# Patient Record
Sex: Female | Born: 1970
Health system: Southern US, Community
[De-identification: ages and names within clinical notes are randomized; demographics above are authoritative.]

## PROBLEM LIST (undated history)

## (undated) ENCOUNTER — Inpatient Hospital Stay (HOSPITAL_COMMUNITY): Payer: Self-pay

## (undated) ENCOUNTER — Ambulatory Visit (HOSPITAL_COMMUNITY)
Admission: EM | Disposition: A | Discharge status: Home / Self Care | Payer: No Typology Code available for payment source

## (undated) DIAGNOSIS — D696 Thrombocytopenia, unspecified: Secondary | ICD-10-CM

## (undated) DIAGNOSIS — IMO0002 Reserved for concepts with insufficient information to code with codable children: Secondary | ICD-10-CM

## (undated) DIAGNOSIS — R87629 Unspecified abnormal cytological findings in specimens from vagina: Secondary | ICD-10-CM

## (undated) DIAGNOSIS — R519 Headache, unspecified: Secondary | ICD-10-CM

## (undated) DIAGNOSIS — K759 Inflammatory liver disease, unspecified: Secondary | ICD-10-CM

## (undated) DIAGNOSIS — K861 Other chronic pancreatitis: Secondary | ICD-10-CM

## (undated) DIAGNOSIS — K760 Fatty (change of) liver, not elsewhere classified: Secondary | ICD-10-CM

## (undated) DIAGNOSIS — K859 Acute pancreatitis without necrosis or infection, unspecified: Secondary | ICD-10-CM

## (undated) HISTORY — DX: Reserved for concepts with insufficient information to code with codable children: IMO0002

## (undated) HISTORY — DX: Headache, unspecified: R51.9

## (undated) HISTORY — PX: COLONOSCOPY: SHX174

## (undated) HISTORY — PX: BREAST EXCISIONAL BIOPSY: SUR124

## (undated) HISTORY — DX: Other chronic pancreatitis: K86.1

## (undated) HISTORY — PX: BREAST SURGERY: SHX581

## (undated) HISTORY — DX: Thrombocytopenia, unspecified: D69.6

## (undated) HISTORY — DX: Unspecified abnormal cytological findings in specimens from vagina: R87.629

## (undated) HISTORY — DX: Fatty (change of) liver, not elsewhere classified: K76.0

---
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
## 1994-02-22 DIAGNOSIS — IMO0002 Reserved for concepts with insufficient information to code with codable children: Secondary | ICD-10-CM

## 1994-02-22 DIAGNOSIS — R87619 Unspecified abnormal cytological findings in specimens from cervix uteri: Secondary | ICD-10-CM

## 1994-02-22 HISTORY — DX: Unspecified abnormal cytological findings in specimens from cervix uteri: R87.619

## 1994-02-22 HISTORY — DX: Reserved for concepts with insufficient information to code with codable children: IMO0002

## 1997-07-18 ENCOUNTER — Ambulatory Visit (HOSPITAL_COMMUNITY): Admission: RE | Admit: 1997-07-18 | Discharge: 1997-07-18 | Payer: Self-pay | Admitting: General Surgery

## 1999-12-14 ENCOUNTER — Encounter: Payer: Self-pay | Admitting: Emergency Medicine

## 1999-12-14 ENCOUNTER — Emergency Department (HOSPITAL_COMMUNITY): Admission: EM | Admit: 1999-12-14 | Discharge: 1999-12-14 | Payer: Self-pay | Admitting: Emergency Medicine

## 1999-12-19 ENCOUNTER — Emergency Department (HOSPITAL_COMMUNITY): Admission: EM | Admit: 1999-12-19 | Discharge: 1999-12-19 | Payer: Self-pay | Admitting: Emergency Medicine

## 1999-12-27 ENCOUNTER — Inpatient Hospital Stay (HOSPITAL_COMMUNITY): Admission: AD | Admit: 1999-12-27 | Discharge: 1999-12-27 | Payer: Self-pay | Admitting: Gynecology

## 2000-01-08 ENCOUNTER — Other Ambulatory Visit: Admission: RE | Admit: 2000-01-08 | Discharge: 2000-01-08 | Payer: Self-pay | Admitting: Gynecology

## 2000-04-12 ENCOUNTER — Ambulatory Visit (HOSPITAL_COMMUNITY): Admission: RE | Admit: 2000-04-12 | Discharge: 2000-04-12 | Payer: Self-pay | Admitting: *Deleted

## 2000-04-13 ENCOUNTER — Encounter: Admission: RE | Admit: 2000-04-13 | Discharge: 2000-04-13 | Payer: Self-pay | Admitting: Obstetrics & Gynecology

## 2000-05-04 ENCOUNTER — Encounter: Admission: RE | Admit: 2000-05-04 | Discharge: 2000-05-04 | Payer: Self-pay | Admitting: Obstetrics & Gynecology

## 2000-05-18 ENCOUNTER — Encounter: Admission: RE | Admit: 2000-05-18 | Discharge: 2000-05-18 | Payer: Self-pay | Admitting: Obstetrics & Gynecology

## 2000-05-30 ENCOUNTER — Ambulatory Visit (HOSPITAL_COMMUNITY): Admission: RE | Admit: 2000-05-30 | Discharge: 2000-05-30 | Payer: Self-pay | Admitting: Obstetrics & Gynecology

## 2000-06-01 ENCOUNTER — Encounter: Admission: RE | Admit: 2000-06-01 | Discharge: 2000-06-01 | Payer: Self-pay | Admitting: Obstetrics & Gynecology

## 2000-06-15 ENCOUNTER — Encounter: Admission: RE | Admit: 2000-06-15 | Discharge: 2000-06-15 | Payer: Self-pay | Admitting: Obstetrics & Gynecology

## 2000-07-06 ENCOUNTER — Encounter: Admission: RE | Admit: 2000-07-06 | Discharge: 2000-07-06 | Payer: Self-pay | Admitting: Obstetrics

## 2000-07-20 ENCOUNTER — Encounter: Admission: RE | Admit: 2000-07-20 | Discharge: 2000-07-20 | Payer: Self-pay | Admitting: Obstetrics & Gynecology

## 2000-07-27 ENCOUNTER — Encounter: Admission: RE | Admit: 2000-07-27 | Discharge: 2000-07-27 | Payer: Self-pay | Admitting: Obstetrics & Gynecology

## 2000-08-01 ENCOUNTER — Inpatient Hospital Stay (HOSPITAL_COMMUNITY): Admission: AD | Admit: 2000-08-01 | Discharge: 2000-08-03 | Payer: Self-pay | Admitting: *Deleted

## 2000-08-20 ENCOUNTER — Inpatient Hospital Stay (HOSPITAL_COMMUNITY): Admission: AD | Admit: 2000-08-20 | Discharge: 2000-08-20 | Payer: Self-pay | Admitting: *Deleted

## 2003-07-24 ENCOUNTER — Encounter (INDEPENDENT_AMBULATORY_CARE_PROVIDER_SITE_OTHER): Payer: Self-pay | Admitting: *Deleted

## 2003-07-24 LAB — CONVERTED CEMR LAB

## 2004-03-30 ENCOUNTER — Ambulatory Visit: Payer: Self-pay | Admitting: Family Medicine

## 2005-01-04 ENCOUNTER — Other Ambulatory Visit: Admission: RE | Admit: 2005-01-04 | Discharge: 2005-01-04 | Payer: Self-pay | Admitting: Gynecology

## 2005-01-26 ENCOUNTER — Ambulatory Visit (HOSPITAL_COMMUNITY): Admission: RE | Admit: 2005-01-26 | Discharge: 2005-01-26 | Payer: Self-pay | Admitting: Gynecology

## 2005-11-04 ENCOUNTER — Encounter (INDEPENDENT_AMBULATORY_CARE_PROVIDER_SITE_OTHER): Payer: Self-pay | Admitting: Specialist

## 2005-11-04 ENCOUNTER — Ambulatory Visit (HOSPITAL_COMMUNITY): Admission: RE | Admit: 2005-11-04 | Discharge: 2005-11-04 | Payer: Self-pay | Admitting: Gastroenterology

## 2006-01-06 ENCOUNTER — Other Ambulatory Visit: Admission: RE | Admit: 2006-01-06 | Discharge: 2006-01-06 | Payer: Self-pay | Admitting: Gynecology

## 2006-04-21 DIAGNOSIS — G44209 Tension-type headache, unspecified, not intractable: Secondary | ICD-10-CM | POA: Insufficient documentation

## 2006-04-21 DIAGNOSIS — E669 Obesity, unspecified: Secondary | ICD-10-CM | POA: Insufficient documentation

## 2006-04-22 ENCOUNTER — Encounter (INDEPENDENT_AMBULATORY_CARE_PROVIDER_SITE_OTHER): Payer: Self-pay | Admitting: *Deleted

## 2006-07-15 ENCOUNTER — Ambulatory Visit (HOSPITAL_COMMUNITY): Admission: RE | Admit: 2006-07-15 | Discharge: 2006-07-15 | Payer: Self-pay | Admitting: Gynecology

## 2009-06-04 ENCOUNTER — Emergency Department (HOSPITAL_COMMUNITY): Admission: EM | Admit: 2009-06-04 | Discharge: 2009-06-04 | Payer: Self-pay | Admitting: Emergency Medicine

## 2009-06-06 ENCOUNTER — Ambulatory Visit: Payer: Self-pay | Admitting: Obstetrics & Gynecology

## 2009-06-06 ENCOUNTER — Ambulatory Visit (HOSPITAL_COMMUNITY): Admission: RE | Admit: 2009-06-06 | Discharge: 2009-06-06 | Payer: Self-pay | Admitting: Obstetrics & Gynecology

## 2009-07-04 ENCOUNTER — Ambulatory Visit: Payer: Self-pay | Admitting: Obstetrics and Gynecology

## 2010-02-22 HISTORY — PX: ECTOPIC PREGNANCY SURGERY: SHX613

## 2010-05-12 LAB — CBC
HCT: 40.4 % (ref 36.0–46.0)
Hemoglobin: 13.4 g/dL (ref 12.0–15.0)
MCHC: 33.2 g/dL (ref 30.0–36.0)
MCV: 92.6 fL (ref 78.0–100.0)
Platelets: 230 10*3/uL (ref 150–400)
RBC: 4.37 MIL/uL (ref 3.87–5.11)
RDW: 12.4 % (ref 11.5–15.5)
WBC: 7.3 10*3/uL (ref 4.0–10.5)

## 2010-05-12 LAB — DIFFERENTIAL
Basophils Absolute: 0 10*3/uL (ref 0.0–0.1)
Basophils Relative: 0 % (ref 0–1)
Eosinophils Absolute: 0.2 10*3/uL (ref 0.0–0.7)
Eosinophils Relative: 3 % (ref 0–5)
Lymphocytes Relative: 31 % (ref 12–46)
Lymphs Abs: 2.3 10*3/uL (ref 0.7–4.0)
Monocytes Absolute: 0.4 10*3/uL (ref 0.1–1.0)
Monocytes Relative: 6 % (ref 3–12)
Neutro Abs: 4.4 10*3/uL (ref 1.7–7.7)
Neutrophils Relative %: 59 % (ref 43–77)

## 2010-05-12 LAB — HCG, QUANTITATIVE, PREGNANCY: hCG, Beta Chain, Quant, S: 3741 m[IU]/mL — ABNORMAL HIGH (ref <5)

## 2010-05-12 LAB — BUN: BUN: 10 mg/dL (ref 6–23)

## 2010-05-12 LAB — AST: AST: 25 U/L (ref 0–37)

## 2010-05-12 LAB — CREATININE, SERUM
Creatinine, Ser: 0.57 mg/dL (ref 0.4–1.2)
GFR calc Af Amer: >60 mL/min (ref >60)
GFR calc non Af Amer: >60 mL/min (ref >60)

## 2010-05-13 LAB — CBC
HCT: 38.8 % (ref 36.0–46.0)
Hemoglobin: 13.6 g/dL (ref 12.0–15.0)
MCHC: 35.1 g/dL (ref 30.0–36.0)
MCV: 92 fL (ref 78.0–100.0)
Platelets: 212 10*3/uL (ref 150–400)
RBC: 4.22 MIL/uL (ref 3.87–5.11)
RDW: 12.4 % (ref 11.5–15.5)
WBC: 8.9 10*3/uL (ref 4.0–10.5)

## 2010-05-13 LAB — WET PREP, GENITAL
Clue Cells Wet Prep HPF POC: NONE SEEN
Trich, Wet Prep: NONE SEEN
WBC, Wet Prep HPF POC: NONE SEEN
Yeast Wet Prep HPF POC: NONE SEEN

## 2010-05-13 LAB — DIFFERENTIAL
Basophils Absolute: 0.1 10*3/uL (ref 0.0–0.1)
Basophils Relative: 1 % (ref 0–1)
Eosinophils Absolute: 0.1 10*3/uL (ref 0.0–0.7)
Eosinophils Relative: 1 % (ref 0–5)
Lymphocytes Relative: 31 % (ref 12–46)
Lymphs Abs: 2.7 10*3/uL (ref 0.7–4.0)
Monocytes Absolute: 0.5 10*3/uL (ref 0.1–1.0)
Monocytes Relative: 6 % (ref 3–12)
Neutro Abs: 5.4 10*3/uL (ref 1.7–7.7)
Neutrophils Relative %: 61 % (ref 43–77)

## 2010-05-13 LAB — POCT PREGNANCY, URINE: Preg Test, Ur: POSITIVE

## 2010-05-13 LAB — URINALYSIS, ROUTINE W REFLEX MICROSCOPIC
Bilirubin Urine: NEGATIVE
Glucose, UA: NEGATIVE mg/dL
Ketones, ur: 40 mg/dL — AB
Leukocytes, UA: NEGATIVE
Nitrite: NEGATIVE
Protein, ur: NEGATIVE mg/dL
Specific Gravity, Urine: 1.015 (ref 1.005–1.030)
Urobilinogen, UA: 0.2 mg/dL (ref 0.0–1.0)
pH: 5 (ref 5.0–8.0)

## 2010-05-13 LAB — ABO/RH: ABO/RH(D): O POS

## 2010-05-13 LAB — URINE MICROSCOPIC-ADD ON

## 2010-05-13 LAB — POCT I-STAT, CHEM 8
BUN: 7 mg/dL (ref 6–23)
Calcium, Ion: 1.11 mmol/L — ABNORMAL LOW (ref 1.12–1.32)
Chloride: 105 mEq/L (ref 96–112)
Creatinine, Ser: 0.4 mg/dL (ref 0.4–1.2)
Glucose, Bld: 89 mg/dL (ref 70–99)
HCT: 43 % (ref 36.0–46.0)
Hemoglobin: 14.6 g/dL (ref 12.0–15.0)
Potassium: 3.6 mEq/L (ref 3.5–5.1)
Sodium: 139 mEq/L (ref 135–145)
TCO2: 21 mmol/L (ref 0–100)

## 2010-05-13 LAB — GC/CHLAMYDIA PROBE AMP, GENITAL
Chlamydia, DNA Probe: NEGATIVE
GC Probe Amp, Genital: NEGATIVE

## 2010-05-13 LAB — HCG, QUANTITATIVE, PREGNANCY: hCG, Beta Chain, Quant, S: 2856 m[IU]/mL — ABNORMAL HIGH (ref <5)

## 2011-04-14 ENCOUNTER — Encounter: Payer: Self-pay | Admitting: Obstetrics and Gynecology

## 2011-04-14 ENCOUNTER — Ambulatory Visit (INDEPENDENT_AMBULATORY_CARE_PROVIDER_SITE_OTHER): Payer: Self-pay | Admitting: Physician Assistant

## 2011-04-14 VITALS — BP 115/75 | HR 103 | Temp 98.1°F | Ht 64.0 in | Wt 169.2 lb

## 2011-04-14 DIAGNOSIS — R102 Pelvic and perineal pain: Secondary | ICD-10-CM

## 2011-04-14 DIAGNOSIS — N949 Unspecified condition associated with female genital organs and menstrual cycle: Secondary | ICD-10-CM

## 2011-04-14 DIAGNOSIS — IMO0001 Reserved for inherently not codable concepts without codable children: Secondary | ICD-10-CM | POA: Insufficient documentation

## 2011-04-14 MED ORDER — NAPROXEN 500 MG PO TABS: 500.0000 mg | ORAL_TABLET | Freq: Two times a day (BID) | ORAL | Status: AC

## 2011-04-14 NOTE — Progress Notes (Signed)
Chief Complaint:  Abdominal Pain   Kelsey Swanson is  41 y.o. [id number].  Patient's last menstrual period was 04/12/2011.Marland Kitchen  Her pregnancy status is negative.  She presents complaining of Abdominal Pain . Onset is described as ongoing and has been present for  6 months. Reports daily sharp, knife-like pain in left groin that radiates down left leg. Pain is constant, daily, unchanged by palpation, movement. Reports that she has not taken any medications for pain or heat. States that at night she recieves minimal relief from placing a pillow under left hip.   Depo for birth control. NL periods lasting 3-4 days, w/o intermenstrual bleeding.  Obstetrical/Gynecological History: OB History    Grav Para Term Preterm Abortions TAB SAB Ect Mult Living   4 2 2  2  1 1  2       Past Medical History: Past Medical History  Diagnosis Date  . Abnormal Pap smear 1996    Past Surgical History: Past Surgical History  Procedure Date  . Ectopic pregnancy surgery 2012  . Breast surgery     right breast    Family History: History reviewed. No pertinent family history.  Social History: History  Substance Use Topics  . Smoking status: Current Everyday Smoker -- 0.3 packs/day for 10 years    Types: Cigarettes  . Smokeless tobacco: Never Used  . Alcohol Use: Yes     socially    Allergies: No Known Allergies   Review of Systems - Negative except what has been reviewed in HPI.  Physical Exam   Blood pressure 115/75, pulse 103, temperature 98.1 F (36.7 C), temperature source Oral, height 5\' 4"  (1.626 m), weight 169 lb 3.2 oz (76.749 kg), last menstrual period 04/12/2011.  General: General appearance - alert, well appearing, and in no distress, oriented to person, place, and time and overweight Mental status - alert, oriented to person, place, and time, normal mood, behavior, speech, dress, motor activity, and thought processes, affect appropriate to mood Abdomen - soft, nontender,  nondistended, no masses or organomegaly Neurological - alert, oriented, normal speech, no focal findings or movement disorder noted, screening mental status exam normal, cranial nerves II through XII intact Focused Gynecological Exam: VULVA: normal appearing vulva with no masses, tenderness or lesions, VAGINA: normal appearing vagina with normal color and discharge, no lesions, CERVIX: normal appearing cervix without discharge or lesions, cervical motion tenderness absent, UTERUS: uterus is normal size, shape, consistency and nontender, ADNEXA: tenderness left without palpable mass, mass present right side, size 3 cm, non tender right    Assessment: 1. Pelvic pain  US Pelvis Complete, Ultrasound non-ob transvaginal     Plan: Rx Naproxen sent to pharmacy RTC in 3 weeks for rechk and results.  Glendia Olshefski E. 04/14/2011,1:28 PM

## 2011-04-16 ENCOUNTER — Ambulatory Visit (HOSPITAL_COMMUNITY)
Admission: RE | Admit: 2011-04-16 | Discharge: 2011-04-16 | Disposition: A | Discharge status: Home / Self Care | Payer: Self-pay | Source: Ambulatory Visit | Attending: Physician Assistant | Admitting: Physician Assistant

## 2011-04-16 DIAGNOSIS — R1032 Left lower quadrant pain: Secondary | ICD-10-CM | POA: Insufficient documentation

## 2011-04-16 DIAGNOSIS — R102 Pelvic and perineal pain: Secondary | ICD-10-CM

## 2011-04-16 DIAGNOSIS — N949 Unspecified condition associated with female genital organs and menstrual cycle: Secondary | ICD-10-CM | POA: Insufficient documentation

## 2011-05-06 ENCOUNTER — Ambulatory Visit (INDEPENDENT_AMBULATORY_CARE_PROVIDER_SITE_OTHER): Payer: Self-pay | Admitting: Physician Assistant

## 2011-05-06 ENCOUNTER — Encounter: Payer: Self-pay | Admitting: Physician Assistant

## 2011-05-06 VITALS — BP 117/79 | HR 100 | Temp 98.8°F | Ht 62.0 in | Wt 164.8 lb

## 2011-05-06 DIAGNOSIS — IMO0001 Reserved for inherently not codable concepts without codable children: Secondary | ICD-10-CM

## 2011-05-06 DIAGNOSIS — R1032 Left lower quadrant pain: Secondary | ICD-10-CM

## 2011-05-06 NOTE — Progress Notes (Signed)
Chief Complaint:  Follow-up and Results   Kelsey Swanson is  41 y.o. [id number].  Patient's last menstrual period was 05/02/2011..    She presents complaining of Follow-up and Results . Pt presents to review [place] results and for follow-up of daily sharp, knife-like pain in left groin that radiates down left leg that she has had for 6 months. Reports that she continues to have the same pain and has been taking Naproxen nightly and that is has helped to relieve pain.  Obstetrical/Gynecological History: OB History    Grav Para Term Preterm Abortions TAB SAB Ect Mult Living   4 2 2  2  1 1  2       Past Medical History: Past Medical History  Diagnosis Date  . Abnormal Pap smear 1996    Past Surgical History: Past Surgical History  Procedure Date  . Ectopic pregnancy surgery 2012  . Breast surgery     right breast    Family History: Family History  Problem Relation Age of Onset  . Diabetes Father     Social History: History  Substance Use Topics  . Smoking status: Current Everyday Smoker -- 0.3 packs/day for 10 years    Types: Cigarettes  . Smokeless tobacco: Never Used  . Alcohol Use: Yes     socially    Allergies: No Known Allergies   (Not in a hospital admission)  Review of Systems - Negative except what has been reviewed in HPI  Physical Exam   Blood pressure 117/79, pulse 100, temperature 98.8 F (37.1 C), temperature source Oral, height 5\' 2"  (1.575 m), weight 164 lb 12.8 oz (74.753 kg), last menstrual period 05/02/2011.  General: General appearance - alert, well appearing, and in no distress, oriented to person, place, and time and overweight Mental status - alert, oriented to person, place, and time, normal mood, behavior, speech, dress, motor activity, and thought processes, affect appropriate to mood Focused Gynecological Exam: examination not indicated  Labs: No results found for this or any previous visit (from the past 24 hour(s)). Imaging Studies:    Ultrasound Non-ob Transvaginal  04/16/2011  *RADIOLOGY REPORT*  Clinical Data: Left lower quadrant pain.  Prior left salpingectomy for ectopic gestation in 2011.  On Depo-Provera.  LMP 04/14/2011.  TRANSABDOMINAL AND TRANSVAGINAL ULTRASOUND OF PELVIS Technique:  Both transabdominal and transvaginal ultrasound examinations of the pelvis were performed. Transabdominal technique was performed for global imaging of the pelvis including uterus, ovaries, adnexal regions, and pelvic cul-de-sac.  Comparison: 06/07/2010   It was necessary to proceed with endovaginal exam following the transabdominal exam to visualize the endometrium and adnexa.  Findings:  Uterus: Demonstrates a sagittal length of 8.7 cm, AP depth of 4.5 cm and a transverse width of 5.2 cm.  A homogeneous echotexture is identified  Endometrium: Appears homogeneously echogenic with an AP width of 3.6 mm.  No areas of focal thickening or heterogeneity are seen and this would correlate with the patient's history of Depo-Provera use.  Right ovary:  Has a normal appearance measuring 3.7 x 2.4 by 3.2 cm  Left ovary: Has a normal appearance measuring 3.3 x 2.3 x 2.2 cm  Other findings: No pelvic fluid or separate adnexal masses are seen.  IMPRESSION: Normal pelvic ultrasound.  Original Report Authenticated By: Bertha Stakes, M.D.   Assessment: Chronic Groin Pain, etiology unknown, no indication Gyn related (NL pelvic US) Desire for pregnancy  Plan: Reviewed NL pelvic US result. Discussed pain unlikely to be related to female organs.  Referral to [facility] for further evaluation of pain Referral to free pap clinic or [facility] for pap  Jayquan Bradsher E. 05/06/2011,4:48 PM

## 2011-05-06 NOTE — Patient Instructions (Addendum)
Embarazo (Pregnancy) Si planea quedar embarazada, es una buena idea concertar una cita de preconcepcin con el mdico para poder lograr un estilo de vida saludable ante de quedar embarazada. Esto incluye dieta, peso, ejercicio, el tomar vitaminas prenatales en especial cido flico (ayuda a prevenir defectos en el cerebro y la mdula espinal), evitar el alcohol, fumar, las drogas ilegales, problemas mdicos (diabetes, convulsiones), historial familiar de problemas genticos, condiciones de trabajo e inmunizaciones. Es mejor tener conocimiento de estas cosas y hacer algo antes de quedar embarazada. Si est embarazada, es necesario que siga ciertas pautas para tener un beb sano. Es muy importante realizar controles prenatales adecuados y seguir las indicaciones del profesional que la asiste. La atencin prenatal incluye toda la asistencia mdica que usted recibe antes del nacimiento del beb. Esto ayuda a prevenir problemas durante el embarazo y el parto. INSTRUCCIONES PARA EL CUIDADO DOMICILIARIO  Comience las consultas prenatales alrededor de la 12 semana de embarazo o lo antes posible. Al principio generalmente se programan cada mes. Se hacen ms frecuentes en los 2 ltimos meses antes del parto. Es importante que concurra a todas las citas con el profesional y siga sus instrucciones con respecto a los medicamentos que deba utilizar, a la actividad fsica y a la dieta.   Durante el embarazo debe obtener nutrientes para usted y para su beb. Consuma una dieta normal y bien balanceada. Elija alimentos como carne, pescado, leche y otros productos lcteos, vegetales, frutas, panes integrales y cereales El profesional le informar cul es el aumento de peso ideal, segn su peso y altura actuales. Beba gran cantidad de lquidos. Trate de beber 8 vasos de lquidos por da.   El alcohol se asocia a cierto nmero de defectos del nacimiento, incluyendo el sndrome de alcoholismo fetal. Lo mejor es evitarlo  completamente El cigarrillo causa nacimientos prematuros y bebs de bajo peso al nacer. El consumo de alcohol y nicotina durante el embarazo tambin aumentan marcadamente la probabilidad de que el nio sea qumicamente dependiente en etapas posteriores de su vida y puede contribuir al sndrome de muerte sbita infantil (SMSI)   No consuma drogas.   Solo tome medicamentos prescriptos o de venta libre que le haya recomendado el profesional. Algunos medicamentos pueden causar problemas genticos y fsicos al beb   Las nuseas matinales pueden aliviarse si come algunas galletitas saladas en la cama. Coma dos galletitas antes de levantarse por la maana.   Las relaciones sexuales pueden continuarse hasta casi el final del embarazo, si no se presentan otros problemas como prdida prematura (antes de tiempo) de lquido amnitico, hemorragia vaginal, dolor durante las relaciones sexuales o dolor abdominal (en el vientre).   Practique ejercicios con regularidad. Consulte con el profesional que la asiste si no sabe con certeza si determinados ejercicios son seguros.   No utilice la baera con agua caliente, baos turcos y saunas. Estos aumentan el riesgo de sufrir un desmayo o de prdida del conocimiento, y as lastimarse usted o el beb. La natacin es un buen ejercicio. Descanse todo lo que pueda e incluya una siesta despus de almorzar siempre que le sea posible, especialmente durante el tercer trimestre.   Evite los olores y las sustancias qumicas txicas.   No use zapatos de tacones altos, podra perder el equilibrio y caer.   No levante objetos de ms de 2,5 kg. Si levanta un objeto, flexione las piernas y los muslos, y no la espalda.   Evite los viajes largos, especialmente en el tercer trimestre.     Si debe viajar fuera de la ciudad o de su estado, lleve una copia de la historia clnica.  SOLICITE ATENCIN MDICA DE INMEDIATO SI:  La temperatura oral se eleva sin motivo por encima de 102 F  (38.9 C) o segn le indique el profesional que lo asiste.   Tiene una prdida de lquido por la vagina. Si sospecha una ruptura de las [place], tmese la temperatura y llame al profesional para informarlo sobre esto.   Observa unas pequeas manchas o una hemorragia vaginal Notifique al profesional acerca de la cantidad y de cuntos apsitos est utilizando.   Contina teniendo nuseas y no obtiene alivio de los [employer] han [place], o vomita sangre o una sustancia similar a la borra del caf.   Presenta un dolor en la zona superior del abdomen.   Siente molestias en el ligamento redondo en la parte abdominal baja. El profesional que la asiste Hydrologist.   Siente pequeas contracciones del tero (matriz)   No siente que el beb se mueve, o percibe menos movimientos que antes.   Siente dolor al [employer].   Brett Fairy hemorragia vaginal anormal.   Tiene diarrea persistente.   Sufre una cefalea grave.   Tiene problemas visuales.   Comienza a sentir debilidad muscular.   Se siente mareada o sufre un desmayo.   Comienza a sentir falta de aire.   Siente dolor en el pecho.   Sufre dolor en la espalda que se irradia hacia la pierna y el pie.   Siente latidos cardacos irregulares o la frecuencia cardaca es muy rpida.   Aumenta excesivamente de peso en un perodo breve (2,5 kg en 3 a 5 das)   Se ve envuelta en una situacin de violencia domstica.  Document Released: 11/18/2004 Document Revised: 01/28/2011 [facility] Patient Information [address] [place], [place].

## 2012-05-29 ENCOUNTER — Other Ambulatory Visit: Payer: Self-pay | Admitting: Family Medicine

## 2012-05-29 DIAGNOSIS — N6311 Unspecified lump in the right breast, upper outer quadrant: Secondary | ICD-10-CM

## 2012-05-29 DIAGNOSIS — N6453 Retraction of nipple: Secondary | ICD-10-CM

## 2012-06-07 ENCOUNTER — Ambulatory Visit
Admission: RE | Admit: 2012-06-07 | Discharge: 2012-06-07 | Disposition: A | Discharge status: Home / Self Care | Payer: No Typology Code available for payment source | Source: Ambulatory Visit | Attending: Family Medicine | Admitting: Family Medicine

## 2012-06-07 DIAGNOSIS — N6311 Unspecified lump in the right breast, upper outer quadrant: Secondary | ICD-10-CM

## 2012-06-07 DIAGNOSIS — N6453 Retraction of nipple: Secondary | ICD-10-CM

## 2013-12-24 ENCOUNTER — Encounter: Payer: Self-pay | Admitting: Physician Assistant

## 2014-11-13 ENCOUNTER — Other Ambulatory Visit: Payer: Self-pay | Admitting: "Women's Health Care

## 2014-11-13 DIAGNOSIS — N6320 Unspecified lump in the left breast, unspecified quadrant: Secondary | ICD-10-CM

## 2014-11-19 ENCOUNTER — Other Ambulatory Visit: Payer: Self-pay

## 2014-11-27 ENCOUNTER — Other Ambulatory Visit: Payer: Self-pay

## 2014-12-04 ENCOUNTER — Telehealth (HOSPITAL_COMMUNITY): Payer: Self-pay | Admitting: *Deleted

## 2014-12-04 NOTE — Telephone Encounter (Signed)
Telephoned patient at home # and left message to return call to [facility]. Also left message on 12/03/2014. Used interpreter Lavon Paganini.

## 2014-12-09 ENCOUNTER — Telehealth (HOSPITAL_COMMUNITY): Payer: Self-pay | Admitting: *Deleted

## 2014-12-09 NOTE — Telephone Encounter (Signed)
Telephoned patient at home # and left message. Also left messages on 10/11 and 10/12 with no response from patient. Used interpreter Lavon Paganini.

## 2014-12-23 ENCOUNTER — Telehealth (HOSPITAL_COMMUNITY): Payer: Self-pay | Admitting: *Deleted

## 2014-12-23 NOTE — Telephone Encounter (Signed)
Telephoned patient at home # and left message to return call to [facility]. Used interpreter Julie Sowell. 

## 2014-12-30 ENCOUNTER — Other Ambulatory Visit (HOSPITAL_COMMUNITY): Payer: Self-pay | Admitting: *Deleted

## 2014-12-30 DIAGNOSIS — N6320 Unspecified lump in the left breast, unspecified quadrant: Secondary | ICD-10-CM

## 2015-01-01 ENCOUNTER — Encounter (HOSPITAL_COMMUNITY): Payer: Self-pay | Admitting: *Deleted

## 2015-01-02 ENCOUNTER — Ambulatory Visit: Admission: RE | Admit: 2015-01-02 | Payer: Self-pay | Source: Ambulatory Visit

## 2015-01-02 ENCOUNTER — Encounter (HOSPITAL_COMMUNITY): Payer: Self-pay

## 2015-01-02 ENCOUNTER — Ambulatory Visit (HOSPITAL_COMMUNITY)
Admission: RE | Admit: 2015-01-02 | Discharge: 2015-01-02 | Disposition: A | Discharge status: Home / Self Care | Payer: Self-pay | Source: Ambulatory Visit | Attending: Obstetrics and Gynecology | Admitting: Obstetrics and Gynecology

## 2015-01-02 ENCOUNTER — Ambulatory Visit: Payer: Self-pay

## 2015-01-02 VITALS — BP 132/86 | Temp 98.6°F | Ht 63.0 in | Wt 161.0 lb

## 2015-01-02 DIAGNOSIS — Z1239 Encounter for other screening for malignant neoplasm of breast: Secondary | ICD-10-CM

## 2015-01-02 DIAGNOSIS — N6325 Unspecified lump in the left breast, overlapping quadrants: Secondary | ICD-10-CM

## 2015-01-02 DIAGNOSIS — N6320 Unspecified lump in the left breast, unspecified quadrant: Secondary | ICD-10-CM

## 2015-01-02 DIAGNOSIS — N6321 Unspecified lump in the left breast, upper outer quadrant: Secondary | ICD-10-CM

## 2015-01-02 NOTE — Patient Instructions (Addendum)
Educational materials on breast self awareness given. Kelsey Swanson how to perform BSE and gave educational materials to take home. Patient did not need a Pap smear today due to last Pap smear was 05/03/2014. Let her know [facility] will cover Pap smears every 3 years unless has a history of abnormal Pap smears. Referred patient to [facility] for a diagnostic mammogram. Appointment scheduled for Monday, January 06, 2015 at 1430. Patient aware of appointment and will be there. Smoking cessation discussed with patient and educational materials given. Referred patient to the [facility] Quitline and gave information about free smoking cessation classes offered at the [facility]. Kelsey Swanson verbalized understanding.  Tristy Udovich, Arvil Chaco, RN 4:06 PM

## 2015-01-02 NOTE — Progress Notes (Signed)
Complaints of a left breast lump x 2 years that is painful. Patient states the lump and pain come and go. Patient rated pain at a 6 out of 10.  Pap Smear:  Pap smear not completed today. Last Pap smear was 12/06/2011 at the free cervical cancer screening at the [facility] and [facility]. Patient referred to the [facility] for colpscopy. Appointment scheduled for Monday, March 20, 2012 at 1245. Per patient has no history of abnormal Pap smears prior to the most recent Pap smear. Pap smear result is scanned in EPIC under media.  Physical exam: Breasts Breasts symmetrical. No skin abnormalities bilateral breasts. Bilateral nipple inversion that per patient is normal for her. No nipple discharge bilateral breasts. No lymphadenopathy. No lumps palpated right breast. Palpated two lumps within the left breat at 9 o'clock 3 cm from the nipple and at 1 o'clock 2 cm from the nipple. Complaints of tenderness when palpated both lumps. Referred patient to [facility] for a diagnostic mammogram. Appointment scheduled for Monday, January 06, 2015 at 1430.     Pelvic/Bimanual No Pap smear completed today since last Pap smear was 05/03/2014. Pap smear not indicated per [facility] guidelines.   Smoking cessation discussed with patient and educational materials given. Referred patient to the [facility] Quitline and gave information about free smoking cessation classes offered at the [facility].  Used interpreter Benjamine Sprague.

## 2015-02-12 ENCOUNTER — Encounter (HOSPITAL_COMMUNITY): Payer: Self-pay | Admitting: *Deleted

## 2015-02-23 NOTE — L&D Delivery Note (Signed)
Patient complete and pushing. SVD of viable female infant over intact perineum. Nuchal cord x1, easily reduced at maternal abdomen . Infant delivered to mom's abdomen. Delayed cord clamping x 1 minute. Cord clamped x 2, cut. Spontaneous cry heard.   Cord blood obtained. Placenta delivered spontaneously and intact. LUS cleared of clot. Fundus firm on exam, pitocin running.  Lacerations: 1st degree perineal tear Suture: repaired with 3.0 vicryl EBL: 50 cc Anesthesia: epidural, local  Apgars: 9/9 Weight: pending, skin to skin  Instrument and sponge count x2 correct.   Rozell Searing, MD 12/16/2015 9:36 AM    OB FELLOW DELIVERY ATTESTATION  I was gloved and present for the delivery in its entirety, and I agree with the above resident's note.    Katherine Basset, DO OB Fellow

## 2015-04-23 ENCOUNTER — Telehealth: Payer: Self-pay

## 2015-04-23 NOTE — Telephone Encounter (Signed)
Called per interpreter Marly Adams to see if interested in [facility]. Left message. 

## 2015-05-24 ENCOUNTER — Inpatient Hospital Stay (HOSPITAL_COMMUNITY)
Admission: AD | Admit: 2015-05-24 | Discharge: 2015-05-25 | Disposition: A | Discharge status: Home / Self Care | Payer: Medicaid Other | Source: Ambulatory Visit | Attending: Obstetrics & Gynecology | Admitting: Obstetrics & Gynecology

## 2015-05-24 ENCOUNTER — Encounter (HOSPITAL_COMMUNITY): Payer: Self-pay | Admitting: *Deleted

## 2015-05-24 ENCOUNTER — Inpatient Hospital Stay (HOSPITAL_COMMUNITY): Payer: Medicaid Other

## 2015-05-24 DIAGNOSIS — O4690 Antepartum hemorrhage, unspecified, unspecified trimester: Secondary | ICD-10-CM

## 2015-05-24 DIAGNOSIS — Z3A01 Less than 8 weeks gestation of pregnancy: Secondary | ICD-10-CM | POA: Insufficient documentation

## 2015-05-24 DIAGNOSIS — O99331 Smoking (tobacco) complicating pregnancy, first trimester: Secondary | ICD-10-CM | POA: Diagnosis not present

## 2015-05-24 DIAGNOSIS — F17210 Nicotine dependence, cigarettes, uncomplicated: Secondary | ICD-10-CM | POA: Insufficient documentation

## 2015-05-24 DIAGNOSIS — O2341 Unspecified infection of urinary tract in pregnancy, first trimester: Secondary | ICD-10-CM | POA: Insufficient documentation

## 2015-05-24 DIAGNOSIS — O09521 Supervision of elderly multigravida, first trimester: Secondary | ICD-10-CM | POA: Diagnosis not present

## 2015-05-24 DIAGNOSIS — O200 Threatened abortion: Secondary | ICD-10-CM | POA: Diagnosis not present

## 2015-05-24 DIAGNOSIS — N898 Other specified noninflammatory disorders of vagina: Secondary | ICD-10-CM | POA: Diagnosis present

## 2015-05-24 LAB — CBC WITH DIFFERENTIAL/PLATELET
Basophils Absolute: 0 10*3/uL (ref 0.0–0.1)
Basophils Relative: 0 %
Eosinophils Absolute: 0.1 10*3/uL (ref 0.0–0.7)
Eosinophils Relative: 1 %
HCT: 34.1 % — ABNORMAL LOW (ref 36.0–46.0)
Hemoglobin: 11.8 g/dL — ABNORMAL LOW (ref 12.0–15.0)
Lymphocytes Relative: 11 %
Lymphs Abs: 1.8 10*3/uL (ref 0.7–4.0)
MCH: 30.7 pg (ref 26.0–34.0)
MCHC: 34.6 g/dL (ref 30.0–36.0)
MCV: 88.8 fL (ref 78.0–100.0)
Monocytes Absolute: 0.7 10*3/uL (ref 0.1–1.0)
Monocytes Relative: 4 %
Neutro Abs: 13.3 10*3/uL — ABNORMAL HIGH (ref 1.7–7.7)
Neutrophils Relative %: 84 %
Platelets: 214 10*3/uL (ref 150–400)
RBC: 3.84 MIL/uL — ABNORMAL LOW (ref 3.87–5.11)
RDW: 12.6 % (ref 11.5–15.5)
WBC: 15.9 10*3/uL — ABNORMAL HIGH (ref 4.0–10.5)

## 2015-05-24 LAB — URINALYSIS, ROUTINE W REFLEX MICROSCOPIC
Glucose, UA: 100 mg/dL — AB
Ketones, ur: NEGATIVE mg/dL
Nitrite: POSITIVE — AB
Protein, ur: >300 mg/dL — AB
Specific Gravity, Urine: 1.025 (ref 1.005–1.030)
pH: 6 (ref 5.0–8.0)

## 2015-05-24 LAB — URINE MICROSCOPIC-ADD ON: Squamous Epithelial / LPF: NONE SEEN

## 2015-05-24 LAB — POCT PREGNANCY, URINE: Preg Test, Ur: POSITIVE — AB

## 2015-05-24 LAB — WET PREP, GENITAL
Clue Cells Wet Prep HPF POC: NONE SEEN
Sperm: NONE SEEN
Trich, Wet Prep: NONE SEEN
Yeast Wet Prep HPF POC: NONE SEEN

## 2015-05-24 NOTE — MAU Note (Signed)
Patient presents stating that she had a +HPT on Friday with c/o a vaginal discharge. States she went to [facility] on Wednesday and was diagnosed with a UTI and given meds. States she stopped taking the meds when she found out she was pregnant. States she has a brown discharge X 3 weeks.

## 2015-05-24 NOTE — MAU Provider Note (Signed)
History  Chief Complaint:  +HPT  and Vaginal Discharge  Kelsey Swanson is a 45 y.o. [phone] female at [redacted]w[redacted]d by LMP presenting w/ report of lower abdominal pain x 3wks, worse on Rt. Went to [facility] and dx w/ UTI, rx'd bactrim, took 2 days worth of 3 day rx, but she was unable to keep down d/t vomiting. Denies n/v of pregnancy, was just w/ antibiotic. Took HPT yesterday which was +. Was not planning pregnancy. Has h/o ruptured Lt ectopic w/ salpingectomy in 2011.  Brownish vaginal d/c began today- denies odor/itching/irritation.   Reports periods are irregular, LMP 3/7 and regular, period prior to that was sometime in January. States she had 'periods throughout her entire pregnancy' with last baby 14 years ago. Is worried b/c she had mammogram in March, is smoking, and drank a lot of etoh a few months ago. Not taking pnv.   Obstetrical History: OB History    Gravida Para Term Preterm AB TAB SAB Ectopic Multiple Living   5 2 2  2  1 1  2       Past Medical History: Past Medical History  Diagnosis Date  . Abnormal Pap smear 1996    Past Surgical History: Past Surgical History  Procedure Laterality Date  . Ectopic pregnancy surgery  2012  . Breast surgery      right breast    Social History: Social History   Social History  . Marital Status: Married    Spouse Name: N/A  . Number of Children: N/A  . Years of Education: N/A   Social History Main Topics  . Smoking status: Current Every Day Smoker -- 0.30 packs/day for 10 years    Types: Cigarettes  . Smokeless tobacco: Never Used  . Alcohol Use: Yes     Comment: socially  . Drug Use: No  . Sexual Activity:    Partners: Male    Birth Control/ Protection: None     Comment: early preg   Other Topics Concern  . None   Social History Narrative    Allergies: No Known Allergies  Prescriptions prior to admission  Medication Sig Dispense Refill Last Dose  . fish oil-omega-3 fatty acids 1000 MG capsule Take 2 g by mouth  daily.   Not Taking  . folic acid (FOLVITE) A999333 MCG tablet Take 400 mcg by mouth daily.   Not Taking  . medroxyPROGESTERone (DEPO-PROVERA) 150 MG/ML injection Inject 150 mg into the muscle every 3 (three) months.   Not Taking  . Multiple Vitamin (MULTIVITAMIN) tablet Take 1 tablet by mouth daily.       Review of Systems  Pertinent pos/neg as indicated in HPI  Physical Exam  Blood pressure 117/71, pulse 78, temperature 98.1 F (36.7 C), temperature source Oral, resp. rate 20, height 5\' 2"  (1.575 m), weight 73.483 kg (162 lb), last menstrual period 04/29/2015. General appearance: alert, cooperative and mild distress Lungs: clear to auscultation bilaterally, normal effort Heart: regular rate and rhythm Abdomen: gravid, soft, tender to palpation Back: No CVAT  Spec exam: cx visually closed, small amt non-odorous brownish mucousy d/c at os + uterine and bilateral adnexal tenderness, R>L Cultures/Specimens: wet prep, gc/ct   MAU Course  UA, urine culture CBC, HCG, ABO Spec/bimanual exam w/ wet prep, gc/ct U/S  Labs:  Results for orders placed or performed during the hospital encounter of 05/24/15 (from the past 24 hour(s))  Urinalysis, Routine w reflex microscopic (not at [facility])     Status: Abnormal  Collection Time: 05/24/15  8:35 PM  Result Value Ref Range   Color, Urine YELLOW YELLOW   APPearance CLOUDY (A) CLEAR   Specific Gravity, Urine 1.025 1.005 - 1.030   pH 6.0 5.0 - 8.0   Glucose, UA 100 (A) NEGATIVE mg/dL   Hgb urine dipstick LARGE (A) NEGATIVE   Bilirubin Urine SMALL (A) NEGATIVE   Ketones, ur NEGATIVE NEGATIVE mg/dL   Protein, ur >300 (A) NEGATIVE mg/dL   Nitrite POSITIVE (A) NEGATIVE   Leukocytes, UA SMALL (A) NEGATIVE  Urine microscopic-add on     Status: Abnormal   Collection Time: 05/24/15  8:35 PM  Result Value Ref Range   Squamous Epithelial / LPF NONE SEEN NONE SEEN   WBC, UA TOO NUMEROUS TO COUNT 0 - 5 WBC/hpf   RBC / HPF TOO NUMEROUS TO COUNT 0 - 5  RBC/hpf   Bacteria, UA RARE (A) NONE SEEN  Pregnancy, urine POC     Status: Abnormal   Collection Time: 05/24/15 10:52 PM  Result Value Ref Range   Preg Test, Ur POSITIVE (A) NEGATIVE  Wet prep, genital     Status: Abnormal   Collection Time: 05/24/15 11:15 PM  Result Value Ref Range   Yeast Wet Prep HPF POC NONE SEEN NONE SEEN   Trich, Wet Prep NONE SEEN NONE SEEN   Clue Cells Wet Prep HPF POC NONE SEEN NONE SEEN   WBC, Wet Prep HPF POC FEW (A) NONE SEEN   Sperm NONE SEEN   CBC with Differential     Status: Abnormal   Collection Time: 05/24/15 11:25 PM  Result Value Ref Range   WBC 15.9 (H) 4.0 - 10.5 K/uL   RBC 3.84 (L) 3.87 - 5.11 MIL/uL   Hemoglobin 11.8 (L) 12.0 - 15.0 g/dL   HCT 34.1 (L) 36.0 - 46.0 %   MCV 88.8 78.0 - 100.0 fL   MCH 30.7 26.0 - 34.0 pg   MCHC 34.6 30.0 - 36.0 g/dL   RDW 12.6 11.5 - 15.5 %   Platelets 214 150 - 400 K/uL   Neutrophils Relative % 84 %   Neutro Abs 13.3 (H) 1.7 - 7.7 K/uL   Lymphocytes Relative 11 %   Lymphs Abs 1.8 0.7 - 4.0 K/uL   Monocytes Relative 4 %   Monocytes Absolute 0.7 0.1 - 1.0 K/uL   Eosinophils Relative 1 %   Eosinophils Absolute 0.1 0.0 - 0.7 K/uL   Basophils Relative 0 %   Basophils Absolute 0.0 0.0 - 0.1 K/uL  hCG, quantitative, pregnancy     Status: Abnormal   Collection Time: 05/24/15 11:25 PM  Result Value Ref Range   hCG, Beta Chain, Quant, S 64876 (H) <5 mIU/mL  ABO/Rh     Status: None (Preliminary result)   Collection Time: 05/24/15 11:25 PM  Result Value Ref Range   ABO/RH(D) O POS     Imaging:  Complete <14wks ob u/s:  +IUGS, +YS, CRL c/w [redacted]w[redacted]d, FHR 171, bilateral ovaries normal, small [place] Assessment and Plan  A:  [redacted]w[redacted]d SIUP  [id number]  Threatened miscarriage  Unplanned pregnancy  UTI  Smoker  AMA  P:  D/C home  Rx keflex qid x 7d for UTI, stop bactrim (only 2 pills left- but has been unable to keep any of it down, urine still +)  Urine culture pending  Begin pnv  Stop smoking, no more  etoh  Reviewed threatened miscarriage warning s/s, reasons to return to care  Pelvic rest until at  least 7d from any vb  Push po fluids  Call [facility] on Monday to schedule new ob appt   Tawnya Crook CNM,WHNP-BC 4/2/201712:44 AM

## 2015-05-25 ENCOUNTER — Encounter (HOSPITAL_COMMUNITY): Payer: Self-pay | Admitting: Women's Health

## 2015-05-25 DIAGNOSIS — O200 Threatened abortion: Secondary | ICD-10-CM | POA: Diagnosis not present

## 2015-05-25 LAB — ABO/RH: ABO/RH(D): O POS

## 2015-05-25 LAB — HCG, QUANTITATIVE, PREGNANCY: hCG, Beta Chain, Quant, S: 64876 m[IU]/mL — ABNORMAL HIGH (ref <5)

## 2015-05-25 MED ORDER — CEPHALEXIN 500 MG PO CAPS: 500.0000 mg | ORAL_CAPSULE | Freq: Four times a day (QID) | ORAL | Status: DC

## 2015-05-25 NOTE — Progress Notes (Signed)
Knute Neu CNM in earlier to discuss test results and d/c plan. Written and verbal d/c instructions given and understanding voiced

## 2015-05-25 NOTE — Discharge Instructions (Signed)
Begin taking a prenatal vitamin daily No sex until bleeding has completely stopped for at least 7 days Stop smoking, no alcohol Come back if pain becomes severe, you begin to have heavy vaginal bleeding, fever/chills or pain in your upper back  Amenaza de aborto (Threatened Miscarriage) La amenaza de aborto se produce cuando hay hemorragia vaginal durante las primeras 20semanas de [place], pero el embarazo no se interrumpe. El [employer] har pruebas para asegurarse de que el embarazo contine. La causa de la hemorragia puede ser desconocida. Este trastorno no significa que Best boy. Sin embargo, Counsellor riesgo de que el embarazo se interrumpa (aborto completo). CUIDADOS EN EL HOGAR   Asegrese de asistir a todas las citas de cuidados prenatales con el mdico.  Descanse lo suficiente.  No tenga relaciones sexuales ni use tampones si tiene hemorragia vaginal.  No se haga duchas vaginales.  No fume ni consuma drogas.  No beba alcohol.  Evite la cafena. SOLICITE AYUDA SI:  Tiene una hemorragia leve de la vagina.  Tiene dolor o clicos abdominales.  Tiene fiebre. SOLICITE AYUDA DE INMEDIATO SI:   Tiene hemorragia abundante de la vagina.  Elimina cogulos de sangre por la vagina.  Tiene mucho dolor en el abdomen o la parte baja de la espalda, clicos abdominales o calambres en la parte baja de la espalda.  Tiene fiebre, escalofros y mucho dolor abdominal. ASEGRESE DE QUE:   Comprende estas instrucciones.  Controlar su afeccin.  Recibir ayuda de inmediato si no mejora o si empeora.   Esta informacin no tiene Marine scientist el consejo del mdico. Asegrese de hacerle al mdico cualquier pregunta que tenga.   Document Released: 03/13/2010 Document Revised: 02/13/2013 Elsevier Interactive Patient Education 2016 [place] e infeccin del tracto urinario (Pregnancy and Urinary Tract Infection) Ardelia Mems infeccin urinaria (IU) puede  ocurrir en cualquier lugar del tracto urinario. La infeccin urinaria puede [employer] utteres, los riones (pielonefritis), la vejiga (cistitis) y Geologist, engineering (uretritis). Todas las mujeres embarazadas deben ser estudiadas para diagnosticar la presencia de bacterias en el tracto urinario. La identificacin y el tratamiento de una infeccin urinaria disminuye el riesgo de un parto prematuro y de Actor infecciones ms graves en la madre y el beb. CAUSAS Las bacterias causan casi todas las infecciones urinarias.  FACTORES DE RIESGO Hay muchos factores que pueden aumentar sus probabilidades de contraer una infeccin urinaria (IU) durante el [place]. Pueden ser:  Lucilla Edin uretra corta.  Falta de aseo y malos hbitos de higiene.  [place].  Obstruccin de la orina en el tracto urinario.  Problemas con los msculos o nervios plvicos.  Diabetes.  Obesidad.  Problemas en la vejiga despus de tener varios hijos.  Antecedentes de infeccin urinaria. SIGNOS Y SNTOMAS   Dolor, ardor o sensacin de ardor al [employer].  Sentir la necesidad de Garment/textile technologist de inmediato [place]).  Prdida del control vesical (incontinencia urinaria).  Orinar con ms frecuencia de lo comn en el embarazo.  Malestar en la zona inferior del abdomen o en la espalda.  Bennie Hind turbia.  Sangre en la orina (hematuria).  Cristy Hilts. Cuando se infectan los riones, los sntomas pueden ser:  Dolor de espalda.  Dolor lateral en el lado derecho ms que en el lado izquierdo.  Cristy Hilts.  Escalofros.  Nuseas.  Vmitos. DIAGNSTICO  Una infeccin del tracto urinario se suele diagnosticar a travs de la orina. A veces se realizan pruebas y procedimientos adicionales. Estos pueden ser:  [employer]  riones, los urteres, la vejiga y Geologist, engineering.  Observar la vejiga con un tubo que ilumina (cistoscopa). TRATAMIENTO Por lo general, las IU pueden tratarse con medicamentos antibiticos.    INSTRUCCIONES PARA EL CUIDADO EN EL HOGAR   Tome slo medicamentos de venta libre o recetados, segn las indicaciones del mdico. Si le han recetado antibiticos, tmelos segn las indicaciones. Tmelos todos, aunque se sienta mejor.  Beba suficiente lquido para Consulting civil engineer orina clara o de color amarillo plido.  No tenga relaciones sexuales hasta que la infeccin haya desaparecido o el mdico la autorice.  Asegrese de [place] [employer] hagan estudios para Hydrographic surveyor una infeccin urinaria durante el [place]. Estas infecciones suelen reaparecer. Para prevenir una infeccin urinaria en el futuro  Practique buenos hbitos higinicos. Siempre debe limpiarse desde adelante hacia atrs. Use el tissue slo una vez.  No retenga la orina. Orine tan pronto como sea posible cuando tenga ganas.  No se haga duchas vaginales ni use desodorantes en aerosol.  Lave con agua tibia y jabn alrededor de la zona genital y el ano.  Vace la vejiga antes y despus de Clinical biochemist.  Use ropa interior con algodn en la entrepierna.  Evite la cafena y las bebidas gaseosas. Estas sustancias irritan la vejiga.  Beba jugo de arndanos o tome comprimidos de arndano. Esto puede disminuir el riesgo de sufrir una infeccin urinaria.  No beba alcohol.  Cumpla con las visitas de control y hgase todos los anlisis segn lo programado. SOLICITE ATENCIN MDICA SI:   Los sntomas empeoran.  Tiene fiebre an despus de 2 das [place].  Tiene una erupcin.  Siente que usted tiene problemas con los medicamentos recetados.  Tiene flujo vaginal anormal. SOLICITE ATENCIN MDICA DE INMEDIATO SI:   Siente dolor en la espalda o a los lados.  Tiene escalofros.  Observa sangre en la orina.  Tiene nuseas o vmitos.  Siente contracciones en el tero.  Tiene una perdida de lquido en chorro por la vagina. ASEGRESE DE QUE:  Comprende estas instrucciones.   Controlar su  afeccin.   Recibir ayuda de inmediato si no mejora o si empeora.    Esta informacin no tiene Marine scientist el consejo del mdico. Asegrese de hacerle al mdico cualquier pregunta que tenga.   Document Released: 11/03/2011 Document Revised: 11/29/2012 Elsevier Interactive Patient Education [employer].

## 2015-05-26 LAB — GC/CHLAMYDIA PROBE AMP (~~LOC~~) NOT AT ARMC
Chlamydia: NEGATIVE
Neisseria Gonorrhea: NEGATIVE

## 2015-05-27 LAB — CULTURE, OB URINE
Culture: >=100000
Special Requests: NORMAL

## 2015-06-03 ENCOUNTER — Encounter (HOSPITAL_COMMUNITY): Payer: Self-pay | Admitting: *Deleted

## 2015-06-10 ENCOUNTER — Inpatient Hospital Stay (HOSPITAL_COMMUNITY)
Admission: AD | Admit: 2015-06-10 | Discharge: 2015-06-10 | Disposition: A | Discharge status: Home / Self Care | Payer: Medicaid Other | Source: Ambulatory Visit | Attending: Family Medicine | Admitting: Family Medicine

## 2015-06-10 ENCOUNTER — Encounter (HOSPITAL_COMMUNITY): Payer: Self-pay | Admitting: *Deleted

## 2015-06-10 DIAGNOSIS — R109 Unspecified abdominal pain: Secondary | ICD-10-CM | POA: Diagnosis not present

## 2015-06-10 DIAGNOSIS — F17210 Nicotine dependence, cigarettes, uncomplicated: Secondary | ICD-10-CM | POA: Diagnosis not present

## 2015-06-10 DIAGNOSIS — O99331 Smoking (tobacco) complicating pregnancy, first trimester: Secondary | ICD-10-CM | POA: Insufficient documentation

## 2015-06-10 DIAGNOSIS — Z3A11 11 weeks gestation of pregnancy: Secondary | ICD-10-CM | POA: Diagnosis not present

## 2015-06-10 DIAGNOSIS — O209 Hemorrhage in early pregnancy, unspecified: Secondary | ICD-10-CM | POA: Insufficient documentation

## 2015-06-10 DIAGNOSIS — Z36 Encounter for antenatal screening of mother: Secondary | ICD-10-CM

## 2015-06-10 DIAGNOSIS — O4691 Antepartum hemorrhage, unspecified, first trimester: Secondary | ICD-10-CM | POA: Diagnosis not present

## 2015-06-10 LAB — CBC
HCT: 37 % (ref 36.0–46.0)
Hemoglobin: 12.6 g/dL (ref 12.0–15.0)
MCH: 30.3 pg (ref 26.0–34.0)
MCHC: 34.1 g/dL (ref 30.0–36.0)
MCV: 88.9 fL (ref 78.0–100.0)
Platelets: 230 10*3/uL (ref 150–400)
RBC: 4.16 MIL/uL (ref 3.87–5.11)
RDW: 12.5 % (ref 11.5–15.5)
WBC: 10.6 10*3/uL — ABNORMAL HIGH (ref 4.0–10.5)

## 2015-06-10 LAB — URINALYSIS, ROUTINE W REFLEX MICROSCOPIC
Bilirubin Urine: NEGATIVE
Glucose, UA: NEGATIVE mg/dL
Ketones, ur: NEGATIVE mg/dL
Leukocytes, UA: NEGATIVE
Nitrite: NEGATIVE
Protein, ur: NEGATIVE mg/dL
Specific Gravity, Urine: 1.01 (ref 1.005–1.030)
pH: 6 (ref 5.0–8.0)

## 2015-06-10 LAB — URINE MICROSCOPIC-ADD ON
Bacteria, UA: NONE SEEN
WBC, UA: NONE SEEN WBC/hpf (ref 0–5)

## 2015-06-10 NOTE — MAU Note (Signed)
Started bleeding about 30 min ago.   Passed large clot. Little cramping

## 2015-06-10 NOTE — MAU Note (Deleted)
EMS arrival, SOB last night. Woke up with bilateral flank pain , noted bleeding, cramping in lower abd. vss.

## 2015-06-10 NOTE — Discharge Instructions (Signed)
Hemorragia vaginal durante el embarazo (primer trimestre) (Vaginal Bleeding During Pregnancy, First Trimester) Durante los primeros meses de Kelsey Swanson, es comn tener una pequea hemorragia vaginal (manchas). A veces, la hemorragia es normal y no representa un problema, pero en algunas ocasiones es un sntoma de algo grave. Asegrese de decirle a su mdico de inmediato si tiene algn tipo de hemorragia vaginal. CUIDADOS EN EL HOGAR  Controle su afeccin para ver si hay cambios.  Siga las indicaciones de su mdico con respecto al [place] de actividad que Kelsey Swanson.  Si debe hacer reposo en cama:  Es posible que deba quedarse en cama y levantarse nicamente para ir al bao.  Quizs le permitan hacer Kelsey Swanson.  Si es necesario, planifique que alguien la ayude.  Alla German:  La cantidad de toallas higinicas que [place] cada da.  La frecuencia con la que se cambia las toallas higinicas.  Indique que tan empapados (saturados) estn.  No use tampones.  No se haga duchas vaginales.  No tenga relaciones sexuales ni orgasmos hasta que el mdico la autorice.  Si elimina tejido por la vagina, gurdelo para mostrrselo al [employer].  Tome los medicamentos solamente como se lo haya indicado el mdico.  No tome aspirina, ya que puede causar hemorragias.  Concurra a todas las visitas de control como se lo haya indicado el mdico. SOLICITE AYUDA SI:   Tiene una hemorragia vaginal.  Tiene clicos.  Tiene dolores de [place].  Tiene fiebre que no desaparece despus de Geophysical data processor. SOLICITE AYUDA DE INMEDIATO SI:   Siente clicos muy intensos en la espalda o en el vientre (abdomen).  Elimina cogulos grandes o tejido por la vagina.  Tiene ms hemorragia.  Se siente dbil o que va a desvanecerse.  Pierde el conocimiento (se desmaya).  Tiene escalofros.  Tiene una prdida importante o sale lquido a borbotones por la vagina.  Se desmaya mientras defeca. ASEGRESE DE  QUE:  Comprende estas instrucciones.  Controlar su afeccin.  Recibir ayuda de inmediato si no mejora o si empeora.   Esta informacin no tiene Marine scientist el consejo del mdico. Asegrese de hacerle al mdico cualquier pregunta que tenga.   Document Released: 06/25/2013 Elsevier Interactive Patient Education 2016 [place]  (Pelvic Rest) El reposo plvico se recomienda a las mujeres cuando:   La placenta cubre parcial o completamente la abertura del cuello del tero (placenta previa).  Hay sangrado entre la pared del tero y el saco amnitico en el primer trimestre (hemorragia subcorinica).  El cuello uterino comienza a abrirse sin iniciarse el trabajo de parto (cuello uterino incompetente, insuficiencia cervical).  El [place] de parto se inicia muy pronto (parto prematuro). INSTRUCCIONES PARA EL CUIDADO EN EL HOGAR   No tenga relaciones sexuales, estimulacin, ni orgasmos.  No use tampones, no se haga duchas vaginales ni coloque ningn objeto en la vagina.  No levante objetos que pesen ms de 10 libras (4,5 kg).  Evite las actividades extenuantes o tensionar los msculos de la pelvis. SOLICITE ATENCIN MDICA SI:   Tiene un sangrado vaginal durante el embarazo. Considrelo como una posible emergencia.  Siente clicos en la zona baja del estmago (ms fuertes que los clicos menstruales).  Nota flujo vaginal (acuoso, con moco o [place]).  Siente un dolor en la espalda leve y sordo.  Tiene contracciones regulares o endurecimiento del tero. SOLICITE ATENCIN MDICA DE INMEDIATO SI:  Observa sangrado vaginal y tiene placenta previa.    Esta informacin no tiene como fin  reemplazar el consejo del mdico. Asegrese de hacerle al mdico cualquier pregunta que tenga.   Document Released: 11/03/2011 Elsevier Interactive Patient Education [employer].

## 2015-06-10 NOTE — MAU Provider Note (Signed)
Chief Complaint: Vaginal Bleeding and Abdominal Cramping   First Provider Initiated Contact with Patient 06/10/15 1249        SUBJECTIVE  HPI: Kelsey Swanson is a 45 y.o. [id number] at [redacted]w[redacted]d by LMP who presents to maternity admissions reporting vaginal bleeding with passage of a clot.  Has not had much cramping at all.  Also complains of a little dizzines at times. Has had congestion. She denies vaginal itching/burning, urinary symptoms, h/a, dizziness, n/v, or fever/chills.    Worried about pregnancy because she did not find out she was pregnant till last visit, and took some "vitamins" and had some alcohol.  Worried about the baby.    Vaginal Bleeding The patient's primary symptoms include vaginal bleeding. The patient's pertinent negatives include no genital itching, genital odor, pelvic pain or vaginal discharge. This is a new problem. The current episode started today. The problem occurs constantly. The problem has been unchanged. The pain is mild. The problem affects both sides. She is pregnant. Pertinent negatives include no abdominal pain, back pain, chills, constipation, diarrhea, dysuria, fever, headaches, nausea or vomiting. The vaginal discharge was bloody. The vaginal bleeding is lighter than menses. She has been passing clots. She has not been passing tissue. Nothing aggravates the symptoms. She has tried nothing for the symptoms. She is not sexually active. She uses nothing for contraception.  RN Note: Started bleeding about 30 min ago. Passed large clot. Little cramping           Past Medical History  Diagnosis Date  . Abnormal Pap smear 1996   Past Surgical History  Procedure Laterality Date  . Ectopic pregnancy surgery  2012  . Breast surgery      right breast   Social History   Social History  . Marital Status: Married    Spouse Name: N/A  . Number of Children: N/A  . Years of Education: N/A   Occupational History  . Not on file.   Social History Main  Topics  . Smoking status: Current Every Day Smoker -- 0.30 packs/day for 10 years    Types: Cigarettes  . Smokeless tobacco: Never Used  . Alcohol Use: Yes     Comment: socially  . Drug Use: No  . Sexual Activity:    Partners: Male    Birth Control/ Protection: None     Comment: early preg   Other Topics Concern  . Not on file   Social History Narrative   No current facility-administered medications on file prior to encounter.   Current Outpatient Prescriptions on File Prior to Encounter  Medication Sig Dispense Refill  . cephALEXin (KEFLEX) 500 MG capsule Take 1 capsule (500 mg total) by mouth 4 (four) times daily. X 7 days 28 capsule 0  . fish oil-omega-3 fatty acids 1000 MG capsule Take 2 g by mouth daily.    . folic acid (FOLVITE) A999333 MCG tablet Take 400 mcg by mouth daily.     No Known Allergies  I have reviewed patient's Past Medical Hx, Surgical Hx, Family Hx, Social Hx, medications and allergies.   ROS:  Review of Systems  Constitutional: Negative for fever, chills and fatigue.  Gastrointestinal: Negative for nausea, vomiting, abdominal pain, diarrhea and constipation.  Genitourinary: Positive for vaginal bleeding. Negative for dysuria, vaginal discharge and pelvic pain.  Musculoskeletal: Negative for back pain.  Neurological: Positive for dizziness. Negative for headaches.   Other systems negative  Physical Exam  Patient Vitals for the past 24 hrs:  BP  Temp Temp src Pulse Resp Height Weight  06/10/15 1235 101/67 mmHg 98.3 F (36.8 C) Oral 95 18 5\' 3"  (1.6 m) 75.388 kg (166 lb 3.2 oz)   Physical Exam  Constitutional: Well-developed, well-nourished female in no acute distress.  Cardiovascular: normal rate Respiratory: normal effort GI: Abd soft, non-tender. Pos BS x 4 MS: Extremities nontender, no edema, normal ROM Neurologic: Alert and oriented x 4.  GU: Neg CVAT.  PELVIC EXAM: Cervix pink, visually closed, without lesion, small bloody discharge, vaginal  walls and external genitalia normal Several small clots removed from vagina Bimanual exam: Cervix 0/long/high, firm, anterior, neg CMT, uterus nontender,  adnexa without tenderness, enlargement, or mass    LAB RESULTS Results for orders placed or performed during the hospital encounter of 06/10/15 (from the past 24 hour(s))  Urinalysis, Routine w reflex microscopic (not at [facility])     Status: Abnormal   Collection Time: 06/10/15 12:37 PM  Result Value Ref Range   Color, Urine AMBER (A) YELLOW   APPearance HAZY (A) CLEAR   Specific Gravity, Urine 1.010 1.005 - 1.030   pH 6.0 5.0 - 8.0   Glucose, UA NEGATIVE NEGATIVE mg/dL   Hgb urine dipstick LARGE (A) NEGATIVE   Bilirubin Urine NEGATIVE NEGATIVE   Ketones, ur NEGATIVE NEGATIVE mg/dL   Protein, ur NEGATIVE NEGATIVE mg/dL   Nitrite NEGATIVE NEGATIVE   Leukocytes, UA NEGATIVE NEGATIVE  Urine microscopic-add on     Status: Abnormal   Collection Time: 06/10/15 12:37 PM  Result Value Ref Range   Squamous Epithelial / LPF 0-5 (A) NONE SEEN   WBC, UA NONE SEEN 0 - 5 WBC/hpf   RBC / HPF TOO NUMEROUS TO COUNT 0 - 5 RBC/hpf   Bacteria, UA NONE SEEN NONE SEEN  CBC     Status: Abnormal   Collection Time: 06/10/15  2:00 PM  Result Value Ref Range   WBC 10.6 (H) 4.0 - 10.5 K/uL   RBC 4.16 3.87 - 5.11 MIL/uL   Hemoglobin 12.6 12.0 - 15.0 g/dL   HCT 37.0 36.0 - 46.0 %   MCV 88.9 78.0 - 100.0 fL   MCH 30.3 26.0 - 34.0 pg   MCHC 34.1 30.0 - 36.0 g/dL   RDW 12.5 11.5 - 15.5 %   Platelets 230 150 - 400 K/uL    Ref. Range 05/24/2015 00:00  Chlamydia Unknown Negative  Neisseria gonorrhea Unknown Negative    Ref. Range 05/24/2015 23:15  Yeast Wet Prep HPF POC Latest Ref Range: NONE SEEN  NONE SEEN  Trich, Wet Prep Latest Ref Range: NONE SEEN  NONE SEEN  Clue Cells Wet Prep HPF POC Latest Ref Range: NONE SEEN  NONE SEEN  WBC, Wet Prep HPF POC Latest Ref Range: NONE SEEN  FEW (A)    --/--/O POS (04/01 2325)  IMAGING Bedside US done Single  gestational sac with active single fetus Approximately 11 weeks in size Fetal heart rate 150 Lots of movement.  MAU Management/MDM: Discussed findings with patient Suspect this is bleeding from the known small subchorionic hemorrhage Reassured patient that fetal movement, development and HR is encouraging of fetal tolerance of bleeding  ASSESSMENT SIUP at [redacted]w[redacted]d  First Trimester bleeding with known subchorionic hemorrhage Live fetus  PLAN Discharge home Pelvic rest Recommend keep appointment as scheduled for new OB  Discussed may continue to have bleeding until [facility] heals over    Medication List    ASK your doctor about these medications  cephALEXin 500 MG capsule  Commonly known as:  KEFLEX  Take 1 capsule (500 mg total) by mouth 4 (four) times daily. X 7 days     fish oil-omega-3 fatty acids 1000 MG capsule  Take 2 g by mouth daily.     folic acid A999333 MCG tablet  Commonly known as:  FOLVITE  Take 400 mcg by mouth daily.        Pt stable at time of discharge. Encouraged to return here or to other Urgent Care/ED if she develops worsening of symptoms, increase in pain, fever, or other concerning symptoms.   Ludger Nutting used for interpretation   Hansel Feinstein CNM, MSN Certified Nurse-Midwife 06/10/2015  12:59 PM

## 2015-06-16 ENCOUNTER — Encounter: Payer: Self-pay | Admitting: *Deleted

## 2015-06-16 ENCOUNTER — Ambulatory Visit: Payer: Self-pay | Admitting: *Deleted

## 2015-06-16 DIAGNOSIS — Z3200 Encounter for pregnancy test, result unknown: Secondary | ICD-10-CM

## 2015-06-16 NOTE — Progress Notes (Signed)
Pt needs proof of pregnancy so that she can go to the [facility]. Pregnancy verification given to patient.  Pregnancy test not ran as patient has had an ultrasound confirming pregnancy.

## 2015-07-01 ENCOUNTER — Encounter: Payer: Self-pay | Admitting: Certified Nurse Midwife

## 2015-07-01 ENCOUNTER — Ambulatory Visit (INDEPENDENT_AMBULATORY_CARE_PROVIDER_SITE_OTHER): Payer: Medicaid Other | Admitting: Certified Nurse Midwife

## 2015-07-01 VITALS — BP 109/72 | HR 91 | Wt 167.0 lb

## 2015-07-01 DIAGNOSIS — Z3201 Encounter for pregnancy test, result positive: Secondary | ICD-10-CM | POA: Diagnosis not present

## 2015-07-01 DIAGNOSIS — N926 Irregular menstruation, unspecified: Secondary | ICD-10-CM

## 2015-07-01 DIAGNOSIS — O09522 Supervision of elderly multigravida, second trimester: Secondary | ICD-10-CM

## 2015-07-01 DIAGNOSIS — O09529 Supervision of elderly multigravida, unspecified trimester: Secondary | ICD-10-CM

## 2015-07-01 LAB — POCT URINALYSIS DIPSTICK
Bilirubin, UA: NEGATIVE
Glucose, UA: NEGATIVE
Ketones, UA: NEGATIVE
Nitrite, UA: NEGATIVE
Protein, UA: NEGATIVE
Spec Grav, UA: 1.01
Urobilinogen, UA: NEGATIVE
pH, UA: 7

## 2015-07-01 LAB — POCT URINE PREGNANCY: Preg Test, Ur: POSITIVE — AB

## 2015-07-01 MED ORDER — VITAFOL FE+ 90-1-200 & 50 MG PO CPPK: 2.0000 | ORAL_CAPSULE | Freq: Every day | ORAL | Status: DC

## 2015-07-01 NOTE — Progress Notes (Signed)
Patient ID: Kelsey Swanson, female   DOB: 09-04-1970, 45 y.o.   MRN: [id number]  Subjective:    Kelsey Swanson is being seen today for her first obstetrical visit.  This is not a planned pregnancy. She is at [redacted]w[redacted]d gestation. Her obstetrical history is significant for advanced maternal age, smoker and FOB>4 years of age, vaginal bleeding in pregnancy, alcohol use. Relationship with FOB: significant other, living together. Patient does intend to breast feed. Pregnancy history fully reviewed.  Hx of vaginal bleeding with previous pregnancy, has had period like bleeding twice with this pregnancy.    The information documented in the HPI was reviewed and verified.  Menstrual History: OB History    Gravida Para Term Preterm AB TAB SAB Ectopic Multiple Living   5 2 2  2  1 1  2       Menarche age: 45 years of age.    Patient's last menstrual period was 04/29/2015.    Past Medical History  Diagnosis Date  . Abnormal Pap smear 1996  . Fatty liver     Past Surgical History  Procedure Laterality Date  . Ectopic pregnancy surgery  2012  . Breast surgery      right breast   Right breast abscess, March 2016, resolved with antibiotics, prior breast lumpectomy in 1990s.      (Not in a hospital admission) No Known Allergies  Social History  Substance Use Topics  . Smoking status: Former Smoker -- 0.30 packs/day for 10 years    Types: Cigarettes    Quit date: 06/07/2015  . Smokeless tobacco: Never Used  . Alcohol Use: No     Comment: socially    Family History  Problem Relation Age of Onset  . Diabetes Father   . Diabetes Maternal Aunt   . Diabetes Maternal Grandmother      Review of Systems Constitutional: negative for weight loss Gastrointestinal: negative for vomiting Genitourinary:negative for genital lesions and vaginal discharge and dysuria, + period like bleeding Musculoskeletal:negative for back pain Behavioral/Psych: negative for abusive relationship, depression,  illegal drug usage and tobacco use    Objective:    BP 109/72 mmHg  Pulse 91  Wt 167 lb (75.751 kg)  LMP 04/29/2015 General Appearance:    Alert, cooperative, no distress, appears stated age  Head:    Normocephalic, without obvious abnormality, atraumatic  Eyes:    PERRL, conjunctiva/corneas clear, EOM's intact, fundi    benign, both eyes  Ears:    Normal TM's and external ear canals, both ears  Nose:   Nares normal, septum midline, mucosa normal, no drainage    or sinus tenderness  Throat:   Lips, mucosa, and tongue normal; teeth and gums normal  Neck:   Supple, symmetrical, trachea midline, no adenopathy;    thyroid:  no enlargement/tenderness/nodules; no carotid   bruit or JVD  Back:     Symmetric, no curvature, ROM normal, no CVA tenderness  Lungs:     Clear to auscultation bilaterally, respirations unlabored  Chest Wall:    No tenderness or deformity   Heart:    Regular rate and rhythm, S1 and S2 normal, no murmur, rub   or gallop  Breast Exam:    No tenderness, masses, or nipple abnormality  Abdomen:     Soft, non-tender, bowel sounds active all four quadrants,    no masses, no organomegaly  Genitalia:    Normal female without lesion, discharge or tenderness  Extremities:   Extremities normal, atraumatic, no cyanosis or edema  Pulses:   2+ and symmetric all extremities  Skin:   Skin color, texture, turgor normal, no rashes or lesions  Lymph nodes:   Cervical, supraclavicular, and axillary nodes normal  Neurologic:   CNII-XII intact, normal strength, sensation and reflexes    throughout                         Cervix:   Long, thick, closed and posterior.  FHR: 136 by doppler.     Lab Review Urine pregnancy test Labs reviewed yes Radiologic studies reviewed yes Assessment:    Pregnancy at [redacted]w[redacted]d weeks   UTI during pregnancy  + alcohol use this pregnancy   AMA   FOB is >55 years of age  Plan:      Prenatal vitamins.  Counseling provided regarding continued use of  seat belts, cessation of alcohol consumption, smoking or use of illicit drugs; infection precautions i.e., influenza/TDAP immunizations, toxoplasmosis,CMV, parvovirus, listeria and varicella; workplace safety, exercise during pregnancy; routine dental care, safe medications, sexual activity, hot tubs, saunas, pools, travel, caffeine use, fish and methlymercury, potential toxins, hair treatments, varicose veins Weight gain recommendations per IOM guidelines reviewed: underweight/BMI< 18.5--> gain 28 - 40 lbs; normal weight/BMI 18.5 - 24.9--> gain 25 - 35 lbs; overweight/BMI 25 - 29.9--> gain 15 - 25 lbs; obese/BMI >30->gain  11 - 20 lbs Problem list reviewed and updated. FIRST/CF mutation testing/NIPT/QUAD SCREEN/fragile X/Ashkenazi Jewish population testing/Spinal muscular atrophy discussed: requested. Role of ultrasound in pregnancy discussed; fetal survey: requested. Amniocentesis discussed: not indicated. VBAC calculator score: VBAC consent form provided No orders of the defined types were placed in this encounter.   Orders Placed This Encounter  Procedures  . POCT Urinalysis Dipstick  . POCT urine pregnancy    Follow up in 4 weeks. 50% of 30 min visit spent on counseling and coordination of care.

## 2015-07-01 NOTE — Addendum Note (Signed)
Addended by: Lewie Loron D on: 07/01/2015 02:47 PM   Modules accepted: Orders

## 2015-07-02 LAB — 726778 7+ALC-UNBUND
Amphetamines, Urine: NEGATIVE ng/mL
Barbiturate Quant, Ur: NEGATIVE ng/mL
Benzodiazepine Quant, Ur: NEGATIVE ng/mL
Cannabinoid Quant, Ur: NEGATIVE ng/mL
Cocaine (Metab.): NEGATIVE ng/mL
Ethanol U, Quan: NEGATIVE %
Opiate Quant, Ur: NEGATIVE ng/mL
PCP Quant, Ur: NEGATIVE ng/mL

## 2015-07-03 LAB — PRENATAL PROFILE I(LABCORP)
Antibody Screen: NEGATIVE
Basophils Absolute: 0 10*3/uL (ref 0.0–0.2)
Basos: 0 %
EOS (ABSOLUTE): 0.1 10*3/uL (ref 0.0–0.4)
Eos: 1 %
Hematocrit: 38.7 % (ref 34.0–46.6)
Hemoglobin: 12.8 g/dL (ref 11.1–15.9)
Hepatitis B Surface Ag: NEGATIVE
Immature Grans (Abs): 0 10*3/uL (ref 0.0–0.1)
Immature Granulocytes: 0 %
Lymphocytes Absolute: 2.1 10*3/uL (ref 0.7–3.1)
Lymphs: 24 %
MCH: 29 pg (ref 26.6–33.0)
MCHC: 33.1 g/dL (ref 31.5–35.7)
MCV: 88 fL (ref 79–97)
Monocytes Absolute: 0.4 10*3/uL (ref 0.1–0.9)
Monocytes: 5 %
Neutrophils Absolute: 6.2 10*3/uL (ref 1.4–7.0)
Neutrophils: 70 %
Platelets: 232 10*3/uL (ref 150–379)
RBC: 4.42 x10E6/uL (ref 3.77–5.28)
RDW: 13.2 % (ref 12.3–15.4)
RPR Ser Ql: NONREACTIVE
Rh Factor: POSITIVE
Rubella Antibodies, IGG: 18.1 index (ref >0.99)
WBC: 8.8 10*3/uL (ref 3.4–10.8)

## 2015-07-03 LAB — HEMOGLOBINOPATHY EVALUATION
HGB C: 0 %
HGB S: 0 %
Hemoglobin A2 Quantitation: 2.7 % (ref 0.7–3.1)
Hemoglobin F Quantitation: 0 % (ref 0.0–2.0)
Hgb A: 97.3 % (ref 94.0–98.0)

## 2015-07-03 LAB — VARICELLA ZOSTER ANTIBODY, IGG: Varicella zoster IgG: 861 index (ref >165)

## 2015-07-03 LAB — HIV ANTIBODY (ROUTINE TESTING W REFLEX): HIV Screen 4th Generation wRfx: NONREACTIVE

## 2015-07-03 LAB — TSH: TSH: 1.52 u[IU]/mL (ref 0.450–4.500)

## 2015-07-04 ENCOUNTER — Other Ambulatory Visit: Payer: Self-pay | Admitting: Certified Nurse Midwife

## 2015-07-04 LAB — PAP IG AND HPV HIGH-RISK
HPV, high-risk: NEGATIVE
PAP Smear Comment: 0

## 2015-07-05 LAB — NUSWAB VG+, CANDIDA 6SP
Candida albicans, NAA: NEGATIVE
Candida glabrata, NAA: NEGATIVE
Candida krusei, NAA: NEGATIVE
Candida lusitaniae, NAA: NEGATIVE
Candida parapsilosis, NAA: NEGATIVE
Candida tropicalis, NAA: NEGATIVE
Chlamydia trachomatis, NAA: NEGATIVE
Neisseria gonorrhoeae, NAA: NEGATIVE
Trich vag by NAA: NEGATIVE

## 2015-07-06 LAB — URINE CULTURE, OB REFLEX

## 2015-07-06 LAB — CULTURE, OB URINE

## 2015-07-07 ENCOUNTER — Other Ambulatory Visit: Payer: Self-pay | Admitting: Certified Nurse Midwife

## 2015-07-07 LAB — MATERNIT GENOME: PDF: 0

## 2015-07-09 ENCOUNTER — Other Ambulatory Visit: Payer: Self-pay | Admitting: Certified Nurse Midwife

## 2015-07-23 ENCOUNTER — Encounter: Payer: Self-pay | Admitting: Medical

## 2015-07-29 ENCOUNTER — Other Ambulatory Visit: Payer: Self-pay | Admitting: Certified Nurse Midwife

## 2015-07-29 ENCOUNTER — Encounter (HOSPITAL_COMMUNITY): Payer: Self-pay

## 2015-07-29 ENCOUNTER — Ambulatory Visit (INDEPENDENT_AMBULATORY_CARE_PROVIDER_SITE_OTHER): Payer: Medicaid Other | Admitting: Certified Nurse Midwife

## 2015-07-29 ENCOUNTER — Ambulatory Visit (HOSPITAL_COMMUNITY)
Admission: RE | Admit: 2015-07-29 | Discharge: 2015-07-29 | Disposition: A | Discharge status: Home / Self Care | Payer: Medicaid Other | Source: Ambulatory Visit | Attending: Certified Nurse Midwife | Admitting: Certified Nurse Midwife

## 2015-07-29 VITALS — BP 94/57 | HR 72 | Temp 98.7°F | Wt 172.0 lb

## 2015-07-29 DIAGNOSIS — Z3689 Encounter for other specified antenatal screening: Secondary | ICD-10-CM

## 2015-07-29 DIAGNOSIS — Z3492 Encounter for supervision of normal pregnancy, unspecified, second trimester: Secondary | ICD-10-CM

## 2015-07-29 DIAGNOSIS — Z3A18 18 weeks gestation of pregnancy: Secondary | ICD-10-CM

## 2015-07-29 DIAGNOSIS — O09522 Supervision of elderly multigravida, second trimester: Secondary | ICD-10-CM | POA: Insufficient documentation

## 2015-07-29 DIAGNOSIS — Z36 Encounter for antenatal screening of mother: Secondary | ICD-10-CM | POA: Diagnosis not present

## 2015-07-29 DIAGNOSIS — O09529 Supervision of elderly multigravida, unspecified trimester: Secondary | ICD-10-CM

## 2015-07-29 LAB — POCT URINALYSIS DIPSTICK
Bilirubin, UA: NEGATIVE
Blood, UA: NEGATIVE
Glucose, UA: NEGATIVE
Ketones, UA: NEGATIVE
Nitrite, UA: NEGATIVE
Protein, UA: NEGATIVE
Spec Grav, UA: 1.01
Urobilinogen, UA: NEGATIVE
pH, UA: 7

## 2015-07-29 NOTE — Progress Notes (Signed)
  Subjective:    Kelsey Swanson is a 45 y.o. female being seen today for her obstetrical visit. She is at [redacted]w[redacted]d gestation. Patient reports: no complaints.  Problem List Items Addressed This Visit    None    Visit Diagnoses    Prenatal care, second trimester    -  Primary    Relevant Orders    POCT urinalysis dipstick (Completed)      Patient Active Problem List   Diagnosis Date Noted  . Supervision of high risk elderly multigravida in second trimester 07/01/2015  . Groin pain, left lower quadrant 04/14/2011  . OBESITY, NOS 04/21/2006  . TENSION HEADACHE 04/21/2006    Objective:     BP 94/57 mmHg  Pulse 72  Temp(Src) 98.7 F (37.1 C)  Wt 172 lb (78.019 kg)  LMP 04/29/2015 Uterine Size: Below umbilicus   FHR: [id number]  Assessment:    Pregnancy @ [redacted]w[redacted]d  weeks Doing well    Plan:    Problem list reviewed and updated. Labs reviewed.  Follow up in 4 weeks. FIRST/CF mutation testing/NIPT/QUAD SCREEN/fragile X/Ashkenazi Jewish population testing/Spinal muscular atrophy discussed: results reviewed. Role of ultrasound in pregnancy discussed; fetal survey: results reviewed. Amniocentesis discussed: not indicated. 50% of 15 minute visit spent on counseling and coordination of care.

## 2015-08-27 ENCOUNTER — Ambulatory Visit (INDEPENDENT_AMBULATORY_CARE_PROVIDER_SITE_OTHER): Payer: Medicaid Other | Admitting: Certified Nurse Midwife

## 2015-08-27 VITALS — BP 107/72 | HR 91 | Wt 177.8 lb

## 2015-08-27 DIAGNOSIS — O09522 Supervision of elderly multigravida, second trimester: Secondary | ICD-10-CM

## 2015-08-27 DIAGNOSIS — Z3482 Encounter for supervision of other normal pregnancy, second trimester: Secondary | ICD-10-CM

## 2015-08-27 LAB — POCT URINALYSIS DIPSTICK
Bilirubin, UA: NEGATIVE
Blood, UA: NEGATIVE
Glucose, UA: 250
Ketones, UA: NEGATIVE
Leukocytes, UA: NEGATIVE
Nitrite, UA: NEGATIVE
Protein, UA: NEGATIVE
Spec Grav, UA: 1.02
Urobilinogen, UA: NEGATIVE
pH, UA: 5

## 2015-08-27 NOTE — Progress Notes (Signed)
Subjective:    Kelsey Swanson is a 45 y.o. female being seen today for her obstetrical visit. She is at [redacted]w[redacted]d gestation. Patient reports: backache, no bleeding, no contractions, no cramping and no leaking . Fetal movement: normal.  Interpreter present for exam.    Problem List Items Addressed This Visit      Other   Supervision of high risk elderly multigravida in second trimester    Other Visit Diagnoses    Encounter for supervision of other normal pregnancy in second trimester    -  Primary    Relevant Orders    POCT urinalysis dipstick      Patient Active Problem List   Diagnosis Date Noted  . Supervision of high risk elderly multigravida in second trimester 07/01/2015  . Groin pain, left lower quadrant 04/14/2011  . OBESITY, NOS 04/21/2006  . TENSION HEADACHE 04/21/2006   Objective:    BP 107/72 mmHg  Pulse 91  Wt 177 lb 12.8 oz (80.65 kg)  LMP 04/29/2015 FHT: 139 BPM  Uterine Size: 21 cm and size equals dates     Assessment:    Pregnancy @ [redacted]w[redacted]d    Pelvic pain of pregnancy  Plan:   RX: ABDOMINAL MATERNITY SUPPORT BELT  OBGCT: discussed. Signs and symptoms of preterm labor: discussed.  Labs, problem list reviewed and updated 2 hr GTT planned Follow up in 4 weeks.

## 2015-09-24 ENCOUNTER — Ambulatory Visit (INDEPENDENT_AMBULATORY_CARE_PROVIDER_SITE_OTHER): Payer: Medicaid Other | Admitting: Obstetrics

## 2015-09-24 ENCOUNTER — Encounter: Payer: Self-pay | Admitting: Obstetrics

## 2015-09-24 VITALS — BP 113/69 | HR 81 | Temp 98.0°F | Wt 183.3 lb

## 2015-09-24 DIAGNOSIS — O09529 Supervision of elderly multigravida, unspecified trimester: Secondary | ICD-10-CM

## 2015-09-24 DIAGNOSIS — Z3493 Encounter for supervision of normal pregnancy, unspecified, third trimester: Secondary | ICD-10-CM

## 2015-09-24 LAB — POCT URINALYSIS DIPSTICK
Bilirubin, UA: NEGATIVE
Blood, UA: NEGATIVE
Glucose, UA: 1000
Ketones, UA: NEGATIVE
Nitrite, UA: NEGATIVE
Protein, UA: NEGATIVE
Spec Grav, UA: <=1.005
Urobilinogen, UA: NEGATIVE
pH, UA: 5

## 2015-09-24 NOTE — Progress Notes (Signed)
Patient is complaining of spasms at night in her legs. She has pain and numbness in both hands- but the pain on the left is also in her arm. It makes it difficult to sleep.

## 2015-09-24 NOTE — Progress Notes (Signed)
Patient ID: Kelsey Swanson, female   DOB: [birth year], 45 y.o.   MRN: [id number] Subjective:    Kelsey Swanson is a 45 y.o. female being seen today for her obstetrical visit. She is at [redacted]w[redacted]d gestation. Patient reports: backache . Fetal movement: normal.  Problem List Items Addressed This Visit    None    Visit Diagnoses    High-risk pregnancy, multigravida of advanced maternal age, antepartum    -  Primary   Prenatal care, third trimester       Relevant Orders   POCT urinalysis dipstick (Completed)     Patient Active Problem List   Diagnosis Date Noted  . Supervision of high risk elderly multigravida in second trimester 07/01/2015  . Groin pain, left lower quadrant 04/14/2011  . OBESITY, NOS 04/21/2006  . TENSION HEADACHE 04/21/2006   Objective:    BP 113/69   Pulse 81   Temp 98 F (36.7 C)   Wt 183 lb 4.8 oz (83.1 kg)   LMP 04/29/2015   BMI 32.47 kg/m  FHT: 150 BPM  Uterine Size: size equals dates     Assessment:    Pregnancy @ [redacted]w[redacted]d    Plan:    OBGCT: ordered for next visit. Signs and symptoms of preterm labor: discussed.  Labs, problem list reviewed and updated 2 hr GTT planned Follow up in 1 weeks.

## 2015-10-01 ENCOUNTER — Ambulatory Visit (INDEPENDENT_AMBULATORY_CARE_PROVIDER_SITE_OTHER): Payer: Medicaid Other | Admitting: Obstetrics

## 2015-10-01 ENCOUNTER — Other Ambulatory Visit: Payer: Medicaid Other

## 2015-10-01 VITALS — BP 98/63 | HR 83 | Temp 98.1°F | Wt 179.6 lb

## 2015-10-01 DIAGNOSIS — Z3493 Encounter for supervision of normal pregnancy, unspecified, third trimester: Secondary | ICD-10-CM

## 2015-10-01 DIAGNOSIS — Z3492 Encounter for supervision of normal pregnancy, unspecified, second trimester: Secondary | ICD-10-CM

## 2015-10-01 NOTE — Progress Notes (Signed)
Pt denies concerns at this time. 

## 2015-10-01 NOTE — Progress Notes (Signed)
Subjective:    Kelsey Swanson is a 45 y.o. female being seen today for her obstetrical visit. She is at [redacted]w[redacted]d gestation. Patient reports: no complaints . Fetal movement: normal.  Problem List Items Addressed This Visit    None    Visit Diagnoses   None.    Patient Active Problem List   Diagnosis Date Noted  . Supervision of high risk elderly multigravida in second trimester 07/01/2015  . Groin pain, left lower quadrant 04/14/2011  . OBESITY, NOS 04/21/2006  . TENSION HEADACHE 04/21/2006   Objective:    BP 98/63   Pulse 83   Temp 98.1 F (36.7 C)   Wt 179 lb 9.6 oz (81.5 kg)   LMP 04/29/2015   BMI 31.81 kg/m  FHT: 150 BPM  Uterine Size: size equals dates     Assessment:    Pregnancy @ [redacted]w[redacted]d    Plan:    OBGCT: ordered. Signs and symptoms of preterm labor: discussed.  Labs, problem list reviewed and updated 2 hr GTT planned Follow up in 2 weeks.

## 2015-10-02 LAB — CBC
Hematocrit: 37.1 % (ref 34.0–46.6)
Hemoglobin: 12.1 g/dL (ref 11.1–15.9)
MCH: 28.6 pg (ref 26.6–33.0)
MCHC: 32.6 g/dL (ref 31.5–35.7)
MCV: 88 fL (ref 79–97)
Platelets: 233 10*3/uL (ref 150–379)
RBC: 4.23 x10E6/uL (ref 3.77–5.28)
RDW: 13.4 % (ref 12.3–15.4)
WBC: 9.4 10*3/uL (ref 3.4–10.8)

## 2015-10-02 LAB — HIV ANTIBODY (ROUTINE TESTING W REFLEX): HIV Screen 4th Generation wRfx: NONREACTIVE

## 2015-10-02 LAB — RPR: RPR Ser Ql: NONREACTIVE

## 2015-10-02 LAB — GLUCOSE TOLERANCE, 2 HOURS W/ 1HR
Glucose, 1 hour: 171 mg/dL (ref 65–179)
Glucose, 2 hour: 133 mg/dL (ref 65–152)
Glucose, Fasting: 81 mg/dL (ref 65–91)

## 2015-10-16 ENCOUNTER — Ambulatory Visit (INDEPENDENT_AMBULATORY_CARE_PROVIDER_SITE_OTHER): Payer: Medicaid Other | Admitting: Obstetrics

## 2015-10-16 ENCOUNTER — Encounter: Payer: Self-pay | Admitting: Obstetrics

## 2015-10-16 VITALS — BP 100/63 | HR 77 | Wt 184.0 lb

## 2015-10-16 DIAGNOSIS — Z3493 Encounter for supervision of normal pregnancy, unspecified, third trimester: Secondary | ICD-10-CM

## 2015-10-16 DIAGNOSIS — Z3483 Encounter for supervision of other normal pregnancy, third trimester: Secondary | ICD-10-CM

## 2015-10-16 DIAGNOSIS — Z87898 Personal history of other specified conditions: Secondary | ICD-10-CM

## 2015-10-16 LAB — POCT URINALYSIS DIPSTICK
Bilirubin, UA: NEGATIVE
Blood, UA: NEGATIVE
Glucose, UA: 50
Ketones, UA: NEGATIVE
Leukocytes, UA: NEGATIVE
Nitrite, UA: NEGATIVE
Protein, UA: NEGATIVE
Spec Grav, UA: 1.01
Urobilinogen, UA: NEGATIVE
pH, UA: 6

## 2015-10-16 NOTE — Progress Notes (Signed)
Patient ID: Kelsey Swanson, female   DOB: 1971/02/08, 45 y.o.   MRN: [id number] Subjective:    Kelsey Swanson is a 45 y.o. female being seen today for her obstetrical visit. She is at [redacted]w[redacted]d gestation. Patient reports right sided pain, off and on. Fetal movement: normal.  Problem List Items Addressed This Visit    None    Visit Diagnoses    Encounter for supervision of other normal pregnancy in third trimester    -  Primary   Relevant Orders   POCT urinalysis dipstick (Completed)   H/O right flank pain       Relevant Orders   Comprehensive metabolic panel     Patient Active Problem List   Diagnosis Date Noted  . Supervision of high risk elderly multigravida in second trimester 07/01/2015  . Groin pain, left lower quadrant 04/14/2011  . OBESITY, NOS 04/21/2006  . TENSION HEADACHE 04/21/2006   Objective:    BP 100/63   Pulse 77   Wt 184 lb (83.5 kg)   LMP 04/29/2015   BMI 32.59 kg/m  FHT:  150 BPM  Uterine Size: size equals dates  Presentation: unsure     Assessment:    Pregnancy @ [redacted]w[redacted]d weeks    Right sided pain.  H/O fatty liver.  Plan:   Check LFT's   labs reviewed, problem list updated Consent signed. GBS sent TDAP offered  Rhogam given for RH negative Pediatrician: discussed. Infant feeding: plans to breastfeed. Maternity leave: discussed. Cigarette smoking: former smoker.  Orders Placed This Encounter  Procedures  . Comprehensive metabolic panel  . POCT urinalysis dipstick   No orders of the defined types were placed in this encounter.  Follow up in 2 Weeks .

## 2015-10-17 LAB — COMPREHENSIVE METABOLIC PANEL
ALT: 8 IU/L (ref 0–32)
AST: 8 IU/L (ref 0–40)
Albumin/Globulin Ratio: 1.7 (ref 1.2–2.2)
Albumin: 3.9 g/dL (ref 3.5–5.5)
Alkaline Phosphatase: 83 IU/L (ref 39–117)
BUN/Creatinine Ratio: 20 (ref 9–23)
BUN: 8 mg/dL (ref 6–24)
Bilirubin Total: <0.2 mg/dL (ref 0.0–1.2)
CO2: 21 mmol/L (ref 18–29)
Calcium: 8.9 mg/dL (ref 8.7–10.2)
Chloride: 103 mmol/L (ref 96–106)
Creatinine, Ser: 0.41 mg/dL — ABNORMAL LOW (ref 0.57–1.00)
GFR calc Af Amer: 147 mL/min/{1.73_m2} (ref >59)
GFR calc non Af Amer: 128 mL/min/{1.73_m2} (ref >59)
Globulin, Total: 2.3 g/dL (ref 1.5–4.5)
Glucose: 92 mg/dL (ref 65–99)
Potassium: 4.2 mmol/L (ref 3.5–5.2)
Sodium: 139 mmol/L (ref 134–144)
Total Protein: 6.2 g/dL (ref 6.0–8.5)

## 2015-10-30 ENCOUNTER — Ambulatory Visit (INDEPENDENT_AMBULATORY_CARE_PROVIDER_SITE_OTHER): Payer: Medicaid Other | Admitting: Certified Nurse Midwife

## 2015-10-30 VITALS — BP 98/62 | HR 93

## 2015-10-30 DIAGNOSIS — O09522 Supervision of elderly multigravida, second trimester: Secondary | ICD-10-CM

## 2015-10-30 DIAGNOSIS — O0973 Supervision of high risk pregnancy due to social problems, third trimester: Secondary | ICD-10-CM | POA: Diagnosis not present

## 2015-10-30 NOTE — Progress Notes (Signed)
Subjective:    Kelsey Swanson is a 45 y.o. female being seen today for her obstetrical visit. She is at [redacted]w[redacted]d gestation. Patient reports no complaints. Fetal movement: normal.  Problem List Items Addressed This Visit    None    Visit Diagnoses    Supervision of high risk pregnancy due to social problems, third trimester    -  Primary   Relevant Orders   [place] MFM OB FOLLOW UP     Patient Active Problem List   Diagnosis Date Noted  . Supervision of high risk elderly multigravida in second trimester 07/01/2015  . Groin pain, left lower quadrant 04/14/2011  . OBESITY, NOS 04/21/2006  . TENSION HEADACHE 04/21/2006   Objective:    BP 98/62   Pulse 93   LMP 04/29/2015  FHT:  145 BPM  Uterine Size: 32 cm and size equals dates  Presentation: cephalic     Assessment:    Pregnancy @ [redacted]w[redacted]d weeks   AMA  H/O substance abuse during pregnancy  Plan:     labs reviewed, problem list updated Consent signed. GBS planning TDAP offered  Rhogam given for RH negative Pediatrician: discussed. Infant feeding: plans to breastfeed. Maternity leave: discussed. Cigarette smoking: quit at start of pregnancy. Orders Placed This Encounter  Procedures  . [place] MFM OB FOLLOW UP    Standing Status:   Future    Standing Expiration Date:   12/29/2016    Order Specific Question:   Reason for Exam (SYMPTOM  OR DIAGNOSIS REQUIRED)    Answer:   f/u growth, AMA    Order Specific Question:   Preferred imaging location?    Answer:   [facility]   No orders of the defined types were placed in this encounter.  Follow up in 2 Weeks.

## 2015-11-03 ENCOUNTER — Encounter (HOSPITAL_COMMUNITY): Payer: Self-pay

## 2015-11-03 ENCOUNTER — Ambulatory Visit (HOSPITAL_COMMUNITY)
Admission: RE | Admit: 2015-11-03 | Discharge: 2015-11-03 | Disposition: A | Discharge status: Home / Self Care | Payer: Medicaid Other | Source: Ambulatory Visit | Attending: Certified Nurse Midwife | Admitting: Certified Nurse Midwife

## 2015-11-03 ENCOUNTER — Other Ambulatory Visit: Payer: Self-pay | Admitting: Certified Nurse Midwife

## 2015-11-03 ENCOUNTER — Other Ambulatory Visit (HOSPITAL_COMMUNITY): Payer: Self-pay | Admitting: *Deleted

## 2015-11-03 DIAGNOSIS — O0973 Supervision of high risk pregnancy due to social problems, third trimester: Secondary | ICD-10-CM | POA: Diagnosis present

## 2015-11-03 DIAGNOSIS — Z3A32 32 weeks gestation of pregnancy: Secondary | ICD-10-CM | POA: Diagnosis not present

## 2015-11-03 DIAGNOSIS — O09523 Supervision of elderly multigravida, third trimester: Secondary | ICD-10-CM

## 2015-11-03 NOTE — ED Notes (Signed)
Pt reports leaking a small amt of watery fluid x 2 weeks, denies bleeding or contractions.

## 2015-11-04 ENCOUNTER — Other Ambulatory Visit: Payer: Self-pay | Admitting: Certified Nurse Midwife

## 2015-11-04 DIAGNOSIS — O09522 Supervision of elderly multigravida, second trimester: Secondary | ICD-10-CM

## 2015-11-13 ENCOUNTER — Ambulatory Visit (INDEPENDENT_AMBULATORY_CARE_PROVIDER_SITE_OTHER): Payer: Medicaid Other | Admitting: Obstetrics and Gynecology

## 2015-11-13 ENCOUNTER — Encounter: Payer: Self-pay | Admitting: *Deleted

## 2015-11-13 VITALS — BP 89/59 | HR 100 | Temp 98.5°F | Wt 190.6 lb

## 2015-11-13 DIAGNOSIS — Z3493 Encounter for supervision of normal pregnancy, unspecified, third trimester: Secondary | ICD-10-CM

## 2015-11-13 DIAGNOSIS — O09529 Supervision of elderly multigravida, unspecified trimester: Secondary | ICD-10-CM | POA: Insufficient documentation

## 2015-11-13 DIAGNOSIS — O09523 Supervision of elderly multigravida, third trimester: Secondary | ICD-10-CM

## 2015-11-13 DIAGNOSIS — R8271 Bacteriuria: Secondary | ICD-10-CM

## 2015-11-13 DIAGNOSIS — Z23 Encounter for immunization: Secondary | ICD-10-CM

## 2015-11-13 DIAGNOSIS — O09522 Supervision of elderly multigravida, second trimester: Secondary | ICD-10-CM

## 2015-11-13 LAB — POCT URINALYSIS DIPSTICK
Bilirubin, UA: NEGATIVE
Blood, UA: NEGATIVE
Glucose, UA: 50
Ketones, UA: NEGATIVE
Leukocytes, UA: NEGATIVE
Nitrite, UA: NEGATIVE
Protein, UA: NEGATIVE
Spec Grav, UA: 1.02
Urobilinogen, UA: NEGATIVE
pH, UA: 5

## 2015-11-13 MED ORDER — TETANUS-DIPHTH-ACELL PERTUSSIS 5-2.5-18.5 LF-MCG/0.5 IM SUSP
0.5000 mL | Freq: Once | INTRAMUSCULAR | Status: AC
  Administered 2015-11-13: 0.5 mL via INTRAMUSCULAR

## 2015-11-13 NOTE — Addendum Note (Signed)
Addended by: Tristan Schroeder D on: 11/13/2015 04:40 PM   Modules accepted: Orders

## 2015-11-13 NOTE — Progress Notes (Signed)
   PRENATAL VISIT NOTE  Subjective:  Kelsey Swanson is a 45 y.o. [id number] at [redacted]w[redacted]d being seen today for ongoing prenatal care.  She is currently monitored for the following issues for this high-risk pregnancy and has OBESITY, NOS; TENSION HEADACHE; Groin pain, left lower quadrant; Supervision of high risk elderly multigravida in second trimester; GBS bacteriuria; and AMA (advanced maternal age) multigravida 35+ on her problem list.  Patient reports no complaints.  Contractions: Not present. Vag. Bleeding: None.  Movement: Present. Denies leaking of fluid.   The following portions of the patient's history were reviewed and updated as appropriate: allergies, current medications, past family history, past medical history, past social history, past surgical history and problem list. Problem list updated.  Objective:   Vitals:   11/13/15 0934  BP: (!) 89/59  Pulse: 100  Temp: 98.5 F (36.9 C)  Weight: 190 lb 9.6 oz (86.5 kg)    Fetal Status: Fetal Heart Rate (bpm): 144 Fundal Height: 34 cm Movement: Present     General:  Alert, oriented and cooperative. Patient is in no acute distress.  Skin: Skin is warm and dry. No rash noted.   Cardiovascular: Normal heart rate noted  Respiratory: Normal respiratory effort, no problems with respiration noted  Abdomen: Soft, gravid, appropriate for gestational age. Pain/Pressure: Present     Pelvic:  Cervical exam deferred        Extremities: Normal range of motion.  Edema: Trace  Mental Status: Normal mood and affect. Normal behavior. Normal judgment and thought content.   Urinalysis:      Assessment and Plan:  Pregnancy: [id number] at [redacted]w[redacted]d  1. Supervision of high risk elderly multigravida in second trimester Patient is doing well without complaints Advised to stretch to help with aches and pains of third trimester Flu vaccine today Tdap also offered  2. GBS bacteriuria Will need prophylaxis in labor  3. AMA (advanced maternal age)  multigravida 58+, third trimester Follow up growth on 10/9 Will start twice weekly testing at 36 weeks  Preterm labor symptoms and general obstetric precautions including but not limited to vaginal bleeding, contractions, leaking of fluid and fetal movement were reviewed in detail with the patient. Please refer to After Visit Summary for other counseling recommendations.  Return in about 2 weeks (around 11/27/2015).  Mora Bellman, MD

## 2015-11-13 NOTE — Progress Notes (Signed)
Patient complain of pain in her lower back when getting up

## 2015-11-27 ENCOUNTER — Ambulatory Visit (INDEPENDENT_AMBULATORY_CARE_PROVIDER_SITE_OTHER): Payer: Medicaid Other | Admitting: Advanced Practice Midwife

## 2015-11-27 ENCOUNTER — Other Ambulatory Visit (HOSPITAL_COMMUNITY)
Admission: RE | Admit: 2015-11-27 | Discharge: 2015-11-27 | Disposition: A | Discharge status: Home / Self Care | Payer: Medicaid Other | Source: Ambulatory Visit | Attending: Advanced Practice Midwife | Admitting: Advanced Practice Midwife

## 2015-11-27 VITALS — BP 112/71 | HR 86 | Temp 98.7°F | Wt 191.0 lb

## 2015-11-27 DIAGNOSIS — O09523 Supervision of elderly multigravida, third trimester: Secondary | ICD-10-CM

## 2015-11-27 DIAGNOSIS — O9A213 Injury, poisoning and certain other consequences of external causes complicating pregnancy, third trimester: Secondary | ICD-10-CM

## 2015-11-27 DIAGNOSIS — Z113 Encounter for screening for infections with a predominantly sexual mode of transmission: Secondary | ICD-10-CM | POA: Insufficient documentation
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
## 2015-11-27 NOTE — Patient Instructions (Signed)
Tercer trimestre de Media planner (Third Trimester of Pregnancy) El tercer trimestre comprende desde la [id number] hasta la [id number], es decir, desde el mes7 hasta el mes9. El tercer trimestre es un perodo en el que el feto crece rpidamente. Hacia el final del noveno mes, el feto mide alrededor de 20pulgadas (45cm) de largo y pesa entre 6 y 52 libras (2,700 y 41,500kg).  CAMBIOS EN EL ORGANISMO Su organismo atraviesa por muchos cambios durante el [place], y estos varan de Ardelia Mems mujer a Theatre manager.   Seguir [employer]. Es de esperar que aumente entre 25 y 35libras (66 y 16kg) hacia el final del Media planner.  Podrn aparecer las primeras [employer] caderas, el abdomen y las [place].  Puede tener necesidad de Garment/textile technologist con ms frecuencia porque el feto baja hacia la pelvis y ejerce presin sobre la vejiga.  Debido al Glennis Brink podr sentir Victorio Palm estomacal con frecuencia.  Puede estar estreida, ya que ciertas hormonas enlentecen los movimientos de los msculos que [employer] desechos a travs de los intestinos.  Pueden aparecer hemorroides o abultarse e hincharse las venas (venas varicosas).  Puede sentir dolor plvico debido al Medtronic y a que las hormonas del Scientist, research (life sciences) las articulaciones entre los huesos de la pelvis. El dolor de espalda puede ser consecuencia de la sobrecarga de los msculos que soportan la [place].  Tal vez haya cambios en el cabello que pueden incluir su engrosamiento, crecimiento rpido y cambios en la textura. Adems, a algunas mujeres se les cae el cabello durante o despus del embarazo, o tienen el cabello seco o fino. Lo ms probable es que el cabello se le normalice despus del nacimiento del beb.  Las [employer] seguirn creciendo y Teaching laboratory technician. A veces, puede haber una secrecin amarilla de las mamas llamada calostro.  El ombligo puede salir hacia afuera.  Puede sentir que le falta el aire debido a que se expande el tero.  Puede notar que el feto  "baja" o lo siente ms bajo, en el abdomen.  Puede tener una prdida de secrecin mucosa con sangre. Esto suele ocurrir en el trmino de unos pocos das a una semana antes de que comience el [place].  El cuello del tero se vuelve delgado y blando (se borra) cerca de la fecha de [place]. QU DEBE ESPERAR EN LOS EXMENES PRENATALES  Le harn exmenes prenatales cada 2semanas hasta la semana36. A partir de ese momento le harn exmenes semanales. Durante una visita prenatal de rutina:  La pesarn para asegurarse de que usted y el feto estn creciendo normalmente.  Le tomarn la presin arterial.  Le medirn el abdomen para controlar el desarrollo del beb.  Se escucharn los latidos cardacos fetales.  Se evaluarn los resultados de los estudios solicitados en visitas anteriores.  Le revisarn el cuello del tero cuando est prxima la fecha de parto para controlar si este se ha borrado. Alrededor de la semana36, el mdico le revisar el cuello del tero. Al mismo tiempo, realizar un anlisis de las secreciones del tejido vaginal. Este examen es para determinar si hay un tipo de bacteria, estreptococo Grupo B. El mdico le explicar esto con ms detalle. El mdico puede preguntarle lo siguiente:  Cmo le gustara que fuera el [place].  Cmo se siente.  Si siente los movimientos del beb.  Si ha tenido sntomas anormales, como prdida de lquido, [place], dolores de cabeza intensos o clicos abdominales.  Si est consumiendo algn producto que contenga tabaco, como cigarrillos, tabaco  de Higher education careers adviser y Psychologist, sport and exercise.  Si tiene [employer]. Otros exmenes o estudios de deteccin que pueden realizarse durante el tercer trimestre incluyen lo siguiente:  Anlisis de sangre para controlar los niveles de hierro (anemia).  Controles fetales para determinar su salud, nivel de [place] y Mining engineer. Si tiene [place] enfermedad o hay problemas durante el embarazo, le harn  estudios.  Prueba del VIH (virus de inmunodeficiencia humana). Si corre Electronics engineer, pueden realizarle una prueba de deteccin del VIH durante el tercer trimestre del embarazo. FALSO TRABAJO DE PARTO Es posible que sienta contracciones leves e irregulares que finalmente desaparecen. Se llaman contracciones de Braxton Hicks o falso trabajo de [place]. Las Yahoo pueden durar horas, das o incluso semanas, antes de que el verdadero trabajo de parto se inicie. Si las contracciones ocurren a intervalos regulares, se intensifican o se hacen dolorosas, lo mejor es que la revise el mdico.  SIGNOS DE TRABAJO DE PARTO   Clicos de tipo menstrual.  Contracciones cada 48mnutos o menos.  Contracciones que comienzan en la parte superior del tero y se extienden hacia abajo, a la zona inferior del abdomen y la espalda.  Sensacin de mayor presin en la pelvis o dolor de espalda.  Una secrecin de mucosidad acuosa o con sangre que sale de la vagina. Si tiene alguno de estos signos antes de la [id number] eMedia planner llame a su mdico de inmediato. Debe concurrir al hospital para que la controlen inmediatamente. INSTRUCCIONES PARA EL CUIDADO EN EL HOGAR   Evite fumar, consumir hierbas, beber alcohol y tomar frmacos que no le hayan recetado. Estas sustancias qumicas afectan la formacin y el desarrollo del beb.  No consuma ningn producto que contenga tabaco, lo que incluye cigarrillos, tabaco de mHigher education careers advisery cPsychologist, sport and exercise Si necesita ayuda para dejar de fumar, consulte al mMeadWestvaco Puede recibir asesoramiento y otro tipo de recursos para dejar de fumar.  [place] en relacin con el uso de medicamentos. Durante el embarazo, hay medicamentos que son seguros de tomar y otros que no.  Haga ejercicio solamente como se lo haya indicado el mdico. Sentir clicos uterinos es un buen signo para dAmbulance personactividad fsica.  Contine comiendo alimentos sanos con  regularidad.  Use un sostn que le brinde buen soporte si le dNordstrom  No se d baos de inmersin en agua caliente, baos turcos ni saunas.  Use el cinturn de seguridad en todo momento mientras conduce.  No coma carne cruda ni queso sin cocinar; evite el contacto con las bandejas sanitarias de los gatos y la tierra que estos animales usan. Estos elementos contienen grmenes que pueden causar defectos congnitos en el beb.  TSebastian  Tome entre 1500 y 20017mde calcio diariamente comenzando en la [id number] embarazo haConvoy Si est estreida, pruebe un laxante suave (si el mdico lo autoriza). Consuma ms alimentos ricos en fibra, como vegetales y frutas frescos y cePsychologist, prison and probation servicesBeba gran cantidad de lquido para mantener la orina de tono claro o color amarillo plido.  Dese baos de asiento con agua tibia para alBest boy las molestias causadas por las hemorroides. Use una crema para las hemorroides si el mdico la autoriza.  Si tiene venas varicosas, use medias de descanso. Eleve los pies durante 1518mtos, 3 o 4veces por da. Limite el consumo de sal en su dieta.  Evite levantar objetos pesados, use zapatos de tacones bajos y manWestern SaharaDescanse  con las piernas elevadas si tiene calambres o dolor de cintura.  Visite a su dentista si no lo ha Quarry manager. Use un cepillo de dientes blando para higienizarse los dientes y psese el hilo dental con suavidad.  Puede seguir [employer], a menos que el mdico le indique lo contrario.  No haga viajes largos excepto que sea absolutamente necesario y solo con la autorizacin del [place] clases prenatales para Development worker, international aid, Psychologist, prison and probation services y hacer preguntas sobre el [place] de parto y [place].  Haga un ensayo de la partida al hospital.  Prepare el bolso que llevar al hospital.  Prepare la habitacin del beb.  Concurra a todas  las visitas prenatales segn las indicaciones de su mdico. SOLICITE ATENCIN MDICA SI:  No est segura de que est en trabajo de parto o de que ha roto la bolsa de las aguas.  Tiene mareos.  Siente clicos leves, presin en la pelvis o dolor persistente en el abdomen.  Tiene nuseas, vmitos o diarrea persistentes.  Margette Fast secrecin vaginal con mal olor.  Siente dolor al [employer]. SOLICITE ATENCIN MDICA DE INMEDIATO SI:   Tiene fiebre.  Tiene una prdida de lquido por la vagina.  Tiene sangrado o pequeas prdidas vaginales.  Siente dolor intenso o clicos en el abdomen.  Sube o baja de peso rpidamente.  Tiene dificultad para respirar y siente dolor de pecho.  Sbitamente se le hinchan mucho el rostro, las [place], los tobillos, los pies o las piernas.  No ha sentido los movimientos del beb durante Leone Brand.  Siente un dolor de cabeza intenso que no se alivia con medicamentos.  Su visin se modifica.   Esta informacin no tiene Marine scientist el consejo del mdico. Asegrese de hacerle al mdico cualquier pregunta que tenga.   Document Released: 11/18/2004 Document Revised: 03/01/2014 Elsevier Interactive Patient Education 2016 Corona debo saber acerca de las lesiones durante el embarazo? (What Do I Need to Know About Injuries During Pregnancy?) Los traumatismos son la causa ms frecuente de lesin y [employer], y tambin pueden causar daos importantes o la muerte del beb. En el vientre (tero), el beb est protegido por una bolsa llena de lquido (saco amnitico). Si se produce un traumatismo directo de alto impacto en el abdomen y la pelvis, el beb puede sufrir daos. Este tipo de traumatismo puede causar el desgarro del tero, la separacin de la placenta de la pared uterina (desprendimiento de la placenta) o la rotura del saco amnitico (ruptura de las [place]). Estas lesiones pueden disminuir o interrumpir la irrigacin  de sangre al beb, o provocar el trabajo de parto antes de lo previsto. Generalmente, las cadas menores y los accidentes automovilsticos de bajo impacto no daan al beb, incluso si usted sufre lesiones muy leves. QU TIPOS DE LESIONES PUEDEN AFECTAR MI [facility]? Las causas ms frecuentes de lesin o muerte de un beb incluyen lo siguiente:  Cadas. Las cadas son ms frecuentes en el segundo y Geologist, engineering trimestre del Media planner. Los factores que aumentan el riesgo de cadas son:  Mickeal Skinner.  Cambio en el centro de gravedad.  Tropezones con un objeto que no puede ver.  Falta de rigidez Nurse, adult) de los ligamentos, lo que provoca movimientos menos coordinados (puede sentirse torpe).  [employer] se realizan actividades de alto riesgo, como equitacin o esqu.  Accidentes automovilsticos. Es importante usar Merchandiser, retail cinturn de seguridad, con el cinturn del regazo por debajo  del abdomen, y conducir siempre con cuidado.  Violencia o agresin domstica.  Quemaduras (por fuego o electricidad). Las causas ms frecuentes de lesiones o muerte de las embarazadas incluyen lo siguiente:  Lesiones que causan una hemorragia grave, shock y la prdida de la irrigacin sangunea a los principales rganos.  Lesiones en la cabeza o el cuello que causan lesiones cerebrales o espinales graves.  Traumatismos en el trax que pueden causar una lesin directa en el corazn y los pulmones, o cualquier lesin que afecta la zona de las [place]. Los traumatismos en estas zonas pueden causar un paro cardiorrespiratorio. QU PUEDO HACER PARA PROTEGERME Y PROTEGER A MI BEB DE LAS LESIONES MIENTRAS ESTOY [place]?  Quite las alfombrillas con las que puede resbalarse y los objetos sueltos en el piso que aumentan el riesgo de tropezones.  No camine sobre pisos mojados o resbalosos.  Use un calzado cmodo con suela de buena adherencia. No use zapatos con tacones altos.  Use siempre el  cinturn de seguridad del modo correcto, con el cinturn del regazo por debajo del abdomen, y [place] con cuidado. No ande en motocicleta mientras est embarazada.  No participe en actividades ni practique deportes de alto impacto.  Evite encender fuego, levantar recipientes pesados que contengan lquidos calientes, o reparar problemas elctricos.  Utilice los medicamentos de venta libre o recetados para Glass blower/designer, Health and safety inspector o la fiebre, segn se lo indique el mdico.  Conozca su tipo de [place] y el tipo de sangre del padre en caso de que tenga una hemorragia vaginal o sufra una lesin debido a la cual sea necesaria una transfusin de [place].  Comunquese con el servicio de emergencias de su localidad (911 en los [place]) si es vctima de violencia o agresin domstica. El abuso a Retail banker puede ser causa significativa de traumatismo durante el [place]. Para obtener ayuda y 58, [place] en contacto con la Lnea directa nacional para la violencia domstica (National Domestic Violence Hotline). [place]?   Se cae sobre el abdomen o sufre un accidente o una lesin muy fuertes.  Sufri una agresin (domstica o de otro tipo).  Tuvo un accidente automovilstico.  Presenta una hemorragia vaginal abundante.  Tiene prdida de lquido por la vagina.  Siente contracciones uterinas (clicos plvicos, dolor o mucho dolor de espalda).  Se siente dbil, se desmaya o presenta vmitos persistentes luego de cualquier traumatismo.  Sufri una [place] grave, por ejemplo, [employer], el cuello, las manos o los genitales, o las quemaduras son ms grandes que el tamao de la palma de la mano en Therapist, music.  Tiene rigidez o dolor en el cuello despus de una cada o debido a otro traumatismo.  Siente dolor de [place] o tiene problemas visuales despus de una cada o algn otro traumatismo.  No siente que el beb se mueve o el  beb no se mueve tanto como antes de sufrir una cada u otro traumatismo.   Esta informacin no tiene Marine scientist el consejo del mdico. Asegrese de hacerle al mdico cualquier pregunta que tenga.   Document Released: 02/08/2005 Document Revised: 03/01/2014 Elsevier Interactive Patient Education [employer].

## 2015-11-28 LAB — CERVICOVAGINAL ANCILLARY ONLY
Chlamydia: NEGATIVE
Neisseria Gonorrhea: NEGATIVE

## 2015-11-29 LAB — STREP GP B NAA: Strep Gp B NAA: POSITIVE — AB

## 2015-12-01 ENCOUNTER — Other Ambulatory Visit (HOSPITAL_COMMUNITY): Payer: Self-pay | Admitting: Maternal and Fetal Medicine

## 2015-12-01 ENCOUNTER — Ambulatory Visit (HOSPITAL_COMMUNITY)
Admission: RE | Admit: 2015-12-01 | Discharge: 2015-12-01 | Disposition: A | Discharge status: Home / Self Care | Payer: Medicaid Other | Source: Ambulatory Visit | Attending: Certified Nurse Midwife | Admitting: Certified Nurse Midwife

## 2015-12-01 ENCOUNTER — Ambulatory Visit (HOSPITAL_COMMUNITY): Payer: Medicaid Other

## 2015-12-01 ENCOUNTER — Encounter (HOSPITAL_COMMUNITY): Payer: Self-pay

## 2015-12-01 DIAGNOSIS — Z3A36 36 weeks gestation of pregnancy: Secondary | ICD-10-CM

## 2015-12-01 DIAGNOSIS — O09523 Supervision of elderly multigravida, third trimester: Secondary | ICD-10-CM | POA: Diagnosis not present

## 2015-12-01 DIAGNOSIS — R8271 Bacteriuria: Secondary | ICD-10-CM

## 2015-12-04 ENCOUNTER — Inpatient Hospital Stay (HOSPITAL_COMMUNITY)
Admission: AD | Admit: 2015-12-04 | Discharge: 2015-12-04 | Disposition: A | Discharge status: Home / Self Care | Payer: Medicaid Other | Source: Ambulatory Visit | Attending: Obstetrics & Gynecology | Admitting: Obstetrics & Gynecology

## 2015-12-04 ENCOUNTER — Encounter (HOSPITAL_COMMUNITY): Payer: Self-pay | Admitting: *Deleted

## 2015-12-04 ENCOUNTER — Ambulatory Visit (INDEPENDENT_AMBULATORY_CARE_PROVIDER_SITE_OTHER): Payer: Medicaid Other | Admitting: Certified Nurse Midwife

## 2015-12-04 VITALS — BP 94/60 | HR 88 | Temp 97.8°F | Wt 191.5 lb

## 2015-12-04 DIAGNOSIS — O09523 Supervision of elderly multigravida, third trimester: Secondary | ICD-10-CM

## 2015-12-04 DIAGNOSIS — Z3689 Encounter for other specified antenatal screening: Secondary | ICD-10-CM

## 2015-12-04 DIAGNOSIS — Z87891 Personal history of nicotine dependence: Secondary | ICD-10-CM | POA: Diagnosis not present

## 2015-12-04 DIAGNOSIS — Z3A36 36 weeks gestation of pregnancy: Secondary | ICD-10-CM | POA: Insufficient documentation

## 2015-12-04 DIAGNOSIS — Z789 Other specified health status: Secondary | ICD-10-CM | POA: Diagnosis not present

## 2015-12-04 LAB — URINE MICROSCOPIC-ADD ON

## 2015-12-04 LAB — URINALYSIS, ROUTINE W REFLEX MICROSCOPIC
Bilirubin Urine: NEGATIVE
Glucose, UA: 500 mg/dL — AB
Hgb urine dipstick: NEGATIVE
Ketones, ur: NEGATIVE mg/dL
Nitrite: NEGATIVE
Protein, ur: NEGATIVE mg/dL
Specific Gravity, Urine: 1.015 (ref 1.005–1.030)
pH: 7 (ref 5.0–8.0)

## 2015-12-04 NOTE — Patient Instructions (Addendum)
Tercer trimestre de Media planner (Third Trimester of Pregnancy) El tercer trimestre comprende desde la [id number] hasta la [id number], es decir, desde el mes7 hasta el mes9. El tercer trimestre es un perodo en el que el feto crece rpidamente. Hacia el final del noveno mes, el feto mide alrededor de 20pulgadas (45cm) de largo y pesa entre 6 y 32 libras (2,700 y 70,500kg).  CAMBIOS EN EL ORGANISMO Su organismo atraviesa por muchos cambios durante el [place], y estos varan de Kelsey Swanson mujer a Kelsey Swanson.   Seguir [employer]. Es de esperar que aumente entre 25 y 35libras (70 y 16kg) hacia el final del Media planner.  Podrn aparecer las primeras [employer] caderas, el abdomen y las [place].  Puede tener necesidad de Garment/textile technologist con ms frecuencia porque el feto baja hacia la pelvis y ejerce presin sobre la vejiga.  Debido al Glennis Brink podr sentir Victorio Palm estomacal con frecuencia.  Puede estar estreida, ya que ciertas hormonas enlentecen los movimientos de los msculos que [employer] desechos a travs de los intestinos.  Pueden aparecer hemorroides o abultarse e hincharse las venas (venas varicosas).  Puede sentir dolor plvico debido al [employer]onic y a que las hormonas del Scientist, research (life sciences) las articulaciones entre los huesos de la pelvis. El dolor de espalda puede ser consecuencia de la sobrecarga de los msculos que soportan la [place].  Tal vez haya cambios en el cabello que pueden incluir su engrosamiento, crecimiento rpido y cambios en la textura. Adems, a algunas mujeres se les cae el cabello durante o despus del embarazo, o tienen el cabello seco o fino. Lo ms probable es que el cabello se le normalice despus del nacimiento del beb.  Las [employer] seguirn creciendo y Teaching laboratory technician. A veces, puede haber una secrecin amarilla de las mamas llamada calostro.  El ombligo puede salir hacia afuera.  Puede sentir que le falta el aire debido a que se expande el tero.  Puede notar que el feto  "baja" o lo siente ms bajo, en el abdomen.  Puede tener una prdida de secrecin mucosa con sangre. Esto suele ocurrir en el trmino de unos pocos das a una semana antes de que comience el [place].  El cuello del tero se vuelve delgado y blando (se borra) cerca de la fecha de [place]. QU DEBE ESPERAR EN LOS EXMENES PRENATALES  Le harn exmenes prenatales cada 2semanas hasta la semana36. A partir de ese momento le harn exmenes semanales. Durante una visita prenatal de rutina:  La pesarn para asegurarse de que usted y el feto estn creciendo normalmente.  Le tomarn la presin arterial.  Le medirn el abdomen para controlar el desarrollo del beb.  Se escucharn los latidos cardacos fetales.  Se evaluarn los resultados de los estudios solicitados en visitas anteriores.  Le revisarn el cuello del tero cuando est prxima la fecha de parto para controlar si este se ha borrado. Alrededor de la semana36, el mdico le revisar el cuello del tero. Al mismo tiempo, realizar un anlisis de las secreciones del tejido vaginal. Este examen es para determinar si hay un tipo de bacteria, estreptococo Grupo B. El mdico le explicar esto con ms detalle. El mdico puede preguntarle lo siguiente:  Cmo le gustara que fuera el [place].  Cmo se siente.  Si siente los movimientos del beb.  Si ha tenido sntomas anormales, como prdida de lquido, [place], dolores de cabeza intensos o clicos abdominales.  Si est consumiendo algn producto que contenga tabaco, como cigarrillos, tabaco  de Higher education careers adviser y Psychologist, sport and exercise.  Si tiene [employer]. Otros exmenes o estudios de deteccin que pueden realizarse durante el tercer trimestre incluyen lo siguiente:  Anlisis de sangre para controlar los niveles de hierro (anemia).  Controles fetales para determinar su salud, nivel de [place] y Mining engineer. Si tiene [place] enfermedad o hay problemas durante el embarazo, le harn  estudios.  Prueba del VIH (virus de inmunodeficiencia humana). Si corre Electronics engineer, pueden realizarle una prueba de deteccin del VIH durante el tercer trimestre del embarazo. FALSO TRABAJO DE PARTO Es posible que sienta contracciones leves e irregulares que finalmente desaparecen. Se llaman contracciones de Braxton Hicks o falso trabajo de [place]. Las Yahoo pueden durar horas, das o incluso semanas, antes de que el verdadero trabajo de parto se inicie. Si las contracciones ocurren a intervalos regulares, se intensifican o se hacen dolorosas, lo mejor es que la revise el mdico.  SIGNOS DE TRABAJO DE PARTO   Clicos de tipo menstrual.  Contracciones cada 48mnutos o menos.  Contracciones que comienzan en la parte superior del tero y se extienden hacia abajo, a la zona inferior del abdomen y la espalda.  Sensacin de mayor presin en la pelvis o dolor de espalda.  Una secrecin de mucosidad acuosa o con sangre que sale de la vagina. Si tiene alguno de estos signos antes de la [id number] eMedia planner llame a su mdico de inmediato. Debe concurrir al hospital para que la controlen inmediatamente. INSTRUCCIONES PARA EL CUIDADO EN EL HOGAR   Evite fumar, consumir hierbas, beber alcohol y tomar frmacos que no le hayan recetado. Estas sustancias qumicas afectan la formacin y el desarrollo del beb.  No consuma ningn producto que contenga tabaco, lo que incluye cigarrillos, tabaco de mHigher education careers advisery cPsychologist, sport and exercise Si necesita ayuda para dejar de fumar, consulte al mMeadWestvaco Puede recibir asesoramiento y otro tipo de recursos para dejar de fumar.  SHardinsburgmdico en relacin con el uso de medicamentos. Durante el embarazo, hay medicamentos que son seguros de tomar y otros que no.  Haga ejercicio solamente como se lo haya indicado el mdico. Sentir clicos uterinos es un buen signo para dAmbulance personactividad fsica.  Contine comiendo alimentos sanos con  regularidad.  Use un sostn que le brinde buen soporte si le dNordstrom  No se d baos de inmersin en agua caliente, baos turcos ni saunas.  Use el cinturn de seguridad en todo momento mientras conduce.  No coma carne cruda ni queso sin cocinar; evite el contacto con las bandejas sanitarias de los gatos y la tierra que estos animales usan. Estos elementos contienen grmenes que pueden causar defectos congnitos en el beb.  TSebastian  Tome entre 1500 y 20017mde calcio diariamente comenzando en la [id number] embarazo haConvoy Si est estreida, pruebe un laxante suave (si el mdico lo autoriza). Consuma ms alimentos ricos en fibra, como vegetales y frutas frescos y cePsychologist, prison and probation servicesBeba gran cantidad de lquido para mantener la orina de tono claro o color amarillo plido.  Dese baos de asiento con agua tibia para alBest boy las molestias causadas por las hemorroides. Use una crema para las hemorroides si el mdico la autoriza.  Si tiene venas varicosas, use medias de descanso. Eleve los pies durante 1518mtos, 3 o 4veces por da. Limite el consumo de sal en su dieta.  Evite levantar objetos pesados, use zapatos de tacones bajos y manWestern SaharaDescanse  con las piernas elevadas si tiene calambres o dolor de cintura.  Visite a su dentista si no lo ha Quarry Swanson. Use un cepillo de dientes blando para higienizarse los dientes y psese el hilo dental con suavidad.  Puede seguir [employer], a menos que el mdico le indique lo contrario.  No haga viajes largos excepto que sea absolutamente necesario y solo con la autorizacin del [place] clases prenatales para Development worker, international aid, Psychologist, prison and probation services y hacer preguntas sobre el [place] de parto y [place].  Haga un ensayo de la partida al hospital.  Prepare el bolso que llevar al hospital.  Prepare la habitacin del beb.  Concurra a todas  las visitas prenatales segn las indicaciones de su mdico. SOLICITE ATENCIN MDICA SI:  No est segura de que est en trabajo de parto o de que ha roto la bolsa de las aguas.  Tiene mareos.  Siente clicos leves, presin en la pelvis o dolor persistente en el abdomen.  Tiene nuseas, vmitos o diarrea persistentes.  Margette Fast secrecin vaginal con mal olor.  Siente dolor al [employer]. SOLICITE ATENCIN MDICA DE INMEDIATO SI:   Tiene fiebre.  Tiene una prdida de lquido por la vagina.  Tiene sangrado o pequeas prdidas vaginales.  Siente dolor intenso o clicos en el abdomen.  Sube o baja de peso rpidamente.  Tiene dificultad para respirar y siente dolor de pecho.  Sbitamente se le hinchan mucho el rostro, las [place], los tobillos, los pies o las piernas.  No ha sentido los movimientos del beb durante Leone Brand.  Siente un dolor de cabeza intenso que no se alivia con medicamentos.  Su visin se modifica.   Esta informacin no tiene Marine scientist el consejo del mdico. Asegrese de hacerle al mdico cualquier pregunta que tenga.   Document Released: 11/18/2004 Document Revised: 03/01/2014 Elsevier Interactive Patient Education [employer].

## 2015-12-04 NOTE — MAU Note (Signed)
Pt sent from office for monitoring for non stress test. PT had an appointment today and had non reactive NST. Denies any pain or bleeding. Reports good fetal movement

## 2015-12-04 NOTE — MAU Provider Note (Signed)
Chief Complaint:  Non-stress Test   None     HPI: Kelsey Swanson is a 45 y.o. [id number] at [redacted]w[redacted]d who presents to maternity admissions sent from the office for nonreactive NST. She has twice weekly testing for advanced maternal age over 85.  She had BPP with 8/8 on Monday, then NST and OB visit today.  She denies any pain today. She reports good fetal movement, denies LOF, vaginal bleeding, vaginal itching/burning, urinary symptoms, h/a, dizziness, n/v, or fever/chills.    HPI  Past Medical History: Past Medical History:  Diagnosis Date  . Abnormal Pap smear 1996  . Fatty liver     Past obstetric history: OB History  Gravida Para Term Preterm AB Living  5 2 2   2 2   SAB TAB Ectopic Multiple Live Births  1   1        # Outcome Date GA Lbr Len/2nd Weight Sex Delivery Anes PTL Lv  5 Current           4 Ectopic           3 SAB           2 Term           1 Term               Past Surgical History: Past Surgical History:  Procedure Laterality Date  . BREAST SURGERY     right breast  . ECTOPIC PREGNANCY SURGERY  2012    Family History: Family History  Problem Relation Age of Onset  . Diabetes Father   . Diabetes Maternal Aunt   . Diabetes Maternal Grandmother     Social History: Social History  Substance Use Topics  . Smoking status: Former Smoker    Packs/day: 0.30    Years: 10.00    Types: Cigarettes    Quit date: 06/07/2015  . Smokeless tobacco: Never Used  . Alcohol use No     Comment: socially    Allergies: No Known Allergies  Meds:  Prescriptions Prior to Admission  Medication Sig Dispense Refill Last Dose  . calcium carbonate (TUMS - DOSED IN MG ELEMENTAL CALCIUM) 500 MG chewable tablet Chew 1 tablet by mouth 2 (two) times daily as needed for indigestion or heartburn.   Past Week at Unknown time  . Prenat-FePoly-Metf-FA-DHA-DSS (VITAFOL FE+) 90-1-200 & 50 MG CPPK Take 2 tablets by mouth daily. 60 each 12 12/04/2015 at Unknown time  . psyllium  (METAMUCIL) 58.6 % packet Take 1 packet by mouth daily.   12/03/2015 at Unknown time    ROS:  Review of Systems  Constitutional: Negative for chills, fatigue and fever.  Eyes: Negative for visual disturbance.  Respiratory: Negative for shortness of breath.   Cardiovascular: Negative for chest pain.  Gastrointestinal: Negative for abdominal pain, nausea and vomiting.  Genitourinary: Negative for difficulty urinating, dysuria, flank pain, pelvic pain, vaginal bleeding, vaginal discharge and vaginal pain.  Neurological: Negative for dizziness and headaches.  Psychiatric/Behavioral: Negative.      I have reviewed patient's Past Medical Hx, Surgical Hx, Family Hx, Social Hx, medications and allergies.   Physical Exam  Patient Vitals for the past 24 hrs:  BP Temp Pulse Resp  12/04/15 1540 115/61 98.2 F (36.8 C) 84 18   Constitutional: Well-developed, well-nourished female in no acute distress.  Cardiovascular: normal rate Respiratory: normal effort GI: Abd soft, non-tender, gravid appropriate for gestational age.  MS: Extremities nontender, no edema, normal ROM Neurologic: Alert and oriented x  4.  GU: Neg CVAT.  PELVIC EXAM: Cervix pink, visually closed, without lesion, scant white creamy discharge, vaginal walls and external genitalia normal Bimanual exam: Cervix 0/long/high, firm, anterior, neg CMT, uterus nontender, nonenlarged, adnexa without tenderness, enlargement, or mass     FHT:  Baseline 130 , moderate variability, accelerations present, no decelerations Contractions: None on toco or to palpation    MAU Course/MDM: I have ordered labs and reviewed results.  NST reviewed Reactive NST in MAU today.  Continue twice weekly testing as scheduled with next visit to MFM on Monday for BPP/NST.   Reviewed s/sx of labor Pt stable at time of discharge.  Assessment: 1. NST (non-stress test) reactive   2. Advanced maternal age in multigravida, third trimester      Plan: Discharge home Labor precautions and fetal kick counts Follow-up [place] .   Specialty:  Maternal and Fetal Medicine Why:  As scheduled on 10/16 Contact information: [address] [id number] [place] [phone]       [place] .   Why:  Next week as scheduled, return to MAU as needed for emergencies Contact information: [address] [place] [phone]           Medication List    TAKE these medications   calcium carbonate 500 MG chewable tablet Commonly known as:  TUMS - dosed in mg elemental calcium Chew 1 tablet by mouth 2 (two) times daily as needed for indigestion or heartburn.   psyllium 58.6 % packet Commonly known as:  METAMUCIL Take 1 packet by mouth daily.   VITAFOL FE+ 90-1-200 & 50 MG Cppk Take 2 tablets by mouth daily.       Fatima Blank Certified Nurse-Midwife 12/04/2015 4:15 PM

## 2015-12-04 NOTE — Discharge Instructions (Signed)
Tercer trimestre de Media planner (Third Trimester of Pregnancy) El tercer trimestre comprende desde la [id number] hasta la [id number], es decir, desde el mes7 hasta el mes9. El tercer trimestre es un perodo en el que el feto crece rpidamente. Hacia el final del noveno mes, el feto mide alrededor de 20pulgadas (45cm) de largo y pesa entre 6 y 6 libras (2,700 y 8,500kg).  CAMBIOS EN EL ORGANISMO Su organismo atraviesa por muchos cambios durante el [place], y estos varan de Ardelia Mems mujer a Theatre manager.   Seguir [employer]. Es de esperar que aumente entre 25 y 35libras (56 y 16kg) hacia el final del Media planner.  Podrn aparecer las primeras [employer] Corporation caderas, el abdomen y las [place].  Puede tener necesidad de Garment/textile technologist con ms frecuencia porque el feto baja hacia la pelvis y ejerce presin sobre la vejiga.  Debido al Glennis Brink podr sentir Victorio Palm estomacal con frecuencia.  Puede estar estreida, ya que ciertas hormonas enlentecen los movimientos de los msculos que [employer] desechos a travs de los intestinos.  Pueden aparecer hemorroides o abultarse e hincharse las venas (venas varicosas).  Puede sentir dolor plvico debido al [employer]onic y a que las hormonas del Scientist, research (life sciences) las articulaciones entre los huesos de la pelvis. El dolor de espalda puede ser consecuencia de la sobrecarga de los msculos que soportan la [place].  Tal vez haya cambios en el cabello que pueden incluir su engrosamiento, crecimiento rpido y cambios en la textura. Adems, a algunas mujeres se les cae el cabello durante o despus del embarazo, o tienen el cabello seco o fino. Lo ms probable es que el cabello se le normalice despus del nacimiento del beb.  Las [employer] seguirn creciendo y Teaching laboratory technician. A veces, puede haber una secrecin amarilla de las mamas llamada calostro.  El ombligo puede salir hacia afuera.  Puede sentir que le falta el aire debido a que se expande el tero.  Puede notar que el feto  "baja" o lo siente ms bajo, en el abdomen.  Puede tener una prdida de secrecin mucosa con sangre. Esto suele ocurrir en el trmino de unos pocos das a una semana antes de que comience el [place] de [place].  El cuello del tero se vuelve delgado y blando (se borra) cerca de la fecha de [place]. QU DEBE ESPERAR EN LOS EXMENES PRENATALES  Le harn exmenes prenatales cada 2semanas hasta la semana36. A partir de ese momento le harn exmenes semanales. Durante una visita prenatal de rutina:  La pesarn para asegurarse de que usted y el feto estn creciendo normalmente.  Le tomarn la presin arterial.  Le medirn el abdomen para controlar el desarrollo del beb.  Se escucharn los latidos cardacos fetales.  Se evaluarn los resultados de los estudios solicitados en visitas anteriores.  Le revisarn el cuello del tero cuando est prxima la fecha de parto para controlar si este se ha borrado. Alrededor de la semana36, el mdico le revisar el cuello del tero. Al mismo tiempo, realizar un anlisis de las secreciones del tejido vaginal. Este examen es para determinar si hay un tipo de bacteria, estreptococo Grupo B. El mdico le explicar esto con ms detalle. El mdico puede preguntarle lo siguiente:  Cmo le gustara que fuera el [place].  Cmo se siente.  Si siente los movimientos del beb.  Si ha tenido sntomas anormales, como prdida de lquido, [place], dolores de cabeza intensos o clicos abdominales.  Si est consumiendo algn producto que contenga tabaco, como cigarrillos, tabaco  de Higher education careers adviser y Psychologist, sport and exercise.  Si tiene [employer]. Otros exmenes o estudios de deteccin que pueden realizarse durante el tercer trimestre incluyen lo siguiente:  Anlisis de sangre para controlar los niveles de hierro (anemia).  Controles fetales para determinar su salud, nivel de [place] y Mining engineer. Si tiene [place] enfermedad o hay problemas durante el embarazo, le harn  estudios.  Prueba del VIH (virus de inmunodeficiencia humana). Si corre Electronics engineer, pueden realizarle una prueba de deteccin del VIH durante el tercer trimestre del embarazo. FALSO TRABAJO DE PARTO Es posible que sienta contracciones leves e irregulares que finalmente desaparecen. Se llaman contracciones de Braxton Hicks o falso trabajo de [place]. Las Yahoo pueden durar horas, das o incluso semanas, antes de que el verdadero trabajo de parto se inicie. Si las contracciones ocurren a intervalos regulares, se intensifican o se hacen dolorosas, lo mejor es que la revise el mdico.  SIGNOS DE TRABAJO DE PARTO   Clicos de tipo menstrual.  Contracciones cada 62minutos o menos.  Contracciones que comienzan en la parte superior del tero y se extienden hacia abajo, a la zona inferior del abdomen y la espalda.  Sensacin de mayor presin en la pelvis o dolor de espalda.  Una secrecin de mucosidad acuosa o con sangre que sale de la vagina. Si tiene alguno de estos signos antes de la [id number] del Media planner, llame a su mdico de inmediato. Debe concurrir al hospital para que la controlen inmediatamente. INSTRUCCIONES PARA EL CUIDADO EN EL HOGAR   Evite fumar, consumir hierbas, beber alcohol y tomar frmacos que no le hayan recetado. Estas sustancias qumicas afectan la formacin y el desarrollo del beb.  No consuma ningn producto que contenga tabaco, lo que incluye cigarrillos, tabaco de Higher education careers adviser y Psychologist, sport and exercise. Si necesita ayuda para dejar de fumar, consulte al [employer]. Puede recibir asesoramiento y otro tipo de recursos para dejar de fumar.  [place] mdico en relacin con el uso de medicamentos. Durante el embarazo, hay medicamentos que son seguros de tomar y otros que no.  Haga ejercicio solamente como se lo haya indicado el mdico. Sentir clicos uterinos es un buen signo para Ambulance person actividad fsica.  Contine comiendo alimentos sanos con  regularidad.  Use un sostn que le brinde buen soporte si le Nordstrom.  No se d baos de inmersin en agua caliente, baos turcos ni saunas.  Use el cinturn de seguridad en todo momento mientras conduce.  No coma carne cruda ni queso sin cocinar; evite el contacto con las bandejas sanitarias de los gatos y la tierra que estos animales usan. Estos elementos contienen grmenes que pueden causar defectos congnitos en el beb.  [place].  Tome entre 1500 y 2000mg  de calcio diariamente comenzando en la [id number] del embarazo [place].  Si est estreida, pruebe un laxante suave (si el mdico lo autoriza). Consuma ms alimentos ricos en fibra, como vegetales y frutas frescos y Psychologist, prison and probation services. Beba gran cantidad de lquido para mantener la orina de tono claro o color amarillo plido.  Dese baos de asiento con agua tibia para Best boy o las molestias causadas por las hemorroides. Use una crema para las hemorroides si el mdico la autoriza.  Si tiene venas varicosas, use medias de descanso. Eleve los pies durante 68minutos, 3 o 4veces por da. Limite el consumo de sal en su dieta.  Evite levantar objetos pesados, use zapatos de tacones bajos y [place].  Descanse  con las piernas elevadas si tiene calambres o dolor de cintura.  Visite a su dentista si no lo ha Quarry manager. Use un cepillo de dientes blando para higienizarse los dientes y psese el hilo dental con suavidad.  Puede seguir [employer], a menos que el mdico le indique lo contrario.  No haga viajes largos excepto que sea absolutamente necesario y solo con la autorizacin del [place] clases prenatales para Development worker, international aid, Psychologist, prison and probation services y hacer preguntas sobre el [place] de parto y [place].  Haga un ensayo de la partida al hospital.  Prepare el bolso que llevar al hospital.  Prepare la habitacin del beb.  Concurra a todas  las visitas prenatales segn las indicaciones de su mdico. SOLICITE ATENCIN MDICA SI:  No est segura de que est en trabajo de parto o de que ha roto la bolsa de las aguas.  Tiene mareos.  Siente clicos leves, presin en la pelvis o dolor persistente en el abdomen.  Tiene nuseas, vmitos o diarrea persistentes.  Margette Fast secrecin vaginal con mal olor.  Siente dolor al [employer] Airlines. SOLICITE ATENCIN MDICA DE INMEDIATO SI:   Tiene fiebre.  Tiene una prdida de lquido por la vagina.  Tiene sangrado o pequeas prdidas vaginales.  Siente dolor intenso o clicos en el abdomen.  Sube o baja de peso rpidamente.  Tiene dificultad para respirar y siente dolor de pecho.  Sbitamente se le hinchan mucho el rostro, las [place], los tobillos, los pies o las piernas.  No ha sentido los movimientos del beb durante Leone Brand.  Siente un dolor de cabeza intenso que no se alivia con medicamentos.  Su visin se modifica.   Esta informacin no tiene Marine scientist el consejo del mdico. Asegrese de hacerle al mdico cualquier pregunta que tenga.   Document Released: 11/18/2004 Document Revised: 03/01/2014 Elsevier Interactive Patient Education [employer].

## 2015-12-04 NOTE — Progress Notes (Signed)
Subjective:    Kelsey Swanson is a 45 y.o. female being seen today for her obstetrical visit. She is at [redacted]w[redacted]d gestation. Patient reports no complaints. Fetal movement: normal.  Here for exam with interpreter.    Problem List Items Addressed This Visit      Other   Supervision of high risk elderly multigravida in second trimester    Other Visit Diagnoses    AMA (advanced maternal age) multigravida 6+, third trimester    -  Primary     Patient Active Problem List   Diagnosis Date Noted  . GBS bacteriuria 11/13/2015  . AMA (advanced maternal age) multigravida 35+ 11/13/2015  . Supervision of high risk elderly multigravida in second trimester 07/01/2015  . OBESITY, NOS 04/21/2006   Objective:    BP 94/60   Pulse 88   Temp 97.8 F (36.6 C)   Wt 191 lb 8 oz (86.9 kg)   LMP 04/29/2015   BMI 33.92 kg/m  FHT:  152 BPM  Uterine Size: 37 cm and size equals dates  Presentation: cephalic   NST: minimal accels, no 15X15, no contractions, no decels.    Assessment:    Pregnancy @ [redacted]w[redacted]d weeks   AMA  Non-reactive NST  maternal obesity  Plan:    Sent to MAU for non-reactive NST   labs reviewed, problem list updated Consent signed. GBS sent TDAP offered  Rhogam given for RH negative Pediatrician: discussed. Infant feeding: plans to bottle feed. Maternity leave: n/a. Cigarette smoking: never smoked. No orders of the defined types were placed in this encounter.  No orders of the defined types were placed in this encounter.  Follow up in 1 Week.

## 2015-12-08 ENCOUNTER — Encounter (HOSPITAL_COMMUNITY): Payer: Self-pay

## 2015-12-08 ENCOUNTER — Ambulatory Visit (HOSPITAL_COMMUNITY)
Admission: RE | Admit: 2015-12-08 | Discharge: 2015-12-08 | Disposition: A | Discharge status: Home / Self Care | Payer: Medicaid Other | Source: Ambulatory Visit | Attending: Obstetrics | Admitting: Obstetrics

## 2015-12-08 DIAGNOSIS — R8271 Bacteriuria: Secondary | ICD-10-CM

## 2015-12-08 DIAGNOSIS — O09523 Supervision of elderly multigravida, third trimester: Secondary | ICD-10-CM | POA: Diagnosis present

## 2015-12-08 DIAGNOSIS — Z3A37 37 weeks gestation of pregnancy: Secondary | ICD-10-CM | POA: Insufficient documentation

## 2015-12-12 ENCOUNTER — Ambulatory Visit (INDEPENDENT_AMBULATORY_CARE_PROVIDER_SITE_OTHER): Payer: Medicaid Other | Admitting: Certified Nurse Midwife

## 2015-12-12 ENCOUNTER — Inpatient Hospital Stay (HOSPITAL_COMMUNITY): Payer: Medicaid Other

## 2015-12-12 ENCOUNTER — Encounter (HOSPITAL_COMMUNITY): Payer: Self-pay

## 2015-12-12 ENCOUNTER — Inpatient Hospital Stay (HOSPITAL_COMMUNITY)
Admission: AD | Admit: 2015-12-12 | Discharge: 2015-12-12 | Disposition: A | Discharge status: Home / Self Care | Payer: Medicaid Other | Source: Ambulatory Visit | Attending: Obstetrics & Gynecology | Admitting: Obstetrics & Gynecology

## 2015-12-12 VITALS — BP 109/63 | HR 82 | Temp 98.1°F | Wt 192.6 lb

## 2015-12-12 DIAGNOSIS — Z87891 Personal history of nicotine dependence: Secondary | ICD-10-CM | POA: Diagnosis not present

## 2015-12-12 DIAGNOSIS — Z3A38 38 weeks gestation of pregnancy: Secondary | ICD-10-CM | POA: Diagnosis not present

## 2015-12-12 DIAGNOSIS — Z789 Other specified health status: Secondary | ICD-10-CM

## 2015-12-12 DIAGNOSIS — O288 Other abnormal findings on antenatal screening of mother: Secondary | ICD-10-CM

## 2015-12-12 DIAGNOSIS — Z3493 Encounter for supervision of normal pregnancy, unspecified, third trimester: Secondary | ICD-10-CM

## 2015-12-12 DIAGNOSIS — O09523 Supervision of elderly multigravida, third trimester: Secondary | ICD-10-CM

## 2015-12-12 DIAGNOSIS — R8271 Bacteriuria: Secondary | ICD-10-CM

## 2015-12-12 NOTE — Discharge Instructions (Signed)
Evaluación de los movimientos fetales  °(Fetal Movement Counts) °Nombre del paciente: __________________________________________________ Fecha de parto estimada: ____________________ °La evaluación de los movimientos fetales es muy recomendable en los embarazos de alto riesgo, pero también es una buena idea que lo hagan todas las embarazadas. El médico le indicará que comience a contarlos a las 28 semanas de embarazo. Los movimientos fetales suelen aumentar:  °· Después de una comida completa. °· Después de la actividad física. °· Después de comer o beber algo dulce o frío. °· En reposo. °Preste atención cuando sienta que el bebé está más activo. Esto le ayudará a notar un patrón de ciclos de vigilia y sueño de su bebé y cuáles son los factores que contribuyen a un aumento de los movimientos fetales. Es importante llevar a cabo un recuento de movimientos fetales, al mismo tiempo cada día, cuando el bebé normalmente está más activo.  °CÓMO CONTAR LOS MOVIMIENTOS FETALES °1. Busque un lugar tranquilo y cómodo para sentarse o recostarse sobre el lado izquierdo. Al recostarse sobre su lado izquierdo, le proporciona una mejor circulación de sangre y oxígeno al bebé. °2. Anote el día y la hora en una hoja de papel o en un diario. °3. Comience contando las pataditas, revoloteos, chasquidos, vueltas o pinchazos en un período de 2 horas. Debe sentir al menos 10 movimientos en 2 horas. °4. Si no siente 10 movimientos en 2 horas, espere 2 ó 3 horas y cuente de nuevo. Busque cambios en el patrón o si no cuenta lo suficiente en 2 horas. °SOLICITE ATENCIÓN MÉDICA SI:  °· Siente menos de 10 pataditas en 2 horas, en dos intentos. °· No hay movimientos durante una hora. °· El patrón se modifica o le lleva más tiempo cada día contar las 10 pataditas. °· Siente que el bebé no se mueve como lo hace habitualmente. °Fecha: ____________ Movimientos: ____________ Hora de inicio: ____________ Hora de finalización: ____________  °Fecha:  ____________ Movimientos: ____________ Hora de inicio: ____________ Hora de finalización: ____________  °Fecha: ____________ Movimientos: ____________ Hora de inicio: ____________ Hora de finalización: ____________  °Fecha: ____________ Movimientos: ____________ Hora de inicio: ____________ Hora de finalización: ____________  °Fecha: ____________ Movimientos: ____________ Hora de inicio: ____________ Hora de finalización: ____________  °Fecha: ____________ Movimientos: ____________ Hora de inicio: ____________ Hora de finalización: ____________  °Fecha: ____________ Movimientos: ____________ Hora de inicio: ____________ Hora de finalización: ____________  °Fecha: ____________ Movimientos: ____________ Hora de inicio: ____________ Hora de finalización: ____________  °Fecha: ____________ Movimientos: ____________ Hora de inicio: ____________ Hora de finalización: ____________  °Fecha: ____________ Movimientos: ____________ Hora de inicio: ____________ Hora de finalización: ____________  °Fecha: ____________ Movimientos: ____________ Hora de inicio: ____________ Hora de finalización: ____________  °Fecha: ____________ Movimientos: ____________ Hora de inicio: ____________ Hora de finalización: ____________  °Fecha: ____________ Movimientos: ____________ Hora de inicio: ____________ Hora de finalización: ____________  °Fecha: ____________ Movimientos: ____________ Hora de inicio: ____________ Hora de finalización: ____________  °Fecha: ____________ Movimientos: ____________ Hora de inicio: ____________ Hora de finalización: ____________  °Fecha: ____________ Movimientos: ____________ Hora de inicio: ____________ Hora de finalización: ____________  °Fecha: ____________ Movimientos: ____________ Hora de inicio: ____________ Hora de finalización: ____________  °Fecha: ____________ Movimientos: ____________ Hora de inicio: ____________ Hora de finalización: ____________  °Fecha: ____________ Movimientos: ____________ Hora  de inicio: ____________ Hora de finalización: ____________  °Fecha: ____________ Movimientos: ____________ Hora de inicio: ____________ Hora de finalización: ____________  °Fecha: ____________ Movimientos: ____________ Hora de inicio: ____________ Hora de finalización: ____________  °Fecha: ____________ Movimientos: ____________ Hora de inicio: ____________ Hora de   finalizacin: ____________  Toma Copier: ____________ Movimientos: ____________ Eda Paschal inicio: ____________ Eda Paschal finalizacin: ____________  Toma Copier: ____________ Movimientos: ____________ Eda Paschal inicio: ____________ Eda Paschal finalizacin: ____________  Toma Copier: ____________ Movimientos: ____________ Eda Paschal inicio: ____________ Eda Paschal finalizacin: ____________  Toma Copier: ____________ Movimientos: ____________ Eda Paschal inicio: ____________ Eda Paschal finalizacin: ____________  Toma Copier: ____________ Movimientos: ____________ Eda Paschal inicio: ____________ Mellody Drown de finalizacin: ____________  Toma Copier: ____________ Movimientos: ____________ Mellody Drown de inicio: ____________ Mellody Drown de finalizacin: ____________  Toma Copier: ____________ Movimientos: ____________ Mellody Drown de inicio: ____________ Mellody Drown de finalizacin: ____________  Toma Copier: ____________ Movimientos: ____________ Mellody Drown de inicio: ____________ Mellody Drown de finalizacin: ____________  Toma Copier: ____________ Movimientos: ____________ Mellody Drown de inicio: ____________ Mellody Drown de finalizacin: ____________  Toma Copier: ____________ Movimientos: ____________ Mellody Drown de inicio: ____________ Mellody Drown de finalizacin: ____________  Toma Copier: ____________ Movimientos: ____________ Mellody Drown de inicio: ____________ Mellody Drown de finalizacin: ____________  Toma Copier: ____________ Movimientos: ____________ Mellody Drown de inicio: ____________ Mellody Drown de finalizacin: ____________  Toma Copier: ____________ Movimientos: ____________ Mellody Drown de inicio: ____________ Mellody Drown de finalizacin: ____________  Toma Copier: ____________ Movimientos: ____________ Mellody Drown de inicio: ____________ Mellody Drown de finalizacin:  ____________  Toma Copier: ____________ Movimientos: ____________ Mellody Drown de inicio: ____________ Mellody Drown de finalizacin: ____________  Toma Copier: ____________ Movimientos: ____________ Mellody Drown de inicio: ____________ Mellody Drown de finalizacin: ____________  Toma Copier: ____________ Movimientos: ____________ Mellody Drown de inicio: ____________ Mellody Drown de finalizacin: ____________  Toma Copier: ____________ Movimientos: ____________ Mellody Drown de inicio: ____________ Mellody Drown de finalizacin: ____________  Toma Copier: ____________ Movimientos: ____________ Mellody Drown de inicio: ____________ Mellody Drown de finalizacin: ____________  Toma Copier: ____________ Movimientos: ____________ Mellody Drown de inicio: ____________ Mellody Drown de finalizacin: ____________  Toma Copier: ____________ Movimientos: ____________ Mellody Drown de inicio: ____________ Mellody Drown de finalizacin: ____________  Toma Copier: ____________ Movimientos: ____________ Mellody Drown de inicio: ____________ Mellody Drown de finalizacin: ____________  Toma Copier: ____________ Movimientos: ____________ Mellody Drown de inicio: ____________ Mellody Drown de finalizacin: ____________  Toma Copier: ____________ Movimientos: ____________ Mellody Drown de inicio: ____________ Mellody Drown de finalizacin: ____________  Toma Copier: ____________ Movimientos: ____________ Mellody Drown de inicio: ____________ Mellody Drown de finalizacin: ____________  Toma Copier: ____________ Movimientos: ____________ Mellody Drown de inicio: ____________ Mellody Drown de finalizacin: ____________  Toma Copier: ____________ Movimientos: ____________ Mellody Drown de inicio: ____________ Mellody Drown de finalizacin: ____________  Toma Copier: ____________ Movimientos: ____________ Mellody Drown de inicio: ____________ Mellody Drown de finalizacin: ____________  Toma Copier: ____________ Movimientos: ____________ Mellody Drown de inicio: ____________ Mellody Drown de finalizacin: ____________  Toma Copier: ____________ Movimientos: ____________ Mellody Drown de inicio: ____________ Mellody Drown de finalizacin: ____________  Toma Copier: ____________ Movimientos: ____________ Mellody Drown de inicio: ____________ Mellody Drown de finalizacin: ____________  Toma Copier: ____________  Movimientos: ____________ Mellody Drown de inicio: ____________ Mellody Drown de finalizacin: ____________  Toma Copier: ____________ Movimientos: ____________ Mellody Drown de inicio: ____________ Mellody Drown de finalizacin: ____________  Toma Copier: ____________ Movimientos: ____________ Mellody Drown de inicio: ____________ Mellody Drown de finalizacin: ____________    Esta informacin no tiene como fin reemplazar el consejo del mdico. Asegrese de hacerle al mdico cualquier pregunta que tenga.   Document Released: 05/18/2007 Document Revised: 01/26/2012 Elsevier Interactive Patient Education [employer].

## 2015-12-12 NOTE — MAU Provider Note (Signed)
History     CSN: [id number]  Arrival date and time: 12/12/15 1104      Chief Complaint  Patient presents with  . NRNST   Conversation in [place]. Patient presents to the MAU 2/2 to non-reassuring NST with variable at am follow up visit. Patient feel the baby move and denies any N/V, Cp, SOB, Ha, Abdominal pain, cramping, vaginal bleeding, change in vaginal discharge, or new swelling    OB History    Gravida Para Term Preterm AB Living   5 2 2   2 2    SAB TAB Ectopic Multiple Live Births   1   1          Past Medical History:  Diagnosis Date  . Abnormal Pap smear 1996  . Fatty liver     Past Surgical History:  Procedure Laterality Date  . BREAST SURGERY     right breast  . ECTOPIC PREGNANCY SURGERY  2012    Family History  Problem Relation Age of Onset  . Diabetes Father   . Diabetes Maternal Aunt   . Diabetes Maternal Grandmother     Social History  Substance Use Topics  . Smoking status: Former Smoker    Packs/day: 0.30    Years: 10.00    Types: Cigarettes    Quit date: 06/07/2015  . Smokeless tobacco: Never Used  . Alcohol use No     Comment: socially    Allergies: No Known Allergies  Prescriptions Prior to Admission  Medication Sig Dispense Refill Last Dose  . calcium carbonate (TUMS - DOSED IN MG ELEMENTAL CALCIUM) 500 MG chewable tablet Chew 1 tablet by mouth 2 (two) times daily as needed for indigestion or heartburn.   Past Week at Unknown time  . Prenat-FePoly-Metf-FA-DHA-DSS (VITAFOL FE+) 90-1-200 & 50 MG CPPK Take 2 tablets by mouth daily. 60 each 12 12/12/2015 at Unknown time    Review of Systems  Constitutional: Negative for chills and fever.  Eyes: Negative for blurred vision.  Respiratory: Negative for shortness of breath.   Cardiovascular: Negative for chest pain.  Gastrointestinal: Negative for abdominal pain, constipation, diarrhea, nausea and vomiting.  Neurological: Negative for dizziness and headaches.   Physical Exam    Blood pressure 116/66, pulse 86, temperature 98.2 F (36.8 C), resp. rate 18, height 5\' 2"  (1.575 m), weight 192 lb (87.1 kg), last menstrual period 04/29/2015.  Physical Exam  Constitutional: She is oriented to person, place, and time. She appears well-developed and well-nourished. No distress.  HENT:  Head: Normocephalic and atraumatic.  Eyes: Conjunctivae and EOM are normal.  Cardiovascular: Normal rate, regular rhythm and intact distal pulses.   Respiratory: Effort normal. No respiratory distress.  GI: Bowel sounds are normal. There is no tenderness.  Musculoskeletal: She exhibits no tenderness.  Neurological: She is alert and oriented to person, place, and time.  Skin: Skin is warm and dry.  Psychiatric: She has a normal mood and affect. Her behavior is normal. Judgment and thought content normal.    MAU Course  Procedures  MDM Patient is being seen for non-reassuring NST with variables. Fetal heart tracing were reassuring while in the MAU, patient is pending a BPP  Assessment and Plan  Patient is to be discharged to home. BPP 8/8 with reactive, category 1 heart tracing  Josue D Santos 12/12/2015, 1:47 PM   OB FELLOW MAU DISCHARGE ATTESTATION  I have seen and examined this patient; I agree with above documentation in the resident's note.   I personally  reviewed the patient's NST today, found to be REACTIVE. Reviewed BPP also found to be reactive 8/8.   Katherine Basset, DO OB Fellow

## 2015-12-12 NOTE — Progress Notes (Signed)
Patient is in office, states that she ate something that caused her face to break out and itch. Pt states to her knowledge she is not allergic to anything.

## 2015-12-12 NOTE — MAU Note (Signed)
Patient sent by [facility] for further monitoring seen in office today had NR NST with mild variables.

## 2015-12-12 NOTE — Progress Notes (Signed)
Subjective:    Kelsey Swanson is a 45 y.o. female being seen today for her obstetrical visit. She is at [redacted]w[redacted]d gestation. Patient reports no complaints. Fetal movement: normal at night, decreased during the day\.  Problem List Items Addressed This Visit    None    Visit Diagnoses    AMA (advanced maternal age) multigravida 72+, third trimester    -  Primary   Supervision of high risk elderly multigravida in third trimester         Patient Active Problem List   Diagnosis Date Noted  . Non-English speaking patient 12/04/2015  . GBS bacteriuria 11/13/2015  . AMA (advanced maternal age) multigravida 35+ 11/13/2015  . Supervision of high risk elderly multigravida in second trimester 07/01/2015  . OBESITY, NOS 04/21/2006    Objective:    BP 109/63   Pulse 82   Temp 98.1 F (36.7 C)   Wt 192 lb 9.6 oz (87.4 kg)   LMP 04/29/2015   BMI 34.12 kg/m  FHT: 130 BPM  Uterine Size: 39 cm and size equals dates  Presentations: cephalic  Pelvic Exam: deferred  NST: 1 accel, 2 variable decels to the 90's for 5 seconds, prolonged monitoring, ate, went to BR, FHR: 130.   Assessment:    Pregnancy @ [redacted]w[redacted]d weeks   non-reactive NST  Plan:   Plans for delivery: Vaginal anticipated; labs reviewed; problem list updated Counseling: Consent signed. Infant feeding: plans to breastfeed. Cigarette smoking: never smoked. L&D discussion: symptoms of labor, discussed when to call, discussed what number to call, anesthetic/analgesic options reviewed and delivering clinician:  plans no preference. Postpartum supports and preparation: circumcision discussed and contraception plans discussed.  Follow up in 1 Week with NST.

## 2015-12-15 ENCOUNTER — Ambulatory Visit (HOSPITAL_COMMUNITY)
Admission: RE | Admit: 2015-12-15 | Discharge: 2015-12-15 | Disposition: A | Discharge status: Home / Self Care | Payer: Medicaid Other | Source: Ambulatory Visit | Attending: Obstetrics | Admitting: Obstetrics

## 2015-12-15 ENCOUNTER — Encounter (HOSPITAL_COMMUNITY): Payer: Self-pay

## 2015-12-15 ENCOUNTER — Other Ambulatory Visit (HOSPITAL_COMMUNITY): Payer: Self-pay | Admitting: Maternal and Fetal Medicine

## 2015-12-15 ENCOUNTER — Telehealth (HOSPITAL_COMMUNITY): Payer: Self-pay | Admitting: *Deleted

## 2015-12-15 DIAGNOSIS — Z3A38 38 weeks gestation of pregnancy: Secondary | ICD-10-CM | POA: Insufficient documentation

## 2015-12-15 DIAGNOSIS — O09523 Supervision of elderly multigravida, third trimester: Secondary | ICD-10-CM

## 2015-12-15 NOTE — Telephone Encounter (Signed)
Preadmission screen Interpreter number [phone]

## 2015-12-15 NOTE — ED Notes (Signed)
Pt in MFM for NST and AFI.  FHR 130's-140's EFM tracing appears to be halfing the FHR.  Pt to ultrasound room 1 for BPP and AFI.  Dr. Lucia Gaskins aware.

## 2015-12-15 NOTE — ED Notes (Signed)
Pt reports leaking clear fluid since 2am, denies contractions or bleeding.  Good fetal movement reported.  Spanish interpreter Ruthy Dick at the bedside.

## 2015-12-16 ENCOUNTER — Encounter (HOSPITAL_COMMUNITY): Payer: Self-pay | Admitting: *Deleted

## 2015-12-16 ENCOUNTER — Inpatient Hospital Stay (HOSPITAL_COMMUNITY)
Admission: AD | Admit: 2015-12-16 | Discharge: 2015-12-18 | DRG: 775 | Disposition: A | Discharge status: Home / Self Care | Payer: Medicaid Other | Source: Ambulatory Visit | Attending: Obstetrics and Gynecology | Admitting: Obstetrics and Gynecology

## 2015-12-16 DIAGNOSIS — R8271 Bacteriuria: Secondary | ICD-10-CM

## 2015-12-16 DIAGNOSIS — Z3A38 38 weeks gestation of pregnancy: Secondary | ICD-10-CM

## 2015-12-16 DIAGNOSIS — Z87891 Personal history of nicotine dependence: Secondary | ICD-10-CM

## 2015-12-16 DIAGNOSIS — Z3483 Encounter for supervision of other normal pregnancy, third trimester: Secondary | ICD-10-CM | POA: Diagnosis present

## 2015-12-16 DIAGNOSIS — O09523 Supervision of elderly multigravida, third trimester: Secondary | ICD-10-CM

## 2015-12-16 DIAGNOSIS — O99824 Streptococcus B carrier state complicating childbirth: Secondary | ICD-10-CM | POA: Diagnosis present

## 2015-12-16 DIAGNOSIS — Z833 Family history of diabetes mellitus: Secondary | ICD-10-CM

## 2015-12-16 LAB — TYPE AND SCREEN
ABO/RH(D): O POS
Antibody Screen: NEGATIVE

## 2015-12-16 LAB — CBC
HCT: 37.7 % (ref 36.0–46.0)
Hemoglobin: 12.8 g/dL (ref 12.0–15.0)
MCH: 28.3 pg (ref 26.0–34.0)
MCHC: 34 g/dL (ref 30.0–36.0)
MCV: 83.4 fL (ref 78.0–100.0)
Platelets: 237 10*3/uL (ref 150–400)
RBC: 4.52 MIL/uL (ref 3.87–5.11)
RDW: 13.7 % (ref 11.5–15.5)
WBC: 20.6 10*3/uL — ABNORMAL HIGH (ref 4.0–10.5)

## 2015-12-16 LAB — RPR: RPR Ser Ql: NONREACTIVE

## 2015-12-16 MED ORDER — LACTATED RINGERS IV SOLN
INTRAVENOUS | Status: DC
  Administered 2015-12-16: 08:00:00 via INTRAVENOUS

## 2015-12-16 MED ORDER — PRENATAL MULTIVITAMIN CH
1.0000 | ORAL_TABLET | Freq: Every day | ORAL | Status: DC
  Administered 2015-12-17 – 2015-12-18 (×2): 1 via ORAL
  Filled 2015-12-16 (×2): qty 1

## 2015-12-16 MED ORDER — DIPHENHYDRAMINE HCL 25 MG PO CAPS: 25.0000 mg | ORAL_CAPSULE | Freq: Four times a day (QID) | ORAL | Status: DC | PRN

## 2015-12-16 MED ORDER — OXYCODONE-ACETAMINOPHEN 5-325 MG PO TABS
1.0000 | ORAL_TABLET | ORAL | Status: DC | PRN
Start: 2015-12-16 — End: 2015-12-16

## 2015-12-16 MED ORDER — SODIUM CHLORIDE 0.9 % IV SOLN
2.0000 g | Freq: Once | INTRAVENOUS | Status: AC
  Administered 2015-12-16: 2 g via INTRAVENOUS
  Filled 2015-12-16: qty 2000

## 2015-12-16 MED ORDER — ONDANSETRON HCL 4 MG/2ML IJ SOLN
4.0000 mg | Freq: Four times a day (QID) | INTRAMUSCULAR | Status: DC | PRN
  Administered 2015-12-16: 4 mg via INTRAVENOUS
  Filled 2015-12-16: qty 2

## 2015-12-16 MED ORDER — BENZOCAINE-MENTHOL 20-0.5 % EX AERO
1.0000 "application " | INHALATION_SPRAY | CUTANEOUS | Status: DC | PRN
  Filled 2015-12-16: qty 56

## 2015-12-16 MED ORDER — TETANUS-DIPHTH-ACELL PERTUSSIS 5-2.5-18.5 LF-MCG/0.5 IM SUSP: 0.5000 mL | Freq: Once | INTRAMUSCULAR | Status: DC

## 2015-12-16 MED ORDER — LIDOCAINE HCL (PF) 1 % IJ SOLN
30.0000 mL | INTRAMUSCULAR | Status: AC | PRN
  Administered 2015-12-16: 30 mL via SUBCUTANEOUS
  Filled 2015-12-16: qty 30

## 2015-12-16 MED ORDER — LACTATED RINGERS IV SOLN: 500.0000 mL | INTRAVENOUS | Status: DC | PRN

## 2015-12-16 MED ORDER — DIBUCAINE 1 % RE OINT: 1.0000 "application " | TOPICAL_OINTMENT | RECTAL | Status: DC | PRN

## 2015-12-16 MED ORDER — SOD CITRATE-CITRIC ACID 500-334 MG/5ML PO SOLN: 30.0000 mL | ORAL | Status: DC | PRN

## 2015-12-16 MED ORDER — WITCH HAZEL-GLYCERIN EX PADS: 1.0000 "application " | MEDICATED_PAD | CUTANEOUS | Status: DC | PRN

## 2015-12-16 MED ORDER — ZOLPIDEM TARTRATE 5 MG PO TABS: 5.0000 mg | ORAL_TABLET | Freq: Every evening | ORAL | Status: DC | PRN

## 2015-12-16 MED ORDER — ONDANSETRON HCL 4 MG/2ML IJ SOLN: 4.0000 mg | INTRAMUSCULAR | Status: DC | PRN

## 2015-12-16 MED ORDER — SIMETHICONE 80 MG PO CHEW: 80.0000 mg | CHEWABLE_TABLET | ORAL | Status: DC | PRN

## 2015-12-16 MED ORDER — VITAFOL FE+ 90-1-200 & 50 MG PO CPPK: 2.0000 | ORAL_CAPSULE | Freq: Every day | ORAL | Status: DC

## 2015-12-16 MED ORDER — SENNOSIDES-DOCUSATE SODIUM 8.6-50 MG PO TABS
2.0000 | ORAL_TABLET | ORAL | Status: DC
  Administered 2015-12-16 – 2015-12-18 (×2): 2 via ORAL
  Filled 2015-12-16 (×2): qty 2

## 2015-12-16 MED ORDER — ONDANSETRON HCL 4 MG PO TABS: 4.0000 mg | ORAL_TABLET | ORAL | Status: DC | PRN

## 2015-12-16 MED ORDER — COCONUT OIL OIL
1.0000 | TOPICAL_OIL | Status: DC | PRN
Start: 2015-12-16 — End: 2015-12-18

## 2015-12-16 MED ORDER — IBUPROFEN 600 MG PO TABS
600.0000 mg | ORAL_TABLET | Freq: Four times a day (QID) | ORAL | Status: DC
  Administered 2015-12-16 – 2015-12-18 (×8): 600 mg via ORAL
  Filled 2015-12-16 (×8): qty 1

## 2015-12-16 MED ORDER — OXYTOCIN BOLUS FROM INFUSION
500.0000 mL | Freq: Once | INTRAVENOUS | Status: AC
  Administered 2015-12-16: 999 mL/h via INTRAVENOUS

## 2015-12-16 MED ORDER — OXYTOCIN 40 UNITS IN LACTATED RINGERS INFUSION - SIMPLE MED
2.5000 [IU]/h | INTRAVENOUS | Status: DC
  Filled 2015-12-16: qty 1000

## 2015-12-16 MED ORDER — OXYCODONE-ACETAMINOPHEN 5-325 MG PO TABS: 2.0000 | ORAL_TABLET | ORAL | Status: DC | PRN

## 2015-12-16 MED ORDER — ACETAMINOPHEN 325 MG PO TABS: 650.0000 mg | ORAL_TABLET | ORAL | Status: DC | PRN

## 2015-12-16 MED ORDER — CALCIUM CARBONATE ANTACID 500 MG PO CHEW: 1.0000 | CHEWABLE_TABLET | Freq: Two times a day (BID) | ORAL | Status: DC | PRN

## 2015-12-16 NOTE — H&P (Signed)
LABOR AND DELIVERY ADMISSION HISTORY AND PHYSICAL NOTE  Kelsey Swanson is a 45 y.o. female [phone] with IUP at [redacted]w[redacted]d by [place] @ MFM presenting for SOL.   She reports positive fetal movement. Patient states that contractions began at 0630 with BRB per vagina.      Past Medical History: Past Medical History:  Diagnosis Date  . Abnormal Pap smear 1996  . Fatty liver     Past Surgical History: Past Surgical History:  Procedure Laterality Date  . BREAST SURGERY     right breast  . ECTOPIC PREGNANCY SURGERY  2012    Obstetrical History: OB History    Gravida Para Term Preterm AB Living   5 3 3   2 3    SAB TAB Ectopic Multiple Live Births   1   1 0 1      Social History: Social History   Social History  . Marital status: Married    Spouse name: N/A  . Number of children: N/A  . Years of education: N/A   Social History Main Topics  . Smoking status: Former Smoker    Packs/day: 0.30    Years: 10.00    Types: Cigarettes    Quit date: 06/07/2015  . Smokeless tobacco: Never Used  . Alcohol use Yes     Comment: socially  . Drug use: No  . Sexual activity: Yes    Partners: Male    Birth control/ protection: None     Comment: early preg   Other Topics Concern  . None   Social History Narrative  . None    Family History: Family History  Problem Relation Age of Onset  . Diabetes Father   . Diabetes Maternal Aunt   . Diabetes Maternal Grandmother     Allergies: No Known Allergies  Prescriptions Prior to Admission  Medication Sig Dispense Refill Last Dose  . calcium carbonate (TUMS - DOSED IN MG ELEMENTAL CALCIUM) 500 MG chewable tablet Chew 1 tablet by mouth as needed for indigestion or heartburn.    Past Month at Unknown time  . Prenat-FePoly-Metf-FA-DHA-DSS (VITAFOL FE+) 90-1-200 & 50 MG CPPK Take 2 capsules by mouth daily.   12/15/2015 at Unknown time  . psyllium (HYDROCIL/METAMUCIL) 95 % PACK Take 1 packet by mouth daily as needed for mild constipation.    Past Month at Unknown time     Review of Systems   All systems reviewed and negative except as stated in HPI  Blood pressure (!) 102/52, pulse 84, temperature 98.5 F (36.9 C), temperature source Oral, resp. rate 18, height 5\' 3"  (1.6 m), weight 190 lb (86.2 kg), last menstrual period 04/29/2015, unknown if currently breastfeeding. General appearance: alert, cooperative, appears stated age and severe distress Heart: regular rate and rhythm with intact distal pulses Fetal monitoring: Category 1  Uterine activity: Contractions Q 1-2 min Dilation: 10 Effacement (%): 100 Station: +1 Exam by:: Dr. Jory Ee   Prenatal labs: ABO, Rh: --/--/O POS (10/24 0750) Antibody: NEG (10/24 0750) Rubella: !Error! RPR: Non Reactive (10/24 0750)  HBsAg: Negative (05/09 1059)  HIV: Non Reactive (08/09 1110)  GBS: Positive (10/05 1618)  1 hr Glucola: 81 Genetic screening:  None Anatomy US: Normal at MFM  Prenatal Transfer Tool  Maternal Diabetes: No Genetic Screening: Normal Maternal Ultrasounds/Referrals: Normal Fetal Ultrasounds or other Referrals:  Referred to Materal Fetal Medicine  Maternal Substance Abuse:  No Significant Maternal Medications:  None Significant Maternal Lab Results: None  Results for orders placed or performed during  the hospital encounter of 12/16/15 (from the past 24 hour(s))  CBC   Collection Time: 12/16/15  7:50 AM  Result Value Ref Range   WBC 20.6 (H) 4.0 - 10.5 K/uL   RBC 4.52 3.87 - 5.11 MIL/uL   Hemoglobin 12.8 12.0 - 15.0 g/dL   HCT 37.7 36.0 - 46.0 %   MCV 83.4 78.0 - 100.0 fL   MCH 28.3 26.0 - 34.0 pg   MCHC 34.0 30.0 - 36.0 g/dL   RDW 13.7 11.5 - 15.5 %   Platelets 237 150 - 400 K/uL  RPR   Collection Time: 12/16/15  7:50 AM  Result Value Ref Range   RPR Ser Ql Non Reactive Non Reactive  Type and screen [facility]   Collection Time: 12/16/15  7:50 AM  Result Value Ref Range   ABO/RH(D) O POS    Antibody Screen NEG     Sample Expiration 12/19/2015     Patient Active Problem List   Diagnosis Date Noted  . Labor and delivery indication for care or intervention 12/16/2015  . Non-English speaking patient 12/04/2015  . GBS bacteriuria 11/13/2015  . AMA (advanced maternal age) multigravida 35+ 11/13/2015  . Supervision of high risk elderly multigravida in second trimester 07/01/2015  . OBESITY, NOS 04/21/2006    Assessment: Kelsey Swanson is a 45 y.o. [id number] at [redacted]w[redacted]d here for SOL  #labor: Precipitous labor with inadequate time for epidural #Pain: Unable to initiate pain control #FWB: Category 1 #ID:  GBS positive, unable to initiate GBS prophylaxis #MOF: Breast feeding #MOC: Will discuss after delivery #Circ:  Female  Kelsey Swanson 12/16/2015, 1:40 PM  OB FELLOW HISTORY AND PHYSICAL ATTESTATION  I have seen and examined this patient; I agree with above documentation in the resident's note.    Katherine Basset, DO [place] Fellow 12/16/2015

## 2015-12-16 NOTE — Addendum Note (Signed)
Addended by: Bettye Boeck on: 12/16/2015 09:40 AM   Modules accepted: Orders, SmartSet

## 2015-12-16 NOTE — Plan of Care (Signed)
Problem: Activity: Goal: Ability to tolerate increased activity will improve Outcome: Completed/Met Date Met: 12/16/15 Pt ambulating independently in room.  Encouraged ambulation in hallway.

## 2015-12-17 MED ORDER — PNEUMOCOCCAL VAC POLYVALENT 25 MCG/0.5ML IJ INJ
0.5000 mL | INJECTION | INTRAMUSCULAR | Status: AC
  Administered 2015-12-17: 0.5 mL via INTRAMUSCULAR
  Filled 2015-12-17: qty 0.5

## 2015-12-17 NOTE — Progress Notes (Signed)
Post Partum Day 1 Subjective: Eating, drinking, voiding, ambulating well.  +flatus.  Lochia and pain wnl.  Denies dizziness, lightheadedness, or sob. No complaints.   Objective: Blood pressure 110/60, pulse 88, temperature 98.1 F (36.7 C), temperature source Oral, resp. rate 18, height 5\' 3"  (1.6 m), weight 86.2 kg (190 lb), last menstrual period 04/29/2015, unknown if currently breastfeeding.  Physical Exam:  General: alert, cooperative and no distress Lochia: appropriate Uterine Fundus: firm Incision: n/a DVT Evaluation: No evidence of DVT seen on physical exam. Negative Homan's sign. No cords or calf tenderness. No significant calf/ankle edema.   Recent Labs  12/16/15 0750  HGB 12.8  HCT 37.7    Assessment/Plan: Plan for discharge tomorrow , inadequate GBS tx Bottlefeeding AMA, prev smoker- states may resume, counseled progestin only methods best for her- depo/nexp/iud/pops, undecided at this time    LOS: 1 day   Tawnya Crook 12/17/2015, 7:22 AM

## 2015-12-18 MED ORDER — ACETAMINOPHEN-CODEINE #3 300-30 MG PO TABS: 1.0000 | ORAL_TABLET | ORAL | 0 refills | Status: DC | PRN

## 2015-12-18 MED ORDER — SENNOSIDES-DOCUSATE SODIUM 8.6-50 MG PO TABS: 2.0000 | ORAL_TABLET | Freq: Two times a day (BID) | ORAL | 2 refills | Status: DC

## 2015-12-18 MED ORDER — IBUPROFEN 600 MG PO TABS: 600.0000 mg | ORAL_TABLET | Freq: Four times a day (QID) | ORAL | 2 refills | Status: DC

## 2015-12-18 NOTE — Discharge Summary (Signed)
Obstetric Discharge Summary Reason for Admission: onset of labor and AMA, [redacted]w[redacted]d Prenatal Procedures: NST and ultrasound Intrapartum Procedures: spontaneous vaginal delivery Postpartum Procedures: none Complications-Operative and Postpartum: 1st degree perineal laceration Hemoglobin  Date Value Ref Range Status  12/16/2015 12.8 12.0 - 15.0 g/dL Final   HCT  Date Value Ref Range Status  12/16/2015 37.7 36.0 - 46.0 % Final   Hematocrit  Date Value Ref Range Status  10/01/2015 37.1 34.0 - 46.6 % Final    Physical Exam:  General: alert, cooperative and no distress Lochia: appropriate Uterine Fundus: firm Incision: no significant drainage, no dehiscence, no significant erythema DVT Evaluation: No evidence of DVT seen on physical exam. No cords or calf tenderness. No significant calf/ankle edema.  Discharge Diagnoses: Term Pregnancy-delivered and AMA  Discharge Information: Date: 12/18/2015 Activity: pelvic rest Diet: routine Medications: PNV, Tylenol #3, Ibuprofen and Colace Condition: stable Instructions: refer to practice specific booklet Discharge to: home Follow-up Information    Kelsey Swanson, CNM Follow up in 2 week(s).   Specialty:  Certified Nurse Midwife Contact information: [address] [place] [address] [phone]           Newborn Data: Live born female  Birth Weight: 6 lb 1.7 oz (2770 g) APGAR: 9, 9  Home with mother.  Kelsey Swanson, CNM 12/18/2015, 8:18 AM

## 2015-12-18 NOTE — Progress Notes (Signed)
Post Partum Day #2 Subjective: no complaints, up ad lib, voiding and tolerating PO  Objective: Blood pressure 105/65, pulse 79, temperature 98.2 F (36.8 C), temperature source Oral, resp. rate 18, height 5\' 3"  (1.6 m), weight 190 lb (86.2 kg), last menstrual period 04/29/2015, unknown if currently breastfeeding.  Physical Exam:  General: alert, cooperative and no distress Lochia: appropriate Uterine Fundus: firm Incision: no significant drainage, no dehiscence, no significant erythema DVT Evaluation: No evidence of DVT seen on physical exam. No cords or calf tenderness. No significant calf/ankle edema.   Recent Labs  12/16/15 0750  HGB 12.8  HCT 37.7    Assessment/Plan: Discharge home and Contraception planning POP or IUD   LOS: 2 days   Morene Crocker , CNM 12/18/2015, 8:14 AM

## 2015-12-18 NOTE — Progress Notes (Signed)
Plan of care explained to patient. She understands without problem.

## 2015-12-22 ENCOUNTER — Ambulatory Visit (HOSPITAL_COMMUNITY): Admission: RE | Admit: 2015-12-22 | Payer: Medicaid Other | Source: Ambulatory Visit

## 2015-12-22 ENCOUNTER — Ambulatory Visit (HOSPITAL_COMMUNITY): Payer: Medicaid Other

## 2015-12-23 ENCOUNTER — Encounter: Payer: Medicaid Other | Admitting: Obstetrics & Gynecology

## 2015-12-26 ENCOUNTER — Inpatient Hospital Stay (HOSPITAL_COMMUNITY): Admit: 2015-12-26 | Payer: Medicaid Other

## 2016-01-05 ENCOUNTER — Encounter: Payer: Medicaid Other | Admitting: Obstetrics and Gynecology

## 2016-01-21 ENCOUNTER — Ambulatory Visit (INDEPENDENT_AMBULATORY_CARE_PROVIDER_SITE_OTHER): Payer: Medicaid Other | Admitting: Obstetrics and Gynecology

## 2016-01-21 DIAGNOSIS — Z30016 Encounter for initial prescription of transdermal patch hormonal contraceptive device: Secondary | ICD-10-CM

## 2016-01-21 DIAGNOSIS — IMO0001 Reserved for inherently not codable concepts without codable children: Secondary | ICD-10-CM | POA: Insufficient documentation

## 2016-01-21 MED ORDER — NORELGESTROMIN-ETH ESTRADIOL 150-35 MCG/24HR TD PTWK: 1.0000 | MEDICATED_PATCH | TRANSDERMAL | 12 refills | Status: DC

## 2016-01-21 NOTE — Progress Notes (Signed)
..  Post Partum Exam  Kelsey Swanson is a 45 y.o. [phone] female who presents for a postpartum visit. She is 5 weeks postpartum following a spontaneous vaginal delivery. I have fully reviewed the prenatal and intrapartum course. The delivery was at [redacted]w[redacted]d gestational weeks.  Anesthesia: none. Postpartum course has been unremarkable. Baby's course has been unremarkable. Baby is feeding by bottle - Similac with Iron. Bleeding no bleeding. Bowel function is normal. Bladder function is normal. Patient is not sexually active. Contraception method is abstinence. Postpartum depression screening:neg  The following portions of the patient's history were reviewed and updated as appropriate: allergies, current medications, past family history, past medical history, past social history and past surgical history.  Review of Systems Pertinent items are noted in HPI.    Objective:    BP 116/78 mmHg  Pulse 78  Resp 16  Ht 5\' 5"  (1.651 m)  Wt 211 lb (95.709 kg)  BMI 35.11 kg/m2  Breastfeeding? Yes  General:  alert   Breasts:  not evaluated  Lungs: clear to auscultation bilaterally  Heart:  regular rate and rhythm, S1, S2 normal, no murmur, click, rub or gallop  Abdomen: soft, non-tender; bowel sounds normal; no masses,  no organomegaly   Vulva:  not evaluated  Vagina: not evaluated  Cervix:  not evaluated  Corpus: not examined  Adnexa:  not evaluated  Rectal Exam: Not performed.        Assessment:    Normal postpartum exam.   Plan:   1. Contraception: Ortho-Evra patches weekly  2. Follow up in: 6 months for yearly exam or as needed.

## 2016-01-21 NOTE — Patient Instructions (Signed)
Ethinyl Estradiol; Norelgestromin skin patches Qu es este medicamento? El ETINIL ESTRADIOL; NORELGESTROMIN es un parche utilizado como anticonceptivo (mtodo anticonceptivo). Este producto [employer] tipos de hormonas femeninas, un estrgeno y Ardelia Mems progestina. El parche se [place] para evitar la ovulacin y Water quality scientist. Este medicamento puede ser utilizado para otros usos; si tiene alguna pregunta consulte con su proveedor de atencin mdica o con su farmacutico. MARCAS COMUNES: Ortho Evra, Moises Blood le debo informar a mi profesional de la salud antes de tomar este medicamento? Necesita saber si usted presenta alguno de los siguientes problemas o situaciones: -sangrado vaginal anormal -enfermedad vascular o cogulos sanguneos -cncer de mama, cervical, endometrio, ovario, hgado o uterino -diabetes -enfermedad de la vescula biliar -enfermedad cardiaca o ataque cardiaco reciente -alta presin sangunea -niveles elevados de colesterol -enfermedad renal -enfermedad heptica -migraas -derrame cerebral -lupus eritematoso sistmico (LES) -fumador -una reaccin alrgica o inusual a los estrgenos, las progestinas, otros medicamentos, alimentos, Scientist, water quality o conservantes -si est embarazada o buscando quedar embarazada -si est amamantando a un beb Cmo debo Insurance account manager medicamento? Este parche se aplica sobre la piel. Siga las instrucciones de la etiqueta del [place]. Aplquelo en una zona limpia, seca y sana de la piel en las nalgas, abdomen, parte superior externa del brazo o parte superior del torso, en un lugar donde la ropa ajustada no lo rozar. No debe aplicarse otros cosmticos ni lociones sobre la zona de la piel donde se Clinical research associate. Presione firmemente el parche en la zona escogida durante 10 segundos para asegurarse de que haya buen contacto con la piel. Recambie el parche cada 7 das en el [place] 3 semanas. Deje transcurrir 1 semana antes de volver a  Glass blower/designer un parche nuevo. No use su medicamento con una frecuencia mayor a la indicada. Hable con su pediatra para informarse acerca del uso de este medicamento en nios. Puede requerir atencin especial. Este medicamentos ha sido usado en nias que han empezados a tener perodos [place]. Usted recibir un prospecto para el paciente para este producto con cada receta y relleno. Asegrese de leer este folleto cada vez cuidadosamente. Este folleto puede cambiar con frecuencia. Sobredosis: Pngase en contacto inmediatamente con un centro toxicolgico o una sala de urgencia si usted cree que haya tomado demasiado medicamento. ATENCIN: [employer] es solo para usted. No comparta este medicamento con nadie. Qu sucede si me olvido de una dosis? Deber reemplazar el parche una vez a la semana como indicado. Si pierde un parche o se cae, consulte a su profesional de la salud para que le d instrucciones. Puede necesitar usar un mtodo anticonceptivo adicional si no ha tenido un parche colocado durante ms de 1 da. Qu puede interactuar con este medicamento? -acetaminofeno -antibiticos o medicamentos para infecciones, especialmente rifampicina, rifabutina, rifapentina y griseofulvina y posiblemente penicilina o tetraciclina -aprepitant -cido ascrbico (vitamina C) -atorvastatina -barbitricos, como fenobarbital -bosentano -carbamazepina -cafena -clofibrato -ciclosporina -dantroleno -doxercalciferol -felbamato -jugo de toronja -hidrocortisona -medicamentos para la ansiedad o para los problemas para conciliar el sueo, como diazepam o temazepam -medicamentos para la diabetes como pioglitazona -modafinilo -micofenolato -nefazodona -oxcarbazepina -fenitona -prednisolona -ritonavir u otros medicamentos para tratar el virus VIH o el SIDA -rosuvastatina -selegilina -suplementos de isoflavonas de soya -hierba de [place] ser que esta lista no menciona todas las posibles interacciones. Informe a su profesional de [employer]	[place] de [employer] productos a base de hierbas, medicamentos de venta libre o suplementos nutritivos que est  tomando. Si usted fuma, consume bebidas alcohlicas o si utiliza drogas ilegales, indqueselo tambin a su profesional de [employer]	[place]. Algunas sustancias pueden interactuar con su medicamento. A qu debo estar atento al usar [employer]? Visite a su mdico o su profesional de la salud para chequear su evolucin peridicamente. Deber hacerse exmenes de las mamas y la pelvis y exmenes de Papanicolaou en forma regular mientras est tomando este medicamento. Use un mtodo anticonceptivo Paediatric nurse ciclo que est usando este parche. Si tiene algn motivo para pensar que est embarazada, deje de usar este medicamento de inmediato y comunquese con su mdico o con su profesional de Technical sales engineer. Si toma este medicamento para problemas relacionados con las hormonas, pueden pasar varios ciclos hasta que observe mejoras en su condicin. El fumar aumenta el riesgo de formacin de cogulos o de sufrir un derrame cerebral mientras toma pldoras anticonceptivas orales, especialmente si tiene ms de 35 aos. Se le recomienda enfticamente que no fume. Este medicamento puede hacer que su cuerpo retenga lquido, lo que puede provocar que se le hinchen los dedos, manos o tobillos. Su presin sangunea Regulatory affairs officer. Comunquese con su mdico o con su profesional de la salud si siente que est reteniendo lquido. Este medicamento puede aumentar la sensibilidad al sol. Mantngase fuera de Administrator. Si no lo puede evitar, utilice ropa protectora y crema de Photographer. No utilice lmparas solares, camas solares ni cabinas solares. Si [place] lentes de contacto y observa cambios en la visin, o si los lentes comienzan a resultarle incmodos, consulte al  especialista en ojos. Algunas mujeres pueden presentar sensibilidad, hinchazn o sangrado leve de las encas. Informe a su dentista si esto sucede. Cepillarse los dientes y limpiarlos con hilo dental peridicamente puede ayudar a controlar este fenmeno. Visite peridicamente a su dentista e infrmele acerca de los medicamentos que est tomando. Si va a someterse a Brewing technologist o un MRI, tal vez deba dejar de usar este medicamento de antemano. Consulte con su profesional de la salud por asesoramiento. Este medicamento no la protege de la infeccin por VIH (SIDA) ni de ninguna otra enfermedad de transmisin sexual. Qu efectos secundarios puedo tener al utilizar este medicamento? Efectos secundarios que debe informar a su mdico o a Barrister's clerk de la salud tan pronto como sea posible: -secreciones o cambios en el tejido de las mamas -cambios en el sangrado vaginal durante el perodo menstrual o entre perodos menstruales -dolor en el pecho -tos con sangre -mareos o desmayos -dolores de Pensions consultant o migraas -Social research officer, government en las piernas, brazos o entrepierna -dolores de cabeza severos o repentinos -Administrator (severo) -falta de aliento repentina -falta de coordinacin repentina, especialmente en un lado del cuerpo -problemas del habla -sntomas de infeccin vaginal como picazn, irritacin o flujo inusual -sensibilidad en la parte superior del abdomen -vmito -debilidad o entumecimiento de los brazos o piernas, especialmente de un lado del cuerpo -color amarillento de los ojos o la piel Efectos secundarios que, por lo general, no requieren atencin mdica (debe informarlos a su mdico o a su profesional de la salud si persisten o si son molestos): -sangrado o manchas importantes que continan despus de los 3 primeros ciclos de pldoras -sensibilidad en las mamas -cambios de estados de nimo, ansiedad, depresin, frustracin, ira o exaltacin -aumento de la sensibilidad al sol o a la  luz ultravioleta -nuseas -erupcin cutnea, acn o manchas marrones en la piel -aumento de peso (leve) Puede ser que esta lista no menciona  todos los posibles efectos secundarios. Comunquese a su mdico por asesoramiento mdico [employer] Usted puede informar los efectos secundarios a la FDA por telfono al 1-800-FDA-1088. Dnde debo guardar mi medicina? Mantngala fuera del alcance de los nios. Gurdela a [employer], entre 15 y 55 grados C (49 y 5 grados F). Mantenga el parche en su bolsa protectora hasta que lo vaya usar. Deseche todos los medicamentos que no haya utilizado, despus de la fecha de vencimiento. Deseche los parches utilizados de [employer]. Ya que los parches usados todava pueden contener hormonas activas, doble el parche por la mitad con los lados con pegamento hacia adentro antes de desecharlo. Desecharlo en un recipiente para basura fuera del alcance de los nios y las mascotas. ATENCIN: Este folleto es un resumen. Puede ser que no cubra toda la posible informacin. Si usted tiene preguntas acerca de esta medicina, consulte con su mdico, su farmacutico o su profesional de Technical sales engineer.  2017 Elsevier/Gold Standard (2014-04-02 00:00:00)

## 2016-06-15 ENCOUNTER — Emergency Department (HOSPITAL_COMMUNITY): Payer: Medicaid Other

## 2016-06-15 ENCOUNTER — Encounter (HOSPITAL_COMMUNITY): Payer: Self-pay

## 2016-06-15 ENCOUNTER — Inpatient Hospital Stay (HOSPITAL_COMMUNITY)
Admission: EM | Admit: 2016-06-15 | Discharge: 2016-06-17 | DRG: 439 | Disposition: A | Discharge status: Home / Self Care | Payer: Medicaid Other | Attending: Family Medicine | Admitting: Family Medicine

## 2016-06-15 DIAGNOSIS — Z6832 Body mass index (BMI) 32.0-32.9, adult: Secondary | ICD-10-CM

## 2016-06-15 DIAGNOSIS — R740 Nonspecific elevation of levels of transaminase and lactic acid dehydrogenase [LDH]: Secondary | ICD-10-CM | POA: Diagnosis present

## 2016-06-15 DIAGNOSIS — R319 Hematuria, unspecified: Secondary | ICD-10-CM | POA: Diagnosis present

## 2016-06-15 DIAGNOSIS — R7302 Impaired glucose tolerance (oral): Secondary | ICD-10-CM | POA: Diagnosis present

## 2016-06-15 DIAGNOSIS — R9431 Abnormal electrocardiogram [ECG] [EKG]: Secondary | ICD-10-CM | POA: Diagnosis not present

## 2016-06-15 DIAGNOSIS — K859 Acute pancreatitis without necrosis or infection, unspecified: Secondary | ICD-10-CM | POA: Diagnosis not present

## 2016-06-15 DIAGNOSIS — K760 Fatty (change of) liver, not elsewhere classified: Secondary | ICD-10-CM | POA: Diagnosis present

## 2016-06-15 DIAGNOSIS — F17200 Nicotine dependence, unspecified, uncomplicated: Secondary | ICD-10-CM | POA: Diagnosis present

## 2016-06-15 DIAGNOSIS — E876 Hypokalemia: Secondary | ICD-10-CM | POA: Diagnosis present

## 2016-06-15 DIAGNOSIS — E669 Obesity, unspecified: Secondary | ICD-10-CM | POA: Diagnosis present

## 2016-06-15 DIAGNOSIS — R7989 Other specified abnormal findings of blood chemistry: Secondary | ICD-10-CM

## 2016-06-15 DIAGNOSIS — E871 Hypo-osmolality and hyponatremia: Secondary | ICD-10-CM | POA: Diagnosis present

## 2016-06-15 DIAGNOSIS — Z793 Long term (current) use of hormonal contraceptives: Secondary | ICD-10-CM | POA: Diagnosis not present

## 2016-06-15 DIAGNOSIS — R945 Abnormal results of liver function studies: Secondary | ICD-10-CM

## 2016-06-15 LAB — COMPREHENSIVE METABOLIC PANEL
ALT: 155 U/L — ABNORMAL HIGH (ref 14–54)
AST: 150 U/L — ABNORMAL HIGH (ref 15–41)
Albumin: 4 g/dL (ref 3.5–5.0)
Alkaline Phosphatase: 85 U/L (ref 38–126)
Anion gap: 12 (ref 5–15)
BUN: <5 mg/dL — ABNORMAL LOW (ref 6–20)
CO2: 19 mmol/L — ABNORMAL LOW (ref 22–32)
Calcium: 8.1 mg/dL — ABNORMAL LOW (ref 8.9–10.3)
Chloride: 102 mmol/L (ref 101–111)
Creatinine, Ser: 0.65 mg/dL (ref 0.44–1.00)
GFR calc Af Amer: >60 mL/min (ref >60)
GFR calc non Af Amer: >60 mL/min (ref >60)
Glucose, Bld: 169 mg/dL — ABNORMAL HIGH (ref 65–99)
Potassium: 3.7 mmol/L (ref 3.5–5.1)
Sodium: 133 mmol/L — ABNORMAL LOW (ref 135–145)
Total Bilirubin: 2.6 mg/dL — ABNORMAL HIGH (ref 0.3–1.2)
Total Protein: 7.1 g/dL (ref 6.5–8.1)

## 2016-06-15 LAB — URINALYSIS, ROUTINE W REFLEX MICROSCOPIC
Glucose, UA: 150 mg/dL — AB
Hgb urine dipstick: NEGATIVE
Ketones, ur: 5 mg/dL — AB
Leukocytes, UA: NEGATIVE
Nitrite: NEGATIVE
Protein, ur: 100 mg/dL — AB
Specific Gravity, Urine: 1.03 (ref 1.005–1.030)
pH: 5 (ref 5.0–8.0)

## 2016-06-15 LAB — CBC
HCT: 38.6 % (ref 36.0–46.0)
Hemoglobin: 14 g/dL (ref 12.0–15.0)
MCH: 32.7 pg (ref 26.0–34.0)
MCHC: 36.3 g/dL — ABNORMAL HIGH (ref 30.0–36.0)
MCV: 90.2 fL (ref 78.0–100.0)
Platelets: 222 10*3/uL (ref 150–400)
RBC: 4.28 MIL/uL (ref 3.87–5.11)
RDW: 13.5 % (ref 11.5–15.5)
WBC: 9.8 10*3/uL (ref 4.0–10.5)

## 2016-06-15 LAB — I-STAT TROPONIN, ED: Troponin i, poc: 0 ng/mL (ref 0.00–0.08)

## 2016-06-15 LAB — LIPASE, BLOOD: Lipase: 186 U/L — ABNORMAL HIGH (ref 11–51)

## 2016-06-15 LAB — LIPID PANEL
Cholesterol: 283 mg/dL — ABNORMAL HIGH (ref 0–200)
HDL: 18 mg/dL — ABNORMAL LOW (ref >40)
LDL Cholesterol: UNDETERMINED mg/dL (ref 0–99)
Total CHOL/HDL Ratio: 15.7 RATIO
Triglycerides: 886 mg/dL — ABNORMAL HIGH (ref <150)
VLDL: UNDETERMINED mg/dL (ref 0–40)

## 2016-06-15 LAB — PREGNANCY, URINE: Preg Test, Ur: NEGATIVE

## 2016-06-15 MED ORDER — SODIUM CHLORIDE 0.9 % IV BOLUS (SEPSIS)
1000.0000 mL | Freq: Once | INTRAVENOUS | Status: AC
  Administered 2016-06-15: 1000 mL via INTRAVENOUS

## 2016-06-15 MED ORDER — ONDANSETRON 4 MG PO TBDP
ORAL_TABLET | ORAL | Status: AC
  Filled 2016-06-15: qty 1

## 2016-06-15 MED ORDER — HYDROMORPHONE HCL 1 MG/ML IJ SOLN
1.0000 mg | INTRAMUSCULAR | Status: AC | PRN
  Administered 2016-06-15 – 2016-06-16 (×2): 1 mg via INTRAVENOUS
  Filled 2016-06-15 (×2): qty 1

## 2016-06-15 MED ORDER — ONDANSETRON 4 MG PO TBDP
4.0000 mg | ORAL_TABLET | Freq: Once | ORAL | Status: AC | PRN
  Administered 2016-06-15: 4 mg via ORAL

## 2016-06-15 MED ORDER — ENOXAPARIN SODIUM 40 MG/0.4ML ~~LOC~~ SOLN
40.0000 mg | SUBCUTANEOUS | Status: DC
  Administered 2016-06-16: 40 mg via SUBCUTANEOUS
  Filled 2016-06-15 (×2): qty 0.4

## 2016-06-15 MED ORDER — ONDANSETRON HCL 4 MG/2ML IJ SOLN
4.0000 mg | Freq: Once | INTRAMUSCULAR | Status: AC
  Administered 2016-06-15: 4 mg via INTRAVENOUS
  Filled 2016-06-15: qty 2

## 2016-06-15 MED ORDER — NORELGESTROMIN-ETH ESTRADIOL 150-35 MCG/24HR TD PTWK: 1.0000 | MEDICATED_PATCH | TRANSDERMAL | Status: DC

## 2016-06-15 MED ORDER — ONDANSETRON HCL 4 MG/2ML IJ SOLN
4.0000 mg | Freq: Four times a day (QID) | INTRAMUSCULAR | Status: DC | PRN
  Administered 2016-06-16: 4 mg via INTRAVENOUS
  Filled 2016-06-15: qty 2

## 2016-06-15 MED ORDER — ONDANSETRON HCL 4 MG PO TABS: 4.0000 mg | ORAL_TABLET | Freq: Four times a day (QID) | ORAL | Status: DC | PRN

## 2016-06-15 MED ORDER — HYDROMORPHONE HCL 1 MG/ML IJ SOLN
1.0000 mg | INTRAMUSCULAR | Status: DC | PRN
  Administered 2016-06-15: 1 mg via INTRAVENOUS
  Filled 2016-06-15: qty 1

## 2016-06-15 MED ORDER — IOPAMIDOL (ISOVUE-300) INJECTION 61%
INTRAVENOUS | Status: AC
  Administered 2016-06-15: 100 mL
  Filled 2016-06-15: qty 100

## 2016-06-15 MED ORDER — SODIUM CHLORIDE 0.9 % IV SOLN
INTRAVENOUS | Status: DC
  Administered 2016-06-15 – 2016-06-16 (×2): via INTRAVENOUS

## 2016-06-15 NOTE — ED Triage Notes (Signed)
Pt presents for evaluation of severe abd pain, N/V x 3 days. Pt reports intermittent CP as well with some SOB. Pt AxO x4. Pt post partum approx 6 months.

## 2016-06-15 NOTE — ED Provider Notes (Signed)
[facility] DEPT Provider Note   CSN: [id number] Arrival date & time: 06/15/16  1412     History   Chief Complaint Chief Complaint  Patient presents with  . Abdominal Pain  . Chest Pain    HPI Kelsey Swanson is a 46 y.o. female.  HPI Pt started having abdominal pain yesterday.  The pain is very sharp and intense.  It has gotten worse since then.  SHe cant sleep or rest because of the severe pain.  SHe has been vomiting multiple times.  It gets worse if she tries to eat or drink.  She has had a few loose stools but not a lot.  No dysuria.   Drinks beer once in a while.  Not frequently. Past Medical History:  Diagnosis Date  . Abnormal Pap smear 1996  . Fatty liver     Patient Active Problem List   Diagnosis Date Noted  . Contraception 01/21/2016  . Non-English speaking patient 12/04/2015  . OBESITY, NOS 04/21/2006    Past Surgical History:  Procedure Laterality Date  . BREAST SURGERY     right breast  . ECTOPIC PREGNANCY SURGERY  2012    OB History    Gravida Para Term Preterm AB Living   5 3 3   2 3    SAB TAB Ectopic Multiple Live Births   1   1 0 1       Home Medications    Prior to Admission medications   Medication Sig Start Date End Date Taking? Authorizing Provider  norelgestromin-ethinyl estradiol (ORTHO EVRA) 150-35 MCG/24HR transdermal patch Place 1 patch onto the skin once a week. 01/21/16   Chancy Milroy, MD  Prenat-FePoly-Metf-FA-DHA-DSS (VITAFOL FE+) 90-1-200 & 50 MG CPPK Take 2 capsules by mouth daily.    Historical Provider, MD  psyllium (HYDROCIL/METAMUCIL) 95 % PACK Take 1 packet by mouth daily as needed for mild constipation.    Historical Provider, MD    Family History Family History  Problem Relation Age of Onset  . Diabetes Father   . Diabetes Maternal Aunt   . Diabetes Maternal Grandmother     Social History Social History  Substance Use Topics  . Smoking status: Former Smoker    Packs/day: 0.30    Years: 10.00   Types: Cigarettes    Quit date: 06/07/2015  . Smokeless tobacco: Never Used  . Alcohol use Yes     Comment: socially     Allergies   Patient has no known allergies.   Review of Systems Review of Systems  All other systems reviewed and are negative.    Physical Exam Updated Vital Signs BP 106/72   Pulse 94   Temp 98.7 F (37.1 C) (Oral)   Resp (!) 23   LMP 05/23/2016 (Exact Date)   SpO2 96%   Physical Exam  Constitutional: She appears well-developed and well-nourished. No distress.  HENT:  Head: Normocephalic and atraumatic.  Right Ear: External ear normal.  Left Ear: External ear normal.  Eyes: Conjunctivae are normal. Right eye exhibits no discharge. Left eye exhibits no discharge. No scleral icterus.  Neck: Neck supple. No tracheal deviation present.  Cardiovascular: Normal rate, regular rhythm and intact distal pulses.   Pulmonary/Chest: Effort normal and breath sounds normal. No stridor. No respiratory distress. She has no wheezes. She has no rales.  Abdominal: Soft. Bowel sounds are normal. She exhibits no distension. There is tenderness in the epigastric area. There is guarding. There is no rebound.  Musculoskeletal: She exhibits  no edema or tenderness.  Neurological: She is alert. She has normal strength. No cranial nerve deficit (no facial droop, extraocular movements intact, no slurred speech) or sensory deficit. She exhibits normal muscle tone. She displays no seizure activity. Coordination normal.  Skin: Skin is warm and dry. No rash noted.  Psychiatric: She has a normal mood and affect.  Nursing note and vitals reviewed.    ED Treatments / Results  Labs (all labs ordered are listed, but only abnormal results are displayed) Labs Reviewed  LIPASE, BLOOD - Abnormal; Notable for the following:       Result Value   Lipase 186 (*)    All other components within normal limits  COMPREHENSIVE METABOLIC PANEL - Abnormal; Notable for the following:    Sodium  133 (*)    CO2 19 (*)    Glucose, Bld 169 (*)    BUN <5 (*)    Calcium 8.1 (*)    AST 150 (*)    ALT 155 (*)    Total Bilirubin 2.6 (*)    All other components within normal limits  CBC - Abnormal; Notable for the following:    MCHC 36.3 (*)    All other components within normal limits  URINALYSIS, ROUTINE W REFLEX MICROSCOPIC - Abnormal; Notable for the following:    Color, Urine RED (*)    APPearance CLOUDY (*)    Glucose, UA 150 (*)    Bilirubin Urine SMALL (*)    Ketones, ur 5 (*)    Protein, ur 100 (*)    Bacteria, UA MANY (*)    Squamous Epithelial / LPF 6-30 (*)    All other components within normal limits  PREGNANCY, URINE  LIPID PANEL  HEPATITIS PANEL, ACUTE  TRIGLYCERIDES  I-STAT BETA HCG BLOOD, ED ([facility], [facility], [facility] ONLY)  I-STAT TROPOININ, ED    EKG  EKG Interpretation  Date/Time:  Tuesday June 15 2016 14:24:21 EDT Ventricular Rate:  107 PR Interval:  148 QRS Duration: 78 QT Interval:  320 QTC Calculation: 427 R Axis:   59 Text Interpretation:  Sinus tachycardia Otherwise normal ECG No old tracing to compare Confirmed by Sintia Mckissic  MD-J, Christabel Camire ([id number]) on 06/15/2016 6:55:56 PM       Radiology Dg Chest 2 View  Result Date: 06/15/2016 CLINICAL DATA:  Acute onset of generalized abdominal pain, diarrhea and vomiting. Initial encounter. EXAM: CHEST  2 VIEW COMPARISON:  None. FINDINGS: The lungs are well-aerated and clear. There is no evidence of focal opacification, pleural effusion or pneumothorax. The heart is normal in size; the mediastinal contour is within normal limits. No acute osseous abnormalities are seen. IMPRESSION: No acute cardiopulmonary process seen. Electronically Signed   By: Garald Balding M.D.   On: 06/15/2016 18:13   Ct Abdomen Pelvis W Contrast  Result Date: 06/15/2016 CLINICAL DATA:  Centralized abdominal pain, nausea and vomiting for 3 days. Elevated lipase and liver function tests. Six month postpartum. EXAM: CT ABDOMEN AND PELVIS WITH CONTRAST  TECHNIQUE: Multidetector CT imaging of the abdomen and pelvis was performed using the standard protocol following bolus administration of intravenous contrast. CONTRAST:  136mL ISOVUE-300 IOPAMIDOL (ISOVUE-300) INJECTION 61% COMPARISON:  None. FINDINGS: LOWER CHEST: Lung bases are clear. Included heart size is normal. No pericardial effusion. HEPATOBILIARY: Diffusely hypodense, mildly enlarged liver. Normal gallbladder. PANCREAS: Peripancreatic inflammation without focal necrosis, ductal dilatation, pseudocyst or pancreatic mass. SPLEEN: Normal. ADRENALS/URINARY TRACT: Kidneys are orthotopic, demonstrating symmetric enhancement. No nephrolithiasis, hydronephrosis or solid renal masses. The unopacified ureters  are normal in course and caliber. Delayed imaging through the kidneys demonstrates symmetric prompt contrast excretion within the proximal urinary collecting system. Urinary bladder is partially distended and unremarkable. Normal adrenal glands. STOMACH/BOWEL: Large duodenum diverticulum. The stomach, small and large bowel are normal in course and caliber without inflammatory changes. Normal appendix. VASCULAR/LYMPHATIC: Aortoiliac vessels are normal in course and caliber, trace calcific atherosclerosis. No lymphadenopathy by CT size criteria. REPRODUCTIVE: Normal. OTHER: Moderate volume ascites RIGHT abdomen small amount of retroperitoneal fluid. MUSCULOSKELETAL: Nonacute.  Small fat containing umbilical hernia. IMPRESSION: Severe pancreatitis without focal necrosis or immediate complication. Moderate ascites upper abdomen.  No focal fluid collection. Mild hepatomegaly and severe hepatic steatosis versus hepatitis. Electronically Signed   By: Elon Alas M.D.   On: 06/15/2016 20:46    Procedures Procedures (including critical care time)  Medications Ordered in ED Medications  HYDROmorphone (DILAUDID) injection 1 mg (1 mg Intravenous Given 06/15/16 1948)  ondansetron (ZOFRAN-ODT) disintegrating  tablet 4 mg (4 mg Oral Given 06/15/16 1435)  ondansetron (ZOFRAN) injection 4 mg (4 mg Intravenous Given 06/15/16 1948)  sodium chloride 0.9 % bolus 1,000 mL (1,000 mLs Intravenous New Bag/Given 06/15/16 1948)  iopamidol (ISOVUE-300) 61 % injection (100 mLs  Contrast Given 06/15/16 2017)     Initial Impression / Assessment and Plan / ED Course  I have reviewed the triage vital signs and the nursing notes.  Pertinent labs & imaging results that were available during my care of the patient were reviewed by me and considered in my medical decision making (see chart for details).    the pt  presented to the emergency room with complaints of severe abdominal pain. Laboratory tests were notable for elevated LFTs as well as an elevated lipase. CT scan does demonstrate pancreatitis without necrosis. There is also evidence of hepatomegaly and hepatic steatosis.  The etiology of her pancreatitis is unclear. She denies any significant alcohol use. There is no evidence to suggest any biliary obstruction. I will add on a lipid panel and hepatitis panel. I will consult the medical service for admission.   Final Clinical Impressions(s) / ED Diagnoses   Final diagnoses:  Acute pancreatitis without infection or necrosis, unspecified pancreatitis type      Dorie Rank, MD 06/15/16 2110

## 2016-06-15 NOTE — ED Notes (Signed)
Attempted report x1. 

## 2016-06-15 NOTE — H&P (Signed)
History and Physical  Patient Name: Kelsey Swanson     AST:[id number]    DOB: Jul 30, 1970    DOA: 06/15/2016 PCP: No PCP Per Patient   Patient coming from: Home  Chief Complaint: Epigastric pain  HPI: Kelsey Swanson is a 46 y.o. female with a past medical history significant for ectopic pregnancy and fatty liver who presents with abdominal pain.  The patient was in her usual state of health until 3 days ago when she developed diffuse abdominal pain, nausea, and intermittent vomiting. Since Sunday, all the symptoms are gotten progressively worse, until last night she couldn't sleep at all because of severe epigastric pain, vomiting.  ED course: -Afebrile, heart rate 106, respirations 22, blood pressure 110/78, pulse oximetry 96% on room air -Na 133, K 3.7, Cr 0.65, WBC 9.8K, Hgb 14 -AST and ALT 150 and 155 respectively, total bilirubin 2.6 -Lipase 186 -Troponin negative -Urinalysis shows squams, bacteria -Urine pregnancy negative -CT of abdomen and pelvis showed severe pancreatitis, steatosis -She was given dilaudid and a fluid bolus and TRH were asked to evlauate for pancreatitis  She's had no fever, hematemesis, hematochezia, melena.  She drinks alcohol to excess on weekends (>10 drinks most Fri, Sat), also occasionally 2-4 drinks on weekdays.  Has been told in the past she has fatty liver, doesn't know specifics.  No previous gallbladder disesase.       ROS: Review of Systems  Constitutional: Positive for chills. Negative for fever and malaise/fatigue.  Respiratory: Negative for cough and shortness of breath.   Cardiovascular: Negative for chest pain.  Gastrointestinal: Positive for abdominal pain, nausea and vomiting. Negative for blood in stool, constipation, diarrhea and melena.  Neurological: Negative for weakness.  All other systems reviewed and are negative.         Past Medical History:  Diagnosis Date  . Abnormal Pap smear 1996  . Fatty liver     Past  Surgical History:  Procedure Laterality Date  . BREAST SURGERY     right breast  . 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  The patient walks unassisted.  She is from [place] originally.  She smokes.  She drinks alcohol as described above.  Denies IVDU.  No Known Allergies  Family history: family history includes Diabetes in her father, maternal aunt, and maternal grandmother.  Prior to Admission medications   Medication Sig Start Date End Date Taking? Authorizing Provider  acetaminophen (TYLENOL) 500 MG tablet Take 500 mg by mouth every 6 (six) hours as needed (for pain).   Yes Historical Provider, MD  Multiple Vitamins-Minerals (ALIVE ONCE DAILY WOMENS) TABS Take 1 tablet by mouth daily.   Yes Historical Provider, MD  norelgestromin-ethinyl estradiol (ORTHO EVRA) 150-35 MCG/24HR transdermal patch Place 1 patch onto the skin once a week. 01/21/16  Yes Chancy Milroy, MD       Physical Exam: BP 106/72   Pulse 94   Temp 98.7 F (37.1 C) (Oral)   Resp (!) 23   LMP 05/23/2016 (Exact Date)   SpO2 96%  General appearance: Well-developed, obese adult female, alert and in mild distress from pain.   Eyes: Anicteric, conjunctiva pink, lids and lashes normal. PERRL.    ENT: No nasal deformity, discharge, epistaxis.  Hearing normal. OP moist without lesions.   Neck: No neck masses.  Trachea midline.  No thyromegaly/tenderness. Lymph: No cervical or supraclavicular lymphadenopathy. Skin: Warm and dry.  No jaundice.  No suspicious rashes or lesions. Cardiac: RRR, nl S1-S2, no murmurs appreciated.  Capillary refill is brisk.  JVP not visible.  No LE edema.  Radial and DP pulses 2+ and symmetric. Respiratory: Normal respiratory rate and rhythm.  CTAB without rales or wheezes. Abdomen: Abdomen soft.  Moderate to severe TTP with gaurding in epigastrium. No ascites, distension, hepatosplenomegaly.   MSK: No deformities or  effusions.  No cyanosis or clubbing. Neuro: Cranial nerves normal.  Sensation intact to light touch. Speech is fluent.  Muscle strength normal.    Psych: Sensorium intact and responding to questions, attention normal.  Behavior appropriate.  Affect normal.  Judgment and insight appear normal.     Labs on Admission:  I have personally reviewed following labs and imaging studies: CBC:  Recent Labs Lab 06/15/16 1429  WBC 9.8  HGB 14.0  HCT 38.6  MCV 90.2  PLT 557   Basic Metabolic Panel:  Recent Labs Lab 06/15/16 1429  NA 133*  K 3.7  CL 102  CO2 19*  GLUCOSE 169*  BUN <5*  CREATININE 0.65  CALCIUM 8.1*   GFR: CrCl cannot be calculated (Unknown ideal weight.).  Liver Function Tests:  Recent Labs Lab 06/15/16 1429  AST 150*  ALT 155*  ALKPHOS 85  BILITOT 2.6*  PROT 7.1  ALBUMIN 4.0    Recent Labs Lab 06/15/16 1429  LIPASE 186*   No results for input(s): AMMONIA in the last 168 hours. Coagulation Profile: No results for input(s): INR, PROTIME in the last 168 hours. Cardiac Enzymes: No results for input(s): CKTOTAL, CKMB, CKMBINDEX, TROPONINI in the last 168 hours. BNP (last 3 results) No results for input(s): PROBNP in the last 8760 hours. HbA1C: No results for input(s): HGBA1C in the last 72 hours. CBG: No results for input(s): GLUCAP in the last 168 hours. Lipid Profile: No results for input(s): CHOL, HDL, LDLCALC, TRIG, CHOLHDL, LDLDIRECT in the last 72 hours. Thyroid Function Tests: No results for input(s): TSH, T4TOTAL, FREET4, T3FREE, THYROIDAB in the last 72 hours. Anemia Panel: No results for input(s): VITAMINB12, FOLATE, FERRITIN, TIBC, IRON, RETICCTPCT in the last 72 hours. Sepsis Labs Invalid input(s): PROCALCITONIN, LACTICIDVEN No results found for this or any previous visit (from the past 240 hour(s)).       Radiological Exams on Admission: Personally reviewed CXR shows no focal opacity, CT report reviewed: Dg Chest 2  View  Result Date: 06/15/2016 CLINICAL DATA:  Acute onset of generalized abdominal pain, diarrhea and vomiting. Initial encounter. EXAM: CHEST  2 VIEW COMPARISON:  None. FINDINGS: The lungs are well-aerated and clear. There is no evidence of focal opacification, pleural effusion or pneumothorax. The heart is normal in size; the mediastinal contour is within normal limits. No acute osseous abnormalities are seen. IMPRESSION: No acute cardiopulmonary process seen. Electronically Signed   By: Garald Balding M.D.   On: 06/15/2016 18:13   Ct Abdomen Pelvis W Contrast  Result Date: 06/15/2016 CLINICAL DATA:  Centralized abdominal pain, nausea and vomiting for 3 days. Elevated lipase and liver function tests. Six month postpartum. EXAM: CT ABDOMEN AND PELVIS WITH CONTRAST TECHNIQUE: Multidetector CT imaging of the abdomen and pelvis was performed using the standard protocol following bolus administration of intravenous contrast. CONTRAST:  160mL ISOVUE-300 IOPAMIDOL (ISOVUE-300) INJECTION 61% COMPARISON:  None. FINDINGS: LOWER CHEST: Lung bases are clear. Included heart size is normal. No pericardial effusion. HEPATOBILIARY: Diffusely hypodense, mildly enlarged liver. Normal gallbladder. PANCREAS: Peripancreatic inflammation without focal necrosis, ductal dilatation, pseudocyst or pancreatic mass. SPLEEN:  Normal. ADRENALS/URINARY TRACT: Kidneys are orthotopic, demonstrating symmetric enhancement. No nephrolithiasis, hydronephrosis or solid renal masses. The unopacified ureters are normal in course and caliber. Delayed imaging through the kidneys demonstrates symmetric prompt contrast excretion within the proximal urinary collecting system. Urinary bladder is partially distended and unremarkable. Normal adrenal glands. STOMACH/BOWEL: Large duodenum diverticulum. The stomach, small and large bowel are normal in course and caliber without inflammatory changes. Normal appendix. VASCULAR/LYMPHATIC: Aortoiliac vessels are  normal in course and caliber, trace calcific atherosclerosis. No lymphadenopathy by CT size criteria. REPRODUCTIVE: Normal. OTHER: Moderate volume ascites RIGHT abdomen small amount of retroperitoneal fluid. MUSCULOSKELETAL: Nonacute.  Small fat containing umbilical hernia. IMPRESSION: Severe pancreatitis without focal necrosis or immediate complication. Moderate ascites upper abdomen.  No focal fluid collection. Mild hepatomegaly and severe hepatic steatosis versus hepatitis. Electronically Signed   By: Elon Alas M.D.   On: 06/15/2016 20:46    EKG: Independently reviewed. Rate 107, QTc 427, normal sinus rhythm.        Assessment/Plan  1. Acute pancreatitis with elevated LFTs:  Alcohol vs gallstone.  Transaminases not in alcoholic pattern but reported use history of binges.     -MRCP ordered -NPO, ice chips and MIVF -Dilaudid for pain, ondansetron for nausea -Trend LFTs -F/u lipid panel and hepatitis panel -Abstinence from alcohol recommended -Smoking cessation recmomended, modalities discussed  2. Hyponatremia:  The setting of pancreatitis. -Trend BMP with fluids              DVT prophylaxis: Lovenox  Code Status: FULL  Family Communication: None present  Disposition Plan: Anticipate IV fludis, conservative mgmt of pancreatitis.  MRCP and then consult to GI if stone observed. Consults called: None Admission status: INPATIENT        Medical decision making: Patient seen at 9:30 PM on 06/15/2016.  The patient was discussed with Dr. Tomi Bamberger.  What exists of the patient's chart was reviewed in depth and summarized above.  Clinical condition: stable.        Edwin Dada [facility] Pager [phone]

## 2016-06-16 ENCOUNTER — Inpatient Hospital Stay (HOSPITAL_COMMUNITY): Payer: Medicaid Other

## 2016-06-16 DIAGNOSIS — K852 Alcohol induced acute pancreatitis without necrosis or infection: Secondary | ICD-10-CM

## 2016-06-16 LAB — COMPREHENSIVE METABOLIC PANEL
ALT: 84 U/L — ABNORMAL HIGH (ref 14–54)
AST: 47 U/L — ABNORMAL HIGH (ref 15–41)
Albumin: 3 g/dL — ABNORMAL LOW (ref 3.5–5.0)
Alkaline Phosphatase: 66 U/L (ref 38–126)
Anion gap: 9 (ref 5–15)
BUN: <5 mg/dL — ABNORMAL LOW (ref 6–20)
CO2: 22 mmol/L (ref 22–32)
Calcium: 7.1 mg/dL — ABNORMAL LOW (ref 8.9–10.3)
Chloride: 105 mmol/L (ref 101–111)
Creatinine, Ser: 0.52 mg/dL (ref 0.44–1.00)
GFR calc Af Amer: >60 mL/min (ref >60)
GFR calc non Af Amer: >60 mL/min (ref >60)
Glucose, Bld: 100 mg/dL — ABNORMAL HIGH (ref 65–99)
Potassium: 2.6 mmol/L — CL (ref 3.5–5.1)
Sodium: 136 mmol/L (ref 135–145)
Total Bilirubin: 1.4 mg/dL — ABNORMAL HIGH (ref 0.3–1.2)
Total Protein: 5.8 g/dL — ABNORMAL LOW (ref 6.5–8.1)

## 2016-06-16 MED ORDER — GADOBENATE DIMEGLUMINE 529 MG/ML IV SOLN
20.0000 mL | Freq: Once | INTRAVENOUS | Status: AC
  Administered 2016-06-16: 16 mL via INTRAVENOUS

## 2016-06-16 MED ORDER — HYDROMORPHONE HCL 1 MG/ML IJ SOLN
1.0000 mg | INTRAMUSCULAR | Status: AC | PRN
  Administered 2016-06-16 (×4): 1 mg via INTRAVENOUS
  Filled 2016-06-16 (×4): qty 1

## 2016-06-16 MED ORDER — SODIUM CHLORIDE 0.9 % IV SOLN
30.0000 meq | Freq: Once | INTRAVENOUS | Status: AC
  Administered 2016-06-16: 30 meq via INTRAVENOUS
  Filled 2016-06-16: qty 15

## 2016-06-16 NOTE — Progress Notes (Signed)
New Admission Note:  Arrival Method: On stretcher from ED.  Mental Orientation: Alert and Oriented x4 Telemetry: N/A Assessment: Completed Skin: Refer to flowsheet IV: Right AC.  Pain: 8/10 Tubes: None Safety Measures: Safety Fall Prevention Plan discussed with patient. Admission: Completed [facility] Orientation: Patient has been orientated to the room, unit and the staff.  Orders have been reviewed and implemented. Will continue to monitor the patient. Call light has been placed within reach. Vassie Moselle, RN  Phone Number: [phone]

## 2016-06-16 NOTE — Progress Notes (Signed)
CRITICAL VALUE ALERT  Critical value received:  2.6  Date of notification:  4/25  Time of notification:  9:25  Critical value read back:Yes.    Nurse who received alert:  Kathyrn Sheriff  MD notified (1st page):  Samtani   Time of first page:  9:25  MD notified (2nd page):  Time of second page:  Responding MD:  Verlon Au  Time MD responded:  9:25

## 2016-06-16 NOTE — Progress Notes (Signed)
PROGRESS NOTE    Kelsey Swanson  ASN:[id number] DOB: 02-02-1971 DOA: 06/15/2016 PCP: No PCP Per Patient  Outpatient Specialists:     Brief Narrative:  46 year old female with history of ectopic regnancy Fatty liver->10 drinks on weekends Admitted 06/16/16 with diffuse abdominal pain nausea and intermittent vomiting and worsening  In the emergency room AST/ALT 150/155, total bili 2.6, lipase 186, CT abdomen pelvis showed severe pancreatitis steatosis  MRCP ordered patient based nothing by mouth hepatitis panel ordered IV fluids ordered and pain management    Assessment & Plan:   Principal Problem:   Acute pancreatitis without infection or necrosis Active Problems:   Hyponatremia   Elevated LFTs   Acute pancreatitis related to chronic heavy drinking-continue IV fluids 80 cc hour, MRCP shows severe pancreatitis without evidence of biliary duct dilatation or choledocholithiasis. I have mentioned to her that she probably we will need to quit drinking. Patient may have a diet  Impaired glucose tolerance-blood sugars 100-160 on the metastases. Needs outpatient monitoring.  Transaminitis-secondary to steatosis fatty liver. Needs to quit drinking  Hypokalemia-replacing with IV 30 mEq 1. Recheck labs in a.m.  Contraception because of liver dysfunction may need a different regimen other than estrogen patch and would defer to primary care physician to make the call   Lovenox Discussed with husband at bedside Inpatient Expected discharge 24- 48 hours  Consultants:   None  Procedures:   MRCP abdomen 4.25  Antimicrobials:   None    Subjective: Awake alert no distress  Asking about shower and thinks will be able to eat  Objective: Vitals:   06/15/16 2245 06/15/16 2300 06/16/16 0001 06/16/16 0408  BP: 111/72 109/65 116/70   Pulse: 94 94 92 (!) 105  Resp: 20  18   Temp:   98 F (36.7 C) 98.8 F (37.1 C)  TempSrc:      SpO2: 99% 100% 99% 100%  Weight:   78.5 kg  (173 lb)   Height:   5\' 2"  (1.575 m)     Intake/Output Summary (Last 24 hours) at 06/16/16 0810 Last data filed at 06/16/16 0601  Gross per 24 hour  Intake          1758.33 ml  Output                0 ml  Net          1758.33 ml   Filed Weights   06/16/16 0001  Weight: 78.5 kg (173 lb)    Examination:  General exam: Appears calm and comfortable Nonicteric no pallor Respiratory system: Clear to auscultation. Respiratory effort normal. Cardiovascular system: S1 & S2 heard, RRR. No JVD, murmurs, rubs, gallops or clicks. No pedal edema. Gastrointestinal system: Abdomen is nondistended, soft and nontender. Mild right upper quadrant pain and tenderness to palpation    Data Reviewed: I have personally reviewed following labs and imaging studies  CBC:  Recent Labs Lab 06/15/16 1429  WBC 9.8  HGB 14.0  HCT 38.6  MCV 90.2  PLT 193   Basic Metabolic Panel:  Recent Labs Lab 06/15/16 1429  NA 133*  K 3.7  CL 102  CO2 19*  GLUCOSE 169*  BUN <5*  CREATININE 0.65  CALCIUM 8.1*   GFR: Estimated Creatinine Clearance: 88 mL/min (by C-G formula based on SCr of 0.65 mg/dL). Liver Function Tests:  Recent Labs Lab 06/15/16 1429  AST 150*  ALT 155*  ALKPHOS 85  BILITOT 2.6*  PROT 7.1  ALBUMIN 4.0  Recent Labs Lab 06/15/16 1429  LIPASE 186*   No results for input(s): AMMONIA in the last 168 hours. Coagulation Profile: No results for input(s): INR, PROTIME in the last 168 hours. Cardiac Enzymes: No results for input(s): CKTOTAL, CKMB, CKMBINDEX, TROPONINI in the last 168 hours. BNP (last 3 results) No results for input(s): PROBNP in the last 8760 hours. HbA1C: No results for input(s): HGBA1C in the last 72 hours. CBG: No results for input(s): GLUCAP in the last 168 hours. Lipid Profile:  Recent Labs  06/15/16 2123  CHOL 283*  HDL 18*  LDLCALC UNABLE TO CALCULATE IF TRIGLYCERIDE OVER 400 mg/dL  TRIG 886*  CHOLHDL 15.7   Thyroid Function Tests: No  results for input(s): TSH, T4TOTAL, FREET4, T3FREE, THYROIDAB in the last 72 hours. Anemia Panel: No results for input(s): VITAMINB12, FOLATE, FERRITIN, TIBC, IRON, RETICCTPCT in the last 72 hours. Urine analysis:    Component Value Date/Time   COLORURINE RED (A) 06/15/2016 1846   APPEARANCEUR CLOUDY (A) 06/15/2016 1846   LABSPEC 1.030 06/15/2016 1846   PHURINE 5.0 06/15/2016 1846   GLUCOSEU 150 (A) 06/15/2016 1846   HGBUR NEGATIVE 06/15/2016 1846   BILIRUBINUR SMALL (A) 06/15/2016 1846   BILIRUBINUR neg 11/13/2015 1656   KETONESUR 5 (A) 06/15/2016 1846   PROTEINUR 100 (A) 06/15/2016 1846   UROBILINOGEN negative 11/13/2015 1656   UROBILINOGEN 0.2 06/04/2009 1316   NITRITE NEGATIVE 06/15/2016 1846   LEUKOCYTESUR NEGATIVE 06/15/2016 1846   Sepsis Labs: @LABRCNTIP (procalcitonin:4,lacticidven:4)  )No results found for this or any previous visit (from the past 240 hour(s)).       Radiology Studies: Dg Chest 2 View  Result Date: 06/15/2016 CLINICAL DATA:  Acute onset of generalized abdominal pain, diarrhea and vomiting. Initial encounter. EXAM: CHEST  2 VIEW COMPARISON:  None. FINDINGS: The lungs are well-aerated and clear. There is no evidence of focal opacification, pleural effusion or pneumothorax. The heart is normal in size; the mediastinal contour is within normal limits. No acute osseous abnormalities are seen. IMPRESSION: No acute cardiopulmonary process seen. Electronically Signed   By: Garald Balding M.D.   On: 06/15/2016 18:13   Ct Abdomen Pelvis W Contrast  Result Date: 06/15/2016 CLINICAL DATA:  Centralized abdominal pain, nausea and vomiting for 3 days. Elevated lipase and liver function tests. Six month postpartum. EXAM: CT ABDOMEN AND PELVIS WITH CONTRAST TECHNIQUE: Multidetector CT imaging of the abdomen and pelvis was performed using the standard protocol following bolus administration of intravenous contrast. CONTRAST:  140mL ISOVUE-300 IOPAMIDOL (ISOVUE-300)  INJECTION 61% COMPARISON:  None. FINDINGS: LOWER CHEST: Lung bases are clear. Included heart size is normal. No pericardial effusion. HEPATOBILIARY: Diffusely hypodense, mildly enlarged liver. Normal gallbladder. PANCREAS: Peripancreatic inflammation without focal necrosis, ductal dilatation, pseudocyst or pancreatic mass. SPLEEN: Normal. ADRENALS/URINARY TRACT: Kidneys are orthotopic, demonstrating symmetric enhancement. No nephrolithiasis, hydronephrosis or solid renal masses. The unopacified ureters are normal in course and caliber. Delayed imaging through the kidneys demonstrates symmetric prompt contrast excretion within the proximal urinary collecting system. Urinary bladder is partially distended and unremarkable. Normal adrenal glands. STOMACH/BOWEL: Large duodenum diverticulum. The stomach, small and large bowel are normal in course and caliber without inflammatory changes. Normal appendix. VASCULAR/LYMPHATIC: Aortoiliac vessels are normal in course and caliber, trace calcific atherosclerosis. No lymphadenopathy by CT size criteria. REPRODUCTIVE: Normal. OTHER: Moderate volume ascites RIGHT abdomen small amount of retroperitoneal fluid. MUSCULOSKELETAL: Nonacute.  Small fat containing umbilical hernia. IMPRESSION: Severe pancreatitis without focal necrosis or immediate complication. Moderate ascites upper abdomen.  No focal fluid  collection. Mild hepatomegaly and severe hepatic steatosis versus hepatitis. Electronically Signed   By: Elon Alas M.D.   On: 06/15/2016 20:46        Scheduled Meds: . enoxaparin (LOVENOX) injection  40 mg Subcutaneous Q24H  . [START ON 06/20/2016] norelgestromin-ethinyl estradiol  1 patch Transdermal Q Sun   Continuous Infusions: . sodium chloride 125 mL/hr at 06/15/16 2357     LOS: 1 day    Time spent: [place], MD [facility] (P) [phone]   If 7PM-7AM, please contact night-coverage [email or web address] Password [email or web address] 06/16/2016, 8:10 AM

## 2016-06-17 LAB — HEPATITIS PANEL, ACUTE
HCV Ab: <0.1 s/co ratio (ref 0.0–0.9)
Hep A IgM: NEGATIVE
Hep B C IgM: NEGATIVE
Hepatitis B Surface Ag: NEGATIVE

## 2016-06-17 LAB — LIPASE, BLOOD: Lipase: 81 U/L — ABNORMAL HIGH (ref 11–51)

## 2016-06-17 LAB — COMPREHENSIVE METABOLIC PANEL
ALT: 56 U/L — ABNORMAL HIGH (ref 14–54)
AST: 33 U/L (ref 15–41)
Albumin: 2.8 g/dL — ABNORMAL LOW (ref 3.5–5.0)
Alkaline Phosphatase: 81 U/L (ref 38–126)
Anion gap: 8 (ref 5–15)
BUN: <5 mg/dL — ABNORMAL LOW (ref 6–20)
CO2: 24 mmol/L (ref 22–32)
Calcium: 7.2 mg/dL — ABNORMAL LOW (ref 8.9–10.3)
Chloride: 102 mmol/L (ref 101–111)
Creatinine, Ser: 0.44 mg/dL (ref 0.44–1.00)
GFR calc Af Amer: >60 mL/min (ref >60)
GFR calc non Af Amer: >60 mL/min (ref >60)
Glucose, Bld: 81 mg/dL (ref 65–99)
Potassium: 2.9 mmol/L — ABNORMAL LOW (ref 3.5–5.1)
Sodium: 134 mmol/L — ABNORMAL LOW (ref 135–145)
Total Bilirubin: 1.3 mg/dL — ABNORMAL HIGH (ref 0.3–1.2)
Total Protein: 5.5 g/dL — ABNORMAL LOW (ref 6.5–8.1)

## 2016-06-17 MED ORDER — OXYCODONE-ACETAMINOPHEN 5-325 MG PO TABS
1.0000 | ORAL_TABLET | ORAL | Status: DC | PRN
  Administered 2016-06-17: 1 via ORAL
  Filled 2016-06-17: qty 1

## 2016-06-17 MED ORDER — OXYCODONE-ACETAMINOPHEN 5-325 MG PO TABS: 1.0000 | ORAL_TABLET | ORAL | 0 refills | Status: DC | PRN

## 2016-06-17 NOTE — Progress Notes (Signed)
Patient discharged to home, IV discontinued, AVS reviewed, prescriptions provided. Patient left floor, no assistance required.

## 2016-06-17 NOTE — Discharge Summary (Signed)
Physician Discharge Summary  Kelsey Swanson FXT:[id number] DOB: 1970-10-26 DOA: 06/15/2016  PCP: No PCP Per Patient  Admit date: 06/15/2016 Discharge date: 06/17/2016  Time spent: 35 minutes  Recommendations for Outpatient Follow-up:  1. Needs OP re-evaluaation of dark urine if occurs again 2. Consider d/c Estrogen as has fatty liver 3. Needs OP Cmet 1 week 4. Given Rx for opiates this admission  Discharge Diagnoses:  Principal Problem:   Acute pancreatitis without infection or necrosis Active Problems:   Hyponatremia   Elevated LFTs   Discharge Condition: good  Diet recommendation: soft, non fatty  Filed Weights   06/16/16 0001 06/16/16 2027  Weight: 78.5 kg (173 lb) 79.4 kg (175 lb 1.6 oz)    History of present illness:  46 year old female with history of ectopic regnancy Fatty liver->10 drinks on weekends Admitted 06/16/16 with diffuse abdominal pain nausea and intermittent vomiting and worsening  In the emergency room AST/ALT 150/155, total bili 2.6, lipase 186, CT abdomen pelvis showed severe pancreatitis steatosis  MRCP ordered patient based nothing by mouth hepatitis panel ordered IV fluids ordered and pain management  Lipase trended down, patient tolerating diet fair, some discomfort but patient wished to d/c home as has 3 kids at home  She had some transient hematuria [?] had also drunk cranberry juice--this seemed to self resolve.  She was d/c home with Rx for Percocet and instructed to follow -up with OP provider    Discharge Exam: Vitals:   06/17/16 0629 06/17/16 0931  BP: (!) 98/54 116/86  Pulse: 100 (!) 103  Resp: 18 18  Temp: 98.9 F (37.2 C) 98.8 F (37.1 C)    General: eomi ncat Cardiovascular: s1 s2 no m/r/g Respiratory: clear no added sound abd soft nt nd no rebound no gaurding  Discharge Instructions   Discharge Instructions    Diet - low sodium heart healthy    Complete by:  As directed    Discharge instructions    Complete by:   As directed    Please do not drink alcohol take a soft diet for about 2-3 more days and then get follow up at your primary Md You will need lab work in about 2 weeks You should discuss with your Md regarding use of contraception as you have liver issues   Increase activity slowly    Complete by:  As directed      Current Discharge Medication List    START taking these medications   Details  oxyCODONE-acetaminophen (PERCOCET/ROXICET) 5-325 MG tablet Take 1 tablet by mouth every 4 (four) hours as needed for moderate pain. Qty: 30 tablet, Refills: 0      CONTINUE these medications which have NOT CHANGED   Details  acetaminophen (TYLENOL) 500 MG tablet Take 500 mg by mouth every 6 (six) hours as needed (for pain).    Multiple Vitamins-Minerals (ALIVE ONCE DAILY WOMENS) TABS Take 1 tablet by mouth daily.    norelgestromin-ethinyl estradiol (ORTHO EVRA) 150-35 MCG/24HR transdermal patch Place 1 patch onto the skin once a week. Qty: 3 patch, Refills: 12   Associated Diagnoses: Encounter for initial prescription of transdermal patch hormonal contraceptive device       No Known Allergies    The results of significant diagnostics from this hospitalization (including imaging, microbiology, ancillary and laboratory) are listed below for reference.    Significant Diagnostic Studies: Dg Chest 2 View  Result Date: 06/15/2016 CLINICAL DATA:  Acute onset of generalized abdominal pain, diarrhea and vomiting. Initial encounter. EXAM: CHEST  2 VIEW COMPARISON:  None. FINDINGS: The lungs are well-aerated and clear. There is no evidence of focal opacification, pleural effusion or pneumothorax. The heart is normal in size; the mediastinal contour is within normal limits. No acute osseous abnormalities are seen. IMPRESSION: No acute cardiopulmonary process seen. Electronically Signed   By: Garald Balding M.D.   On: 06/15/2016 18:13   Ct Abdomen Pelvis W Contrast  Result Date: 06/15/2016 CLINICAL  DATA:  Centralized abdominal pain, nausea and vomiting for 3 days. Elevated lipase and liver function tests. Six month postpartum. EXAM: CT ABDOMEN AND PELVIS WITH CONTRAST TECHNIQUE: Multidetector CT imaging of the abdomen and pelvis was performed using the standard protocol following bolus administration of intravenous contrast. CONTRAST:  122mL ISOVUE-300 IOPAMIDOL (ISOVUE-300) INJECTION 61% COMPARISON:  None. FINDINGS: LOWER CHEST: Lung bases are clear. Included heart size is normal. No pericardial effusion. HEPATOBILIARY: Diffusely hypodense, mildly enlarged liver. Normal gallbladder. PANCREAS: Peripancreatic inflammation without focal necrosis, ductal dilatation, pseudocyst or pancreatic mass. SPLEEN: Normal. ADRENALS/URINARY TRACT: Kidneys are orthotopic, demonstrating symmetric enhancement. No nephrolithiasis, hydronephrosis or solid renal masses. The unopacified ureters are normal in course and caliber. Delayed imaging through the kidneys demonstrates symmetric prompt contrast excretion within the proximal urinary collecting system. Urinary bladder is partially distended and unremarkable. Normal adrenal glands. STOMACH/BOWEL: Large duodenum diverticulum. The stomach, small and large bowel are normal in course and caliber without inflammatory changes. Normal appendix. VASCULAR/LYMPHATIC: Aortoiliac vessels are normal in course and caliber, trace calcific atherosclerosis. No lymphadenopathy by CT size criteria. REPRODUCTIVE: Normal. OTHER: Moderate volume ascites RIGHT abdomen small amount of retroperitoneal fluid. MUSCULOSKELETAL: Nonacute.  Small fat containing umbilical hernia. IMPRESSION: Severe pancreatitis without focal necrosis or immediate complication. Moderate ascites upper abdomen.  No focal fluid collection. Mild hepatomegaly and severe hepatic steatosis versus hepatitis. Electronically Signed   By: Elon Alas M.D.   On: 06/15/2016 20:46   Mr 3d Recon At Scanner  Result Date:  06/16/2016 CLINICAL DATA:  Epigastric pain with nausea and intermittent vomiting for 3 days. History of ectopic pregnancy and hepatic steatosis. Acute pancreatitis. EXAM: MRI ABDOMEN WITHOUT AND WITH CONTRAST (INCLUDING MRCP) TECHNIQUE: Multiplanar multisequence MR imaging of the abdomen was performed both before and after the administration of intravenous contrast. Heavily T2-weighted images of the biliary and pancreatic ducts were obtained, and three-dimensional MRCP images were rendered by post processing. CONTRAST:  47mL MULTIHANCE GADOBENATE DIMEGLUMINE 529 MG/ML IV SOLN COMPARISON:  CT 06/15/2016. FINDINGS: Lower chest: The visualized lower chest appears unremarkable. There is no significant pleural effusion. Hepatobiliary: The liver is enlarged with marked loss of signal on gradient echo opposed phase images, consistent with severe steatosis. No focal hepatic lesions are demonstrated. There is no abnormal enhancement following contrast. The gallbladder is well distended and appears normal. There is no evidence of cholelithiasis or choledocholithiasis. There is no biliary dilatation. Pancreas: Extensive peripancreatic inflammatory changes are again demonstrated with fluid extension along the duodenum and into the right pericolic gutter. The pancreas is homogeneous in signal and demonstrates uniform enhancement following contrast. There is no pancreatic ductal dilatation or focal fluid collection. The pancreatic ductal anatomy is not well delineated. No definite pancreas divisum. Spleen: Normal in size without focal abnormality. Adrenals/Urinary Tract: Both adrenal glands appear normal. Both kidneys appear normal. There is no evidence of renal mass or hydronephrosis. The bladder is not imaged. Stomach/Bowel: No evidence of bowel wall thickening, distention or surrounding inflammatory change. Vascular/Lymphatic: There are no enlarged abdominal lymph nodes. No significant vascular findings. The portal, superior  mesenteric and splenic veins are patent. Other: As above, there is peripancreatic inflammation with fluid extending into the right pericolic gutter. Musculoskeletal: No acute or significant osseous findings. IMPRESSION: 1. Acute pancreatitis with peripancreatic inflammation and fluid in the right pericolic gutter. No evidence of pancreatic necrosis or definite associated hemorrhage. Associated cause is not determined. 2. Hepatomegaly with severe steatosis. 3. No evidence of biliary dilatation or choledocholithiasis. Electronically Signed   By: Richardean Sale M.D.   On: 06/16/2016 11:13   Mr Abdomen Mrcp Moise Boring Contast  Result Date: 06/16/2016 CLINICAL DATA:  Epigastric pain with nausea and intermittent vomiting for 3 days. History of ectopic pregnancy and hepatic steatosis. Acute pancreatitis. EXAM: MRI ABDOMEN WITHOUT AND WITH CONTRAST (INCLUDING MRCP) TECHNIQUE: Multiplanar multisequence MR imaging of the abdomen was performed both before and after the administration of intravenous contrast. Heavily T2-weighted images of the biliary and pancreatic ducts were obtained, and three-dimensional MRCP images were rendered by post processing. CONTRAST:  48mL MULTIHANCE GADOBENATE DIMEGLUMINE 529 MG/ML IV SOLN COMPARISON:  CT 06/15/2016. FINDINGS: Lower chest: The visualized lower chest appears unremarkable. There is no significant pleural effusion. Hepatobiliary: The liver is enlarged with marked loss of signal on gradient echo opposed phase images, consistent with severe steatosis. No focal hepatic lesions are demonstrated. There is no abnormal enhancement following contrast. The gallbladder is well distended and appears normal. There is no evidence of cholelithiasis or choledocholithiasis. There is no biliary dilatation. Pancreas: Extensive peripancreatic inflammatory changes are again demonstrated with fluid extension along the duodenum and into the right pericolic gutter. The pancreas is homogeneous in signal and  demonstrates uniform enhancement following contrast. There is no pancreatic ductal dilatation or focal fluid collection. The pancreatic ductal anatomy is not well delineated. No definite pancreas divisum. Spleen: Normal in size without focal abnormality. Adrenals/Urinary Tract: Both adrenal glands appear normal. Both kidneys appear normal. There is no evidence of renal mass or hydronephrosis. The bladder is not imaged. Stomach/Bowel: No evidence of bowel wall thickening, distention or surrounding inflammatory change. Vascular/Lymphatic: There are no enlarged abdominal lymph nodes. No significant vascular findings. The portal, superior mesenteric and splenic veins are patent. Other: As above, there is peripancreatic inflammation with fluid extending into the right pericolic gutter. Musculoskeletal: No acute or significant osseous findings. IMPRESSION: 1. Acute pancreatitis with peripancreatic inflammation and fluid in the right pericolic gutter. No evidence of pancreatic necrosis or definite associated hemorrhage. Associated cause is not determined. 2. Hepatomegaly with severe steatosis. 3. No evidence of biliary dilatation or choledocholithiasis. Electronically Signed   By: Richardean Sale M.D.   On: 06/16/2016 11:13    Microbiology: No results found for this or any previous visit (from the past 240 hour(s)).   Labs: Basic Metabolic Panel:  Recent Labs Lab 06/15/16 1429 06/16/16 0704 06/17/16 0500  NA 133* 136 134*  K 3.7 2.6* 2.9*  CL 102 105 102  CO2 19* 22 24  GLUCOSE 169* 100* 81  BUN <5* <5* <5*  CREATININE 0.65 0.52 0.44  CALCIUM 8.1* 7.1* 7.2*   Liver Function Tests:  Recent Labs Lab 06/15/16 1429 06/16/16 0704 06/17/16 0500  AST 150* 47* 33  ALT 155* 84* 56*  ALKPHOS 85 66 81  BILITOT 2.6* 1.4* 1.3*  PROT 7.1 5.8* 5.5*  ALBUMIN 4.0 3.0* 2.8*    Recent Labs Lab 06/15/16 1429 06/17/16 0500  LIPASE 186* 81*   No results for input(s): AMMONIA in the last 168  hours. CBC:  Recent Labs Lab 06/15/16 1429  WBC  9.8  HGB 14.0  HCT 38.6  MCV 90.2  PLT 222   Cardiac Enzymes: No results for input(s): CKTOTAL, CKMB, CKMBINDEX, TROPONINI in the last 168 hours. BNP: BNP (last 3 results) No results for input(s): BNP in the last 8760 hours.  ProBNP (last 3 results) No results for input(s): PROBNP in the last 8760 hours.  CBG: No results for input(s): GLUCAP in the last 168 hours.     SignedNita Sells MD   [facility] 06/17/2016, 10:41 AM

## 2016-07-23 ENCOUNTER — Encounter: Payer: Self-pay | Admitting: Internal Medicine

## 2016-07-23 ENCOUNTER — Ambulatory Visit: Payer: Self-pay | Attending: Internal Medicine | Admitting: Internal Medicine

## 2016-07-23 VITALS — BP 101/68 | HR 74 | Temp 99.2°F | Resp 16 | Wt 170.2 lb

## 2016-07-23 DIAGNOSIS — E66811 Obesity, class 1: Secondary | ICD-10-CM

## 2016-07-23 DIAGNOSIS — E871 Hypo-osmolality and hyponatremia: Secondary | ICD-10-CM | POA: Insufficient documentation

## 2016-07-23 DIAGNOSIS — Z9889 Other specified postprocedural states: Secondary | ICD-10-CM | POA: Insufficient documentation

## 2016-07-23 DIAGNOSIS — K760 Fatty (change of) liver, not elsewhere classified: Secondary | ICD-10-CM

## 2016-07-23 DIAGNOSIS — E669 Obesity, unspecified: Secondary | ICD-10-CM | POA: Insufficient documentation

## 2016-07-23 DIAGNOSIS — Z6831 Body mass index (BMI) 31.0-31.9, adult: Secondary | ICD-10-CM | POA: Insufficient documentation

## 2016-07-23 DIAGNOSIS — Z8719 Personal history of other diseases of the digestive system: Secondary | ICD-10-CM

## 2016-07-23 DIAGNOSIS — Z833 Family history of diabetes mellitus: Secondary | ICD-10-CM | POA: Insufficient documentation

## 2016-07-23 DIAGNOSIS — L659 Nonscarring hair loss, unspecified: Secondary | ICD-10-CM

## 2016-07-23 DIAGNOSIS — Z131 Encounter for screening for diabetes mellitus: Secondary | ICD-10-CM | POA: Insufficient documentation

## 2016-07-23 DIAGNOSIS — R7989 Other specified abnormal findings of blood chemistry: Secondary | ICD-10-CM | POA: Insufficient documentation

## 2016-07-23 LAB — POCT GLYCOSYLATED HEMOGLOBIN (HGB A1C): Hemoglobin A1C: 5.3

## 2016-07-23 NOTE — Progress Notes (Signed)
Patient ID: Kelsey Swanson, female    DOB: [birth year]  MRN: [id number]  CC: Hospitalization Follow-up   Subjective: Kelsey Swanson is a 46 y.o. female who presents for hosp f/u Her concerns today include:  Pt [place] 4/24-26/2018 with acute pancreatitis without infection.  See summary of hospital course below:  History of present illness:  46 year old female with history of ectopic regnancy Fatty liver->10 drinks on weekends Admitted 06/16/16 with diffuse abdominal pain nausea and intermittent vomiting and worsening  In the emergency room AST/ALT 150/155, total bili 2.6, lipase 186, CT abdomen pelvis showed severe pancreatitis steatosis  MRCP ordered patient based nothing by mouth hepatitis panel ordered IV fluids ordered and pain management  Lipase trended down, patient tolerating diet fair, some discomfort but patient wished to d/c home as has 3 kids at home  She had some transient hematuria [?] had also drunk cranberry juice--this seemed to self resolve.  She was d/c home with Rx for Percocet and instructed to follow -up with OP provider  Today 1. Pancreatitis:  Abdominal pain has resolved -she quit drinking and smoking  completely   2.  Fatty Liver -she changed BCP to Nexplanon last mth at [facility].  -has stationary bike, uses 2-3 x a wk for exercise.  She is trying to lose weight.  3.  Hair loss x 3-4 mths -gave birth in 11/2015. -no use of hair chemicals in 3 yrs and no new hair products  Patient Active Problem List   Diagnosis Date Noted  . Diabetes mellitus screening 07/23/2016  . Acute pancreatitis without infection or necrosis 06/15/2016  . Hyponatremia 06/15/2016  . Elevated LFTs 06/15/2016  . Contraception 01/21/2016  . Non-English speaking patient 12/04/2015  . OBESITY, NOS 04/21/2006     Current Outpatient Prescriptions on File Prior to Visit  Medication Sig Dispense Refill  . Multiple Vitamins-Minerals (ALIVE ONCE DAILY WOMENS) TABS Take 1 tablet by  mouth daily.     No current facility-administered medications on file prior to visit.     No Known Allergies  Social History   Social History  . Marital status: Married    Spouse name: N/A  . Number of children: N/A  . Years of education: N/A   Occupational History  . Not on file.   Social History Main Topics  . Smoking status: Former Smoker    Packs/day: 0.30    Years: 10.00    Types: Cigarettes    Quit date: 06/07/2015  . Smokeless tobacco: Never Used  . Alcohol use Yes     Comment: socially  . Drug use: No  . Sexual activity: Yes    Partners: Male    Birth control/ protection: None     Comment: early preg   Other Topics Concern  . Not on file   Social History Narrative  . No narrative on file    Family History  Problem Relation Age of Onset  . Diabetes Father   . Diabetes Maternal Aunt   . Diabetes Maternal Grandmother     Past Surgical History:  Procedure Laterality Date  . BREAST SURGERY     right breast  . ECTOPIC PREGNANCY SURGERY  2012    ROS: Review of Systems  Constitutional: Negative for fatigue and fever.  Respiratory: Negative for shortness of breath.   Cardiovascular: Negative for chest pain.  Gastrointestinal: Negative for abdominal pain and blood in stool.    PHYSICAL EXAM: BP 101/68   Pulse 74   Temp 99.2 F (37.3  C) (Oral)   Resp 16   Wt 170 lb 3.2 oz (77.2 kg)   SpO2 98%   BMI 31.13 kg/m   Wt Readings from Last 3 Encounters:  07/23/16 170 lb 3.2 oz (77.2 kg)  06/16/16 175 lb 1.6 oz (79.4 kg)  12/16/15 190 lb (86.2 kg)  pt was pregnant in 11/2015  Physical Exam Constitutional: Young to middle-age female, Appears well-developed and well-nourished. No distress. Head: Normocephalic. Atraumatic Eyes: Conjunctivae and EOM are normal. PERRLA, no scleral icterus.  Mouth: no oral lesions, good oral hygiene, throat clear without exudates Neck: Neck supple.  No tracheal deviation. No thyromegaly. No cervical LN CVS: RRR, S1/S2  +, no murmurs, no gallops, no carotid bruit. No JVD Pulmonary: Effort and breath sounds normal, no stridor, rhonchi, wheezes, rales.  Abdominal: Soft. BS +,  no distension, tenderness, rebound or guarding. No organomegaly or masses Musculoskeletal: Normal range of motion. No edema and no tenderness.  Skin: Skin is warm and dry. No rash noted. Not diaphoretic. No erythema. No pallor.  Hair: Mild thickening noted in the frontal area  Psychiatric: Normal mood and affect. Behavior, judgment, thought content normal.    Results for orders placed or performed in visit on 07/23/16  POCT glycosylated hemoglobin (Hb A1C)  Result Value Ref Range   Hemoglobin A1C 5.3      GAD 7 : Generalized Anxiety Score 07/23/2016  Nervous, Anxious, on Edge 0  Control/stop worrying 0  Worry too much - different things 0  Trouble relaxing 0  Restless 0  Easily annoyed or irritable 0  Afraid - awful might happen 0  Total GAD 7 Score 0     Depression screen PHQ 2/9 07/23/2016  Decreased Interest 0  Down, Depressed, Hopeless 0  PHQ - 2 Score 0   ASSESSMENT AND PLAN: 1. Hx of pancreatitis 2. Fatty liver -Pancreatitis resolved. Stressed the importance of abstinence from alcohol. Commended her on sobriety so far. She does not feel she needs any outpatient therapy/counseling at this time. -Discussed the importance of healthy eating habits regular exercise 3-4 times a week for 30 minutes and weight loss.  Printed information given on healthy eating habits - Comprehensive metabolic panel  3. Diabetes mellitus screening -Negative - POCT glycosylated hemoglobin (Hb A1C)  4. Hair loss -Likely due to hormonal changes associated with recent pregnancy - TSH  5. Obesity (BMI 30.0-34.9) -See plan under #2 above   Patient was given the opportunity to ask questions.  Patient verbalized understanding of the plan and was able to repeat key elements of the plan.   Orders Placed This Encounter  Procedures  .  Comprehensive metabolic panel  . TSH  . POCT glycosylated hemoglobin (Hb A1C)     Requested Prescriptions    No prescriptions requested or ordered in this encounter    Return if symptoms worsen or fail to improve.  Karle Plumber, MD, FACP

## 2016-07-23 NOTE — Patient Instructions (Signed)

## 2016-07-24 LAB — COMPREHENSIVE METABOLIC PANEL
ALT: 32 IU/L (ref 0–32)
AST: 16 IU/L (ref 0–40)
Albumin/Globulin Ratio: 2.1 (ref 1.2–2.2)
Albumin: 5.2 g/dL (ref 3.5–5.5)
Alkaline Phosphatase: 70 IU/L (ref 39–117)
BUN/Creatinine Ratio: 24 — ABNORMAL HIGH (ref 9–23)
BUN: 12 mg/dL (ref 6–24)
Bilirubin Total: 0.5 mg/dL (ref 0.0–1.2)
CO2: 22 mmol/L (ref 18–29)
Calcium: 9.5 mg/dL (ref 8.7–10.2)
Chloride: 106 mmol/L (ref 96–106)
Creatinine, Ser: 0.51 mg/dL — ABNORMAL LOW (ref 0.57–1.00)
GFR calc Af Amer: 136 mL/min/{1.73_m2} (ref >59)
GFR calc non Af Amer: 118 mL/min/{1.73_m2} (ref >59)
Globulin, Total: 2.5 g/dL (ref 1.5–4.5)
Glucose: 102 mg/dL — ABNORMAL HIGH (ref 65–99)
Potassium: 4.6 mmol/L (ref 3.5–5.2)
Sodium: 141 mmol/L (ref 134–144)
Total Protein: 7.7 g/dL (ref 6.0–8.5)

## 2016-07-24 LAB — TSH: TSH: 0.799 u[IU]/mL (ref 0.450–4.500)

## 2016-07-26 ENCOUNTER — Telehealth: Payer: Self-pay

## 2016-07-26 NOTE — Telephone Encounter (Signed)
Contacted pt to go over lab results pt didn't answer lvm asking pt to give me a call at her earliest convenience   If pt calls back please give results: liver studies and potassium level have returned to normal. Thyroid level was in the low normal range. Will recheck thyroid level in 6 months.

## 2016-07-27 ENCOUNTER — Telehealth: Payer: Self-pay | Admitting: Internal Medicine

## 2016-07-27 NOTE — Telephone Encounter (Signed)
Patient called to review results, pt understood and will f/up in 6 months to recheck thyroid.

## 2017-04-06 ENCOUNTER — Inpatient Hospital Stay (HOSPITAL_COMMUNITY)
Admission: EM | Admit: 2017-04-06 | Discharge: 2017-04-11 | DRG: 439 | Disposition: A | Discharge status: Home / Self Care | Payer: Medicaid Other | Attending: Internal Medicine | Admitting: Internal Medicine

## 2017-04-06 ENCOUNTER — Other Ambulatory Visit: Payer: Self-pay

## 2017-04-06 ENCOUNTER — Encounter (HOSPITAL_COMMUNITY): Payer: Self-pay | Admitting: Emergency Medicine

## 2017-04-06 DIAGNOSIS — R945 Abnormal results of liver function studies: Secondary | ICD-10-CM

## 2017-04-06 DIAGNOSIS — K7011 Alcoholic hepatitis with ascites: Secondary | ICD-10-CM | POA: Diagnosis present

## 2017-04-06 DIAGNOSIS — Z79899 Other long term (current) drug therapy: Secondary | ICD-10-CM

## 2017-04-06 DIAGNOSIS — E861 Hypovolemia: Secondary | ICD-10-CM | POA: Diagnosis present

## 2017-04-06 DIAGNOSIS — R17 Unspecified jaundice: Secondary | ICD-10-CM | POA: Diagnosis present

## 2017-04-06 DIAGNOSIS — R109 Unspecified abdominal pain: Secondary | ICD-10-CM

## 2017-04-06 DIAGNOSIS — Z87891 Personal history of nicotine dependence: Secondary | ICD-10-CM

## 2017-04-06 DIAGNOSIS — K7010 Alcoholic hepatitis without ascites: Secondary | ICD-10-CM | POA: Diagnosis present

## 2017-04-06 DIAGNOSIS — K859 Acute pancreatitis without necrosis or infection, unspecified: Secondary | ICD-10-CM | POA: Diagnosis present

## 2017-04-06 DIAGNOSIS — R7989 Other specified abnormal findings of blood chemistry: Secondary | ICD-10-CM | POA: Diagnosis present

## 2017-04-06 DIAGNOSIS — Z6832 Body mass index (BMI) 32.0-32.9, adult: Secondary | ICD-10-CM

## 2017-04-06 DIAGNOSIS — E669 Obesity, unspecified: Secondary | ICD-10-CM | POA: Diagnosis present

## 2017-04-06 DIAGNOSIS — E785 Hyperlipidemia, unspecified: Secondary | ICD-10-CM | POA: Diagnosis present

## 2017-04-06 DIAGNOSIS — E871 Hypo-osmolality and hyponatremia: Secondary | ICD-10-CM | POA: Diagnosis present

## 2017-04-06 DIAGNOSIS — E66811 Obesity, class 1: Secondary | ICD-10-CM | POA: Diagnosis present

## 2017-04-06 DIAGNOSIS — D649 Anemia, unspecified: Secondary | ICD-10-CM

## 2017-04-06 DIAGNOSIS — E876 Hypokalemia: Secondary | ICD-10-CM

## 2017-04-06 DIAGNOSIS — K852 Alcohol induced acute pancreatitis without necrosis or infection: Secondary | ICD-10-CM | POA: Diagnosis present

## 2017-04-06 DIAGNOSIS — K760 Fatty (change of) liver, not elsewhere classified: Secondary | ICD-10-CM | POA: Diagnosis present

## 2017-04-06 DIAGNOSIS — D696 Thrombocytopenia, unspecified: Secondary | ICD-10-CM | POA: Diagnosis present

## 2017-04-06 LAB — I-STAT BETA HCG BLOOD, ED (MC, WL, AP ONLY): I-stat hCG, quantitative: <5 m[IU]/mL (ref <5)

## 2017-04-06 LAB — COMPREHENSIVE METABOLIC PANEL
ALT: 131 U/L — ABNORMAL HIGH (ref 14–54)
AST: 138 U/L — ABNORMAL HIGH (ref 15–41)
Albumin: 3.3 g/dL — ABNORMAL LOW (ref 3.5–5.0)
Alkaline Phosphatase: 327 U/L — ABNORMAL HIGH (ref 38–126)
Anion gap: 13 (ref 5–15)
BUN: <5 mg/dL — ABNORMAL LOW (ref 6–20)
CO2: 20 mmol/L — ABNORMAL LOW (ref 22–32)
Calcium: 6.5 mg/dL — ABNORMAL LOW (ref 8.9–10.3)
Chloride: 96 mmol/L — ABNORMAL LOW (ref 101–111)
Creatinine, Ser: 0.59 mg/dL (ref 0.44–1.00)
GFR calc Af Amer: >60 mL/min (ref >60)
GFR calc non Af Amer: >60 mL/min (ref >60)
Glucose, Bld: 123 mg/dL — ABNORMAL HIGH (ref 65–99)
Potassium: 3.5 mmol/L (ref 3.5–5.1)
Sodium: 129 mmol/L — ABNORMAL LOW (ref 135–145)
Total Bilirubin: 4.6 mg/dL — ABNORMAL HIGH (ref 0.3–1.2)
Total Protein: 6.7 g/dL (ref 6.5–8.1)

## 2017-04-06 LAB — URINALYSIS, ROUTINE W REFLEX MICROSCOPIC
Glucose, UA: 50 mg/dL — AB
Hgb urine dipstick: NEGATIVE
Ketones, ur: 5 mg/dL — AB
Leukocytes, UA: NEGATIVE
Nitrite: NEGATIVE
Protein, ur: >=300 mg/dL — AB
Specific Gravity, Urine: 1.025 (ref 1.005–1.030)
pH: 5 (ref 5.0–8.0)

## 2017-04-06 LAB — CBC
HCT: 37.1 % (ref 36.0–46.0)
Hemoglobin: 11.6 g/dL — ABNORMAL LOW (ref 12.0–15.0)
MCH: 30.7 pg (ref 26.0–34.0)
MCHC: 31.3 g/dL (ref 30.0–36.0)
MCV: 98.1 fL (ref 78.0–100.0)
Platelets: 85 10*3/uL — ABNORMAL LOW (ref 150–400)
RBC: 3.78 MIL/uL — ABNORMAL LOW (ref 3.87–5.11)
RDW: 14.8 % (ref 11.5–15.5)
WBC: 8.5 10*3/uL (ref 4.0–10.5)

## 2017-04-06 LAB — LIPASE, BLOOD: Lipase: 279 U/L — ABNORMAL HIGH (ref 11–51)

## 2017-04-06 MED ORDER — SODIUM CHLORIDE 0.9 % IV BOLUS (SEPSIS)
1000.0000 mL | Freq: Once | INTRAVENOUS | Status: AC
  Administered 2017-04-06: 1000 mL via INTRAVENOUS

## 2017-04-06 MED ORDER — HYDROMORPHONE HCL 1 MG/ML IJ SOLN
0.5000 mg | Freq: Once | INTRAMUSCULAR | Status: AC
  Administered 2017-04-06: 0.5 mg via INTRAVENOUS
  Filled 2017-04-06: qty 1

## 2017-04-06 MED ORDER — ONDANSETRON HCL 4 MG/2ML IJ SOLN
4.0000 mg | Freq: Once | INTRAMUSCULAR | Status: AC
  Administered 2017-04-06: 4 mg via INTRAVENOUS
  Filled 2017-04-06: qty 2

## 2017-04-06 MED ORDER — OXYCODONE-ACETAMINOPHEN 5-325 MG PO TABS
1.0000 | ORAL_TABLET | ORAL | Status: DC | PRN
  Administered 2017-04-06: 1 via ORAL
  Filled 2017-04-06: qty 1

## 2017-04-06 MED ORDER — IOPAMIDOL (ISOVUE-300) INJECTION 61%
INTRAVENOUS | Status: AC
  Administered 2017-04-06: 100 mL
  Filled 2017-04-06: qty 100

## 2017-04-06 NOTE — ED Provider Notes (Signed)
[facility] EMERGENCY DEPARTMENT Provider Note   CSN: [id number] Arrival date & time: 04/06/17  [place]     History   Chief Complaint Chief Complaint  Patient presents with  . Pancreatitis  . Abdominal Pain    HPI   Blood pressure 99/76, pulse (!) 125, temperature 98.7 F (37.1 C), temperature source Oral, resp. rate 16, height 5\' 2"  (1.575 m), weight 81.6 kg (180 lb), SpO2 100 %, unknown if currently breastfeeding.  Kelsey Swanson is a 47 y.o. female complaining of severe diffuse abdominal pain onset 3 days ago, has been n.p.o. because pain is so severe she denies fevers, chills, vomiting.  She states that she drank heavily over the weekend and she believes her issues are coming from pancreatitis which she has had in the past.  She states that she does not drink every day, but does not consider herself to be an alcoholic.  Does not follow regularly with gastroenterology.  Communication via husband who translates from [place].   Past Medical History:  Diagnosis Date  . Abnormal Pap smear 1996  . Fatty liver     Patient Active Problem List   Diagnosis Date Noted  . Diabetes mellitus screening 07/23/2016  . Fatty liver 07/23/2016  . Obesity (BMI 30.0-34.9) 07/23/2016  . Acute pancreatitis without infection or necrosis 06/15/2016  . Hyponatremia 06/15/2016  . Elevated LFTs 06/15/2016  . Contraception 01/21/2016  . Non-English speaking patient 12/04/2015  . OBESITY, NOS 04/21/2006    Past Surgical History:  Procedure Laterality Date  . BREAST SURGERY     right breast  . ECTOPIC PREGNANCY SURGERY  2012    OB History    Gravida Para Term Preterm AB Living   5 3 3   2 3    SAB TAB Ectopic Multiple Live Births   1   1 0 1       Home Medications    Prior to Admission medications   Medication Sig Start Date End Date Taking? Authorizing Provider  Multiple Vitamins-Minerals (ALIVE ONCE DAILY WOMENS) TABS Take 1 tablet by mouth daily.    [provider]    Family History Family History  Problem Relation Age of Onset  . Diabetes Father   . Diabetes Maternal Aunt   . Diabetes Maternal Grandmother     Social History Social History   Tobacco Use  . Smoking status: Former Smoker    Packs/day: 0.30    Years: 10.00    Pack years: 3.00    Types: Cigarettes    Last attempt to quit: 06/07/2015    Years since quitting: 1.8  . Smokeless tobacco: Never Used  Substance Use Topics  . Alcohol use: Yes    Comment: socially  . Drug use: No     Allergies   Patient has no known allergies.   Review of Systems Review of Systems  A complete review of systems was obtained and all systems are negative except as noted in the HPI and PMH.   Physical Exam Updated Vital Signs BP 102/60   Pulse 93   Temp 98.7 F (37.1 C) (Oral)   Resp 16   Ht 5\' 2"  (1.575 m)   Wt 81.6 kg (180 lb)   SpO2 100%   BMI 32.92 kg/m   Physical Exam  Constitutional: She is oriented to person, place, and time. She appears well-developed and well-nourished. No distress.  HENT:  Head: Normocephalic and atraumatic.  Mouth/Throat: Oropharynx is clear and moist.  Eyes:  Conjunctivae and EOM are normal. Pupils are equal, round, and reactive to light.  Neck: Normal range of motion.  Cardiovascular: Normal rate, regular rhythm and intact distal pulses.  Pulmonary/Chest: Effort normal and breath sounds normal.  Abdominal: Soft. She exhibits distension. She exhibits no mass. There is tenderness. There is no guarding. No hernia.  Mild distention, diffusely tender to palpation, no discernible fluid wave  Musculoskeletal: Normal range of motion.  Neurological: She is alert and oriented to person, place, and time.  Skin: She is not diaphoretic.  Psychiatric: She has a normal mood and affect.  Nursing note and vitals reviewed.    ED Treatments / Results  Labs (all labs ordered are listed, but only abnormal results are displayed) Labs Reviewed    LIPASE, BLOOD - Abnormal; Notable for the following components:      Result Value   Lipase 279 (*)    All other components within normal limits  COMPREHENSIVE METABOLIC PANEL - Abnormal; Notable for the following components:   Sodium 129 (*)    Chloride 96 (*)    CO2 20 (*)    Glucose, Bld 123 (*)    BUN <5 (*)    Calcium 6.5 (*)    Albumin 3.3 (*)    AST 138 (*)    ALT 131 (*)    Alkaline Phosphatase 327 (*)    Total Bilirubin 4.6 (*)    All other components within normal limits  CBC - Abnormal; Notable for the following components:   RBC 3.78 (*)    Hemoglobin 11.6 (*)    Platelets 85 (*)    All other components within normal limits  URINALYSIS, ROUTINE W REFLEX MICROSCOPIC - Abnormal; Notable for the following components:   Color, Urine AMBER (*)    APPearance CLOUDY (*)    Glucose, UA 50 (*)    Bilirubin Urine SMALL (*)    Ketones, ur 5 (*)    Protein, ur >=300 (*)    Bacteria, UA RARE (*)    Squamous Epithelial / LPF 0-5 (*)    Non Squamous Epithelial 0-5 (*)    All other components within normal limits  I-STAT BETA HCG BLOOD, ED ([facility], [facility], [facility] ONLY)    EKG  EKG Interpretation None       Radiology Ct Abdomen Pelvis W Contrast  Result Date: 04/07/2017 CLINICAL DATA:  Generalized abdominal pain for 3 days; similar symptoms with prior pancreatitis. History of fatty liver. EXAM: CT ABDOMEN AND PELVIS WITH CONTRAST TECHNIQUE: Multidetector CT imaging of the abdomen and pelvis was performed using the standard protocol following bolus administration of intravenous contrast. CONTRAST:  125mL ISOVUE-300 IOPAMIDOL (ISOVUE-300) INJECTION 61% COMPARISON:  CT abdomen and pelvis June 15, 2016 and MRI of the abdomen June 16, 2016 FINDINGS: LOWER CHEST: Lung bases are clear. Included heart size is normal. No pericardial effusion. HEPATOBILIARY: The liver is mildly enlarged, diffusely hypodense similar to prior CT. Mild focal fatty sparing about the gallbladder fossa. Normal  gallbladder. PANCREAS: Severe inflammatory changes about the pancreatic body and tail. No focal necrosis, pseudocyst, ductal dilatation, mass or calcifications. SPLEEN: Normal. ADRENALS/URINARY TRACT: Kidneys are orthotopic, demonstrating symmetric enhancement. No nephrolithiasis, hydronephrosis or solid renal masses. The unopacified ureters are normal in course and caliber. Delayed imaging through the kidneys demonstrates symmetric prompt contrast excretion within the proximal urinary collecting system. Urinary bladder is well distended and unremarkable. Normal adrenal glands. STOMACH/BOWEL: The stomach, small and large bowel are normal in course and caliber without inflammatory changes.  Normal appendix. VASCULAR/LYMPHATIC: Aortoiliac vessels are normal in course and caliber. Mild calcific atherosclerosis no lymphadenopathy by CT size criteria. REPRODUCTIVE: Normal. OTHER: Moderate retroperitoneal effusion tracking to LEFT abdomen and pelvis. No focal fluid collection. No intraperitoneal free air. Phleboliths in the pelvis. Small fat containing umbilical hernia. MUSCULOSKELETAL: Nonacute. IMPRESSION: . 1. Severe acute pancreatitis without focal necrosis or immediate complication. No pseudocyst. 2. Moderate ascites without focal fluid collection. 3. Mild hepatomegaly and severe hepatic steatosis versus hepatitis. Aortic Atherosclerosis (ICD10-I70.0). Electronically Signed   By: Elon Alas M.D.   On: 04/07/2017 00:27    Procedures Procedures (including critical care time)  Medications Ordered in ED Medications  oxyCODONE-acetaminophen (PERCOCET/ROXICET) 5-325 MG per tablet 1 tablet (1 tablet Oral Given 04/06/17 1856)  sodium chloride 0.9 % bolus 1,000 mL (0 mLs Intravenous Stopped 04/07/17 0015)  HYDROmorphone (DILAUDID) injection 0.5 mg (0.5 mg Intravenous Given 04/06/17 2158)  ondansetron (ZOFRAN) injection 4 mg (4 mg Intravenous Given 04/06/17 2158)  iopamidol (ISOVUE-300) 61 % injection (100 mLs   Contrast Given 04/06/17 2330)     Initial Impression / Assessment and Plan / ED Course  I have reviewed the triage vital signs and the nursing notes.  Pertinent labs & imaging results that were available during my care of the patient were reviewed by me and considered in my medical decision making (see chart for details).     Vitals:   04/06/17 2245 04/06/17 2300 04/06/17 2315 04/06/17 2345  BP: 102/66 102/65 104/67 102/60  Pulse: 89 95 91 93  Resp:      Temp:      TempSrc:      SpO2: 100% 100% 100% 100%  Weight:      Height:        Medications  oxyCODONE-acetaminophen (PERCOCET/ROXICET) 5-325 MG per tablet 1 tablet (1 tablet Oral Given 04/06/17 1856)  sodium chloride 0.9 % bolus 1,000 mL (0 mLs Intravenous Stopped 04/07/17 0015)  HYDROmorphone (DILAUDID) injection 0.5 mg (0.5 mg Intravenous Given 04/06/17 2158)  ondansetron (ZOFRAN) injection 4 mg (4 mg Intravenous Given 04/06/17 2158)  iopamidol (ISOVUE-300) 61 % injection (100 mLs  Contrast Given 04/06/17 2330)    Kelsey Swanson is 47 y.o. female presenting with severe abdominal pain afebrile, no nausea or vomiting but she states the pain is so severe that she has not eaten in days, consistent with prior pancreatitis.  She did drink heavily over the weekend and then the symptoms started Monday.  Patient with reassuring vital signs, abdominal exam nonsurgical.  She has a mild transaminitis with AST 138/ALT 131.  T bili elevated at 4.16, negative Murphy sign.  Low platelets.  Will obtain CT abdomen pelvis.  This patient is going to need admission for pancreatitis.  Delay in obtaining CT, patient is improved after hydration and pain medication.  CT with acute and severe pancreatitis, no gallbladder abnormalities.    Final Clinical Impressions(s) / ED Diagnoses   Final diagnoses:  Acute pancreatitis, unspecified complication status, unspecified pancreatitis type  Hyponatremia    ED Discharge Orders    None         Waynetta Pean 04/07/17 0047    Soriah Leeman, Elmyra Ricks, PA-C 04/07/17 0814    Dorie Rank, MD 04/07/17 1459

## 2017-04-06 NOTE — ED Triage Notes (Signed)
Pt presents with abd pain that she describes feels like her pancreatitis; pt denies n/v/d

## 2017-04-07 ENCOUNTER — Emergency Department (HOSPITAL_COMMUNITY): Payer: Medicaid Other

## 2017-04-07 ENCOUNTER — Other Ambulatory Visit: Payer: Self-pay

## 2017-04-07 ENCOUNTER — Inpatient Hospital Stay (HOSPITAL_COMMUNITY): Payer: Medicaid Other

## 2017-04-07 ENCOUNTER — Other Ambulatory Visit (HOSPITAL_COMMUNITY): Payer: Self-pay

## 2017-04-07 ENCOUNTER — Encounter (HOSPITAL_COMMUNITY): Payer: Self-pay | Admitting: Family Medicine

## 2017-04-07 DIAGNOSIS — K852 Alcohol induced acute pancreatitis without necrosis or infection: Secondary | ICD-10-CM | POA: Diagnosis present

## 2017-04-07 DIAGNOSIS — Z87891 Personal history of nicotine dependence: Secondary | ICD-10-CM | POA: Diagnosis not present

## 2017-04-07 DIAGNOSIS — E876 Hypokalemia: Secondary | ICD-10-CM | POA: Diagnosis present

## 2017-04-07 DIAGNOSIS — D696 Thrombocytopenia, unspecified: Secondary | ICD-10-CM | POA: Diagnosis present

## 2017-04-07 DIAGNOSIS — R1032 Left lower quadrant pain: Secondary | ICD-10-CM | POA: Diagnosis not present

## 2017-04-07 DIAGNOSIS — R1012 Left upper quadrant pain: Secondary | ICD-10-CM | POA: Diagnosis not present

## 2017-04-07 DIAGNOSIS — K859 Acute pancreatitis without necrosis or infection, unspecified: Secondary | ICD-10-CM | POA: Diagnosis not present

## 2017-04-07 DIAGNOSIS — E785 Hyperlipidemia, unspecified: Secondary | ICD-10-CM | POA: Diagnosis present

## 2017-04-07 DIAGNOSIS — K7010 Alcoholic hepatitis without ascites: Secondary | ICD-10-CM

## 2017-04-07 DIAGNOSIS — R945 Abnormal results of liver function studies: Secondary | ICD-10-CM

## 2017-04-07 DIAGNOSIS — D649 Anemia, unspecified: Secondary | ICD-10-CM | POA: Diagnosis present

## 2017-04-07 DIAGNOSIS — E871 Hypo-osmolality and hyponatremia: Secondary | ICD-10-CM | POA: Diagnosis present

## 2017-04-07 DIAGNOSIS — Z6832 Body mass index (BMI) 32.0-32.9, adult: Secondary | ICD-10-CM | POA: Diagnosis not present

## 2017-04-07 DIAGNOSIS — Z79899 Other long term (current) drug therapy: Secondary | ICD-10-CM | POA: Diagnosis not present

## 2017-04-07 DIAGNOSIS — R17 Unspecified jaundice: Secondary | ICD-10-CM

## 2017-04-07 DIAGNOSIS — E669 Obesity, unspecified: Secondary | ICD-10-CM | POA: Diagnosis present

## 2017-04-07 DIAGNOSIS — K7011 Alcoholic hepatitis with ascites: Secondary | ICD-10-CM | POA: Diagnosis present

## 2017-04-07 DIAGNOSIS — K760 Fatty (change of) liver, not elsewhere classified: Secondary | ICD-10-CM | POA: Diagnosis not present

## 2017-04-07 DIAGNOSIS — E861 Hypovolemia: Secondary | ICD-10-CM | POA: Diagnosis present

## 2017-04-07 LAB — HIV ANTIBODY (ROUTINE TESTING W REFLEX): HIV Screen 4th Generation wRfx: NONREACTIVE

## 2017-04-07 LAB — COMPREHENSIVE METABOLIC PANEL
ALT: 109 U/L — ABNORMAL HIGH (ref 14–54)
ALT: 99 U/L — ABNORMAL HIGH (ref 14–54)
AST: 96 U/L — ABNORMAL HIGH (ref 15–41)
AST: 95 U/L — ABNORMAL HIGH (ref 15–41)
Albumin: 3 g/dL — ABNORMAL LOW (ref 3.5–5.0)
Albumin: 2.7 g/dL — ABNORMAL LOW (ref 3.5–5.0)
Alkaline Phosphatase: 276 U/L — ABNORMAL HIGH (ref 38–126)
Alkaline Phosphatase: 269 U/L — ABNORMAL HIGH (ref 38–126)
Anion gap: 12 (ref 5–15)
Anion gap: 10 (ref 5–15)
BUN: <5 mg/dL — ABNORMAL LOW (ref 6–20)
BUN: <5 mg/dL — ABNORMAL LOW (ref 6–20)
CO2: 20 mmol/L — ABNORMAL LOW (ref 22–32)
CO2: 19 mmol/L — ABNORMAL LOW (ref 22–32)
Calcium: 6.2 mg/dL — CL (ref 8.9–10.3)
Calcium: 7.3 mg/dL — ABNORMAL LOW (ref 8.9–10.3)
Chloride: 102 mmol/L (ref 101–111)
Chloride: 102 mmol/L (ref 101–111)
Creatinine, Ser: 0.6 mg/dL (ref 0.44–1.00)
Creatinine, Ser: 0.5 mg/dL (ref 0.44–1.00)
GFR calc Af Amer: >60 mL/min (ref >60)
GFR calc Af Amer: >60 mL/min (ref >60)
GFR calc non Af Amer: >60 mL/min (ref >60)
GFR calc non Af Amer: >60 mL/min (ref >60)
Glucose, Bld: 95 mg/dL (ref 65–99)
Glucose, Bld: 98 mg/dL (ref 65–99)
Potassium: 3.3 mmol/L — ABNORMAL LOW (ref 3.5–5.1)
Potassium: 3.8 mmol/L (ref 3.5–5.1)
Sodium: 134 mmol/L — ABNORMAL LOW (ref 135–145)
Sodium: 131 mmol/L — ABNORMAL LOW (ref 135–145)
Total Bilirubin: 3.7 mg/dL — ABNORMAL HIGH (ref 0.3–1.2)
Total Bilirubin: 3.6 mg/dL — ABNORMAL HIGH (ref 0.3–1.2)
Total Protein: 6.4 g/dL — ABNORMAL LOW (ref 6.5–8.1)
Total Protein: 6.1 g/dL — ABNORMAL LOW (ref 6.5–8.1)

## 2017-04-07 LAB — CBG MONITORING, ED
Glucose-Capillary: 80 mg/dL (ref 65–99)
Glucose-Capillary: 65 mg/dL (ref 65–99)

## 2017-04-07 LAB — CBC WITH DIFFERENTIAL/PLATELET
Basophils Absolute: 0 10*3/uL (ref 0.0–0.1)
Basophils Relative: 0 %
Eosinophils Absolute: 0.2 10*3/uL (ref 0.0–0.7)
Eosinophils Relative: 2 %
HCT: 33.1 % — ABNORMAL LOW (ref 36.0–46.0)
Hemoglobin: 10.4 g/dL — ABNORMAL LOW (ref 12.0–15.0)
Lymphocytes Relative: 27 %
Lymphs Abs: 2.1 10*3/uL (ref 0.7–4.0)
MCH: 30.5 pg (ref 26.0–34.0)
MCHC: 31.4 g/dL (ref 30.0–36.0)
MCV: 97.1 fL (ref 78.0–100.0)
Monocytes Absolute: 0.2 10*3/uL (ref 0.1–1.0)
Monocytes Relative: 3 %
Neutro Abs: 5.3 10*3/uL (ref 1.7–7.7)
Neutrophils Relative %: 68 %
Platelets: 84 10*3/uL — ABNORMAL LOW (ref 150–400)
RBC: 3.41 MIL/uL — ABNORMAL LOW (ref 3.87–5.11)
RDW: 15 % (ref 11.5–15.5)
WBC: 7.8 10*3/uL (ref 4.0–10.5)

## 2017-04-07 LAB — TRIGLYCERIDES: Triglycerides: 608 mg/dL — ABNORMAL HIGH (ref <150)

## 2017-04-07 LAB — LIPASE, BLOOD: Lipase: 321 U/L — ABNORMAL HIGH (ref 11–51)

## 2017-04-07 LAB — PROTIME-INR
INR: 1.05
Prothrombin Time: 13.6 seconds (ref 11.4–15.2)

## 2017-04-07 MED ORDER — VITAMIN B-1 100 MG PO TABS
100.0000 mg | ORAL_TABLET | Freq: Every day | ORAL | Status: DC
  Administered 2017-04-07 – 2017-04-11 (×2): 100 mg via ORAL
  Filled 2017-04-07 (×3): qty 1

## 2017-04-07 MED ORDER — ONDANSETRON HCL 4 MG PO TABS: 4.0000 mg | ORAL_TABLET | Freq: Four times a day (QID) | ORAL | Status: DC | PRN

## 2017-04-07 MED ORDER — SODIUM CHLORIDE 0.9% FLUSH
3.0000 mL | Freq: Two times a day (BID) | INTRAVENOUS | Status: DC
  Administered 2017-04-08 – 2017-04-10 (×3): 3 mL via INTRAVENOUS

## 2017-04-07 MED ORDER — POTASSIUM CHLORIDE 10 MEQ/100ML IV SOLN
10.0000 meq | INTRAVENOUS | Status: AC
  Administered 2017-04-07 (×2): 10 meq via INTRAVENOUS
  Filled 2017-04-07 (×2): qty 100

## 2017-04-07 MED ORDER — THIAMINE HCL 100 MG/ML IJ SOLN
100.0000 mg | Freq: Every day | INTRAMUSCULAR | Status: DC
  Administered 2017-04-08 – 2017-04-10 (×3): 100 mg via INTRAVENOUS
  Filled 2017-04-07 (×3): qty 2

## 2017-04-07 MED ORDER — KCL IN DEXTROSE-NACL 20-5-0.45 MEQ/L-%-% IV SOLN
INTRAVENOUS | Status: DC
  Administered 2017-04-07 – 2017-04-10 (×9): via INTRAVENOUS
  Filled 2017-04-07 (×10): qty 1000

## 2017-04-07 MED ORDER — LORAZEPAM 2 MG/ML IJ SOLN: 1.0000 mg | Freq: Four times a day (QID) | INTRAMUSCULAR | Status: AC | PRN

## 2017-04-07 MED ORDER — ACETAMINOPHEN 650 MG RE SUPP: 650.0000 mg | Freq: Four times a day (QID) | RECTAL | Status: DC | PRN

## 2017-04-07 MED ORDER — MAGNESIUM SULFATE 2 GM/50ML IV SOLN
2.0000 g | Freq: Once | INTRAVENOUS | Status: AC
  Administered 2017-04-07: 2 g via INTRAVENOUS
  Filled 2017-04-07: qty 50

## 2017-04-07 MED ORDER — POTASSIUM CHLORIDE IN NACL 20-0.9 MEQ/L-% IV SOLN
INTRAVENOUS | Status: AC
  Administered 2017-04-07 (×2): via INTRAVENOUS
  Filled 2017-04-07 (×2): qty 1000

## 2017-04-07 MED ORDER — SODIUM CHLORIDE 0.9 % IV SOLN
1.0000 g | Freq: Once | INTRAVENOUS | Status: AC
  Administered 2017-04-07: 1 g via INTRAVENOUS
  Filled 2017-04-07: qty 10

## 2017-04-07 MED ORDER — HYDROMORPHONE HCL 1 MG/ML IJ SOLN
0.5000 mg | INTRAMUSCULAR | Status: DC | PRN
  Administered 2017-04-07 – 2017-04-11 (×21): 1 mg via INTRAVENOUS
  Filled 2017-04-07 (×21): qty 1

## 2017-04-07 MED ORDER — LORAZEPAM 2 MG/ML IJ SOLN: 0.0000 mg | Freq: Four times a day (QID) | INTRAMUSCULAR | Status: AC

## 2017-04-07 MED ORDER — ONDANSETRON HCL 4 MG/2ML IJ SOLN: 4.0000 mg | Freq: Four times a day (QID) | INTRAMUSCULAR | Status: DC | PRN

## 2017-04-07 MED ORDER — ACETAMINOPHEN 325 MG PO TABS: 650.0000 mg | ORAL_TABLET | Freq: Four times a day (QID) | ORAL | Status: DC | PRN

## 2017-04-07 MED ORDER — LORAZEPAM 2 MG/ML IJ SOLN: 0.0000 mg | Freq: Two times a day (BID) | INTRAMUSCULAR | Status: AC

## 2017-04-07 MED ORDER — LORAZEPAM 1 MG PO TABS: 1.0000 mg | ORAL_TABLET | Freq: Four times a day (QID) | ORAL | Status: AC | PRN

## 2017-04-07 MED ORDER — FOLIC ACID 5 MG/ML IJ SOLN
1.0000 mg | Freq: Every day | INTRAMUSCULAR | Status: DC
  Administered 2017-04-08 – 2017-04-10 (×3): 1 mg via INTRAVENOUS
  Filled 2017-04-07 (×4): qty 0.2

## 2017-04-07 NOTE — ED Notes (Signed)
Family at bedside. 

## 2017-04-07 NOTE — ED Notes (Signed)
Pt back in room from CT scan.

## 2017-04-07 NOTE — ED Notes (Signed)
Patient CBG was 94, Nurse Santiago Glad was informed.

## 2017-04-07 NOTE — ED Notes (Signed)
Date and time results received: 04/07/17 3:56 AM  (use smartphrase ".now" to insert current time)  Test: Calcium  Critical Value: 6.2  Name of Provider Notified: Text page sent to Dr. Jeannett Senior primary RN notified  Orders Received? Or Actions Taken?: awaiting response

## 2017-04-07 NOTE — Progress Notes (Signed)
The patient was admitted early this AM after midnight and H and P has been reviewed and I am in current agreement with the H and P done by Dr. Christia Reading Opyd. Additional changes to the plan of care have been made accordingly. The patient is a 31 female with a PMH of Fatty Liver Disease, EtOH Pancreatitis, and other comorbids who presented to [facility] ED with severe Abdominal Pain, Nausea, and Vomiting after drinking EtOH this weekend. When examined she described left lower quadrant pain with rebound tenderness. CT Scan of the Abdomen showed severe acute pancreatitis without focal necrosis or immediate complication and no psuedocyst was seen. There was also moderate ascites withouth focal fluid collection and mild hepatomegaly and severe hepatic steatosis vs. Hepatitis. She states her Abdominal pain is improving but still there. Will continue IVF Rehydration and replete Electrolytes (Ca2+ and K+). Labs are improving and AST, ALT, Alk Phos, and T Bill trending down. Will check a Lipid Panel and repeat Lipase Level this AM. We will also continue with CIWA protocol and monitor for Withdrawals. Pain control will be with IV Hydromorphone as patient is NPO except Meds and Sips and will slowly advance diet. Will continue to monitor patient's clinical response to intervention and repeat blood work in AM.

## 2017-04-07 NOTE — H&P (Signed)
History and Physical    Augusta Mirkin LNL:[id number] DOB: 19-Oct-1970 DOA: 04/06/2017  PCP: [name], MD   Patient coming from: Home  Chief Complaint: Abdominal pain, N/V, s/p alcohol binge   HPI: Kelsey Swanson is a 47 y.o. female with medical history significant for history of alcoholic pancreatitis, now presenting to the emergency department for evaluation of severe abdominal nausea and nonbloody vomiting after a alcohol binge.  Patient reports that she was drinking very heavily over the weekend and since that time has had worsening pain in her upper abdomen with nausea and nonbloody vomiting.  Pain is severe, generalized to the upper quadrants, more so on the left, nonradiating, associated with loss of appetite.  She denies melena or hematochezia.  Denies chest pain.  Denies fevers, chills, shortness of breath, or cough.  Reports that she has promised her family she will stop drinking.  ED Course: Upon arrival to the ED, patient is found to be afebrile, saturating well on room air, tachycardic in the 120s, and with stable blood pressure.  Chemistry panel is notable for sodium of 129, alkaline phosphatase 327, AST 138, ALT 131, and total bilirubin of 4.6.  Lipase is elevated to 276.  CBC features a mild normocytic anemia and a new thrombocytopenia with platelets 85,000.  CT of the abdomen and pelvis is consistent with severe acute pancreatitis without focal necrosis or pseudocyst.  Also noted on the CT is moderate ascites with mild hepatomegaly and severe steatosis versus hepatitis.  Patient was treated with a liter of normal saline, Zofran, Percocet, and Dilaudid in the ED.  She remains tachycardic, in no apparent respiratory distress, and will be admitted to the telemetry unit for ongoing evaluation and management acute alcoholic pancreatitis with suspected acute alcoholic hepatitis.  Review of Systems:  All other systems reviewed and apart from HPI, are negative.  Past Medical  History:  Diagnosis Date  . Abnormal Pap smear 1996  . Fatty liver     Past Surgical History:  Procedure Laterality Date  . BREAST SURGERY     right breast  . ECTOPIC PREGNANCY SURGERY  2012     reports that she quit smoking about 22 months ago. Her smoking use included cigarettes. She has a 3.00 pack-year smoking history. she has never used smokeless tobacco. She reports that she drinks alcohol. She reports that she does not use drugs.  No Known Allergies  Family History  Problem Relation Age of Onset  . Diabetes Father   . Diabetes Maternal Aunt   . Diabetes Maternal Grandmother      Prior to Admission medications   Medication Sig Start Date End Date Taking? Authorizing Provider  Multiple Vitamins-Minerals (ALIVE ONCE DAILY WOMENS) TABS Take 1 tablet by mouth daily.   Yes [provider]    Physical Exam: Vitals:   04/06/17 2245 04/06/17 2300 04/06/17 2315 04/06/17 2345  BP: 102/66 102/65 104/67 102/60  Pulse: 89 95 91 93  Resp:      Temp:      TempSrc:      SpO2: 100% 100% 100% 100%  Weight:      Height:          Constitutional: NAD, calm, in obvious discomfort Eyes: PERTLA, lids and conjunctivae normal ENMT: Mucous membranes are moist. Posterior pharynx clear of any exudate or lesions.   Neck: normal, supple, no masses, no thyromegaly Respiratory: clear to auscultation bilaterally, no wheezing, no crackles. Normal respiratory effort.   Cardiovascular: Rate ~120 and regular.  No extremity edema.  Abdomen: Distended, soft, tender in upper quadrants without rebound pain or guarding. Bowel sounds active.  Musculoskeletal: no clubbing / cyanosis. No joint deformity upper and lower extremities. Normal muscle tone.  Skin: Jaundice. Warm, dry, well-perfused. Neurologic: CN 2-12 grossly intact. Sensation intact. Strength 5/5 in all 4 limbs.  Psychiatric:  Alert and oriented x 3. Pleasant and cooperative.     Labs on Admission: I have personally reviewed  following labs and imaging studies  CBC: Recent Labs  Lab 04/06/17 1852  WBC 8.5  HGB 11.6*  HCT 37.1  MCV 98.1  PLT 85*   Basic Metabolic Panel: Recent Labs  Lab 04/06/17 1852  NA 129*  K 3.5  CL 96*  CO2 20*  GLUCOSE 123*  BUN <5*  CREATININE 0.59  CALCIUM 6.5*   GFR: Estimated Creatinine Clearance: 88.8 mL/min (by C-G formula based on SCr of 0.59 mg/dL). Liver Function Tests: Recent Labs  Lab 04/06/17 1852  AST 138*  ALT 131*  ALKPHOS 327*  BILITOT 4.6*  PROT 6.7  ALBUMIN 3.3*   Recent Labs  Lab 04/06/17 1852  LIPASE 279*   No results for input(s): AMMONIA in the last 168 hours. Coagulation Profile: No results for input(s): INR, PROTIME in the last 168 hours. Cardiac Enzymes: No results for input(s): CKTOTAL, CKMB, CKMBINDEX, TROPONINI in the last 168 hours. BNP (last 3 results) No results for input(s): PROBNP in the last 8760 hours. HbA1C: No results for input(s): HGBA1C in the last 72 hours. CBG: No results for input(s): GLUCAP in the last 168 hours. Lipid Profile: No results for input(s): CHOL, HDL, LDLCALC, TRIG, CHOLHDL, LDLDIRECT in the last 72 hours. Thyroid Function Tests: No results for input(s): TSH, T4TOTAL, FREET4, T3FREE, THYROIDAB in the last 72 hours. Anemia Panel: No results for input(s): VITAMINB12, FOLATE, FERRITIN, TIBC, IRON, RETICCTPCT in the last 72 hours. Urine analysis:    Component Value Date/Time   COLORURINE AMBER (A) 04/06/2017 1915   APPEARANCEUR CLOUDY (A) 04/06/2017 1915   LABSPEC 1.025 04/06/2017 1915   PHURINE 5.0 04/06/2017 1915   GLUCOSEU 50 (A) 04/06/2017 1915   HGBUR NEGATIVE 04/06/2017 1915   BILIRUBINUR SMALL (A) 04/06/2017 1915   BILIRUBINUR neg 11/13/2015 1656   KETONESUR 5 (A) 04/06/2017 1915   PROTEINUR >=300 (A) 04/06/2017 1915   UROBILINOGEN negative 11/13/2015 1656   UROBILINOGEN 0.2 06/04/2009 1316   NITRITE NEGATIVE 04/06/2017 1915   LEUKOCYTESUR NEGATIVE 04/06/2017 1915   Sepsis  Labs: @LABRCNTIP (procalcitonin:4,lacticidven:4) )No results found for this or any previous visit (from the past 240 hour(s)).   Radiological Exams on Admission: Ct Abdomen Pelvis W Contrast  Result Date: 04/07/2017 CLINICAL DATA:  Generalized abdominal pain for 3 days; similar symptoms with prior pancreatitis. History of fatty liver. EXAM: CT ABDOMEN AND PELVIS WITH CONTRAST TECHNIQUE: Multidetector CT imaging of the abdomen and pelvis was performed using the standard protocol following bolus administration of intravenous contrast. CONTRAST:  113mL ISOVUE-300 IOPAMIDOL (ISOVUE-300) INJECTION 61% COMPARISON:  CT abdomen and pelvis June 15, 2016 and MRI of the abdomen June 16, 2016 FINDINGS: LOWER CHEST: Lung bases are clear. Included heart size is normal. No pericardial effusion. HEPATOBILIARY: The liver is mildly enlarged, diffusely hypodense similar to prior CT. Mild focal fatty sparing about the gallbladder fossa. Normal gallbladder. PANCREAS: Severe inflammatory changes about the pancreatic body and tail. No focal necrosis, pseudocyst, ductal dilatation, mass or calcifications. SPLEEN: Normal. ADRENALS/URINARY TRACT: Kidneys are orthotopic, demonstrating symmetric enhancement. No nephrolithiasis, hydronephrosis or solid renal masses. The  unopacified ureters are normal in course and caliber. Delayed imaging through the kidneys demonstrates symmetric prompt contrast excretion within the proximal urinary collecting system. Urinary bladder is well distended and unremarkable. Normal adrenal glands. STOMACH/BOWEL: The stomach, small and large bowel are normal in course and caliber without inflammatory changes. Normal appendix. VASCULAR/LYMPHATIC: Aortoiliac vessels are normal in course and caliber. Mild calcific atherosclerosis no lymphadenopathy by CT size criteria. REPRODUCTIVE: Normal. OTHER: Moderate retroperitoneal effusion tracking to LEFT abdomen and pelvis. No focal fluid collection. No intraperitoneal  free air. Phleboliths in the pelvis. Small fat containing umbilical hernia. MUSCULOSKELETAL: Nonacute. IMPRESSION: . 1. Severe acute pancreatitis without focal necrosis or immediate complication. No pseudocyst. 2. Moderate ascites without focal fluid collection. 3. Mild hepatomegaly and severe hepatic steatosis versus hepatitis. Aortic Atherosclerosis (ICD10-I70.0). Electronically Signed   By: Elon Alas M.D.   On: 04/07/2017 00:27    EKG: Not performed.    Assessment/Plan  1. Acute pancreatitis  - Presents with severe upper abdominal pain with N/V after an alcohol binge  - Found to have mild transaminase elevations, jaundice, elevated lipase, and CT-findings consistent with pancreatitis without necrosis or pseudocyst  - Treated in ED with 1 liter NS and analgesia  - Continue fluid-resuscitation with NS and supportive care with bowel-rest and prn analgesia, prn antiemetics   - Counseled toward alcohol avoidance; she agrees, wants to abstain, promised her family she'll stop drinking   2. Alcoholic hepatitis  - Presents with severe upper abd pain with distension, jaundice, and N/V after an alcohol binge  - She is found to have acute pancreatitis with mild transaminase elevations and t bili 4.6   - Gallstone pancreatitis still on differential, will check RUQ [place]  - Check INR, viral hepatitis panel  - Counseled toward alcohol avoidance; she agrees, wants to abstain, promised her family she'll stop drinking    3. Thrombocytopenia  - Platelets 85,000 on admission, previously wnl  - Likely secondary to alcohol abuse - No bleeding evident  - Repeat CBC in am    4. Hyponatremia  - Serum sodium is 129 on admission in setting of hypovolemia  - Treated in ED with 1 liter NS  - Continue fluid-resuscitation with NS, repeat chem panel in am     DVT prophylaxis: SCD's  Code Status: Full  Family Communication: Discussed with patient Disposition Plan: Admit to telemetry Consults called:  None Admission status: Inpatient   Vianne Bulls, MD [facility] Pager [phone]  If 7PM-7AM, please contact night-coverage [email or web address] Password [email or web address]  04/07/2017, 2:10 AM

## 2017-04-08 DIAGNOSIS — K760 Fatty (change of) liver, not elsewhere classified: Secondary | ICD-10-CM

## 2017-04-08 DIAGNOSIS — E669 Obesity, unspecified: Secondary | ICD-10-CM

## 2017-04-08 DIAGNOSIS — D649 Anemia, unspecified: Secondary | ICD-10-CM

## 2017-04-08 DIAGNOSIS — E876 Hypokalemia: Secondary | ICD-10-CM

## 2017-04-08 LAB — MAGNESIUM: Magnesium: 2.1 mg/dL (ref 1.7–2.4)

## 2017-04-08 LAB — CBC WITH DIFFERENTIAL/PLATELET
Basophils Absolute: 0 10*3/uL (ref 0.0–0.1)
Basophils Relative: 0 %
Eosinophils Absolute: 0.1 10*3/uL (ref 0.0–0.7)
Eosinophils Relative: 2 %
HCT: 32.1 % — ABNORMAL LOW (ref 36.0–46.0)
Hemoglobin: 9.8 g/dL — ABNORMAL LOW (ref 12.0–15.0)
Lymphocytes Relative: 28 %
Lymphs Abs: 1.9 10*3/uL (ref 0.7–4.0)
MCH: 30 pg (ref 26.0–34.0)
MCHC: 30.5 g/dL (ref 30.0–36.0)
MCV: 98.2 fL (ref 78.0–100.0)
Monocytes Absolute: 0.6 10*3/uL (ref 0.1–1.0)
Monocytes Relative: 8 %
Neutro Abs: 4.1 10*3/uL (ref 1.7–7.7)
Neutrophils Relative %: 62 %
Platelets: 118 10*3/uL — ABNORMAL LOW (ref 150–400)
RBC: 3.27 MIL/uL — ABNORMAL LOW (ref 3.87–5.11)
RDW: 15.6 % — ABNORMAL HIGH (ref 11.5–15.5)
WBC: 6.6 10*3/uL (ref 4.0–10.5)

## 2017-04-08 LAB — COMPREHENSIVE METABOLIC PANEL
ALT: 96 U/L — ABNORMAL HIGH (ref 14–54)
AST: 80 U/L — ABNORMAL HIGH (ref 15–41)
Albumin: 2.7 g/dL — ABNORMAL LOW (ref 3.5–5.0)
Alkaline Phosphatase: 287 U/L — ABNORMAL HIGH (ref 38–126)
Anion gap: 8 (ref 5–15)
BUN: <5 mg/dL — ABNORMAL LOW (ref 6–20)
CO2: 23 mmol/L (ref 22–32)
Calcium: 7.7 mg/dL — ABNORMAL LOW (ref 8.9–10.3)
Chloride: 102 mmol/L (ref 101–111)
Creatinine, Ser: 0.52 mg/dL (ref 0.44–1.00)
GFR calc Af Amer: >60 mL/min (ref >60)
GFR calc non Af Amer: >60 mL/min (ref >60)
Glucose, Bld: 114 mg/dL — ABNORMAL HIGH (ref 65–99)
Potassium: 4.5 mmol/L (ref 3.5–5.1)
Sodium: 133 mmol/L — ABNORMAL LOW (ref 135–145)
Total Bilirubin: 3.3 mg/dL — ABNORMAL HIGH (ref 0.3–1.2)
Total Protein: 6 g/dL — ABNORMAL LOW (ref 6.5–8.1)

## 2017-04-08 LAB — HEPATITIS PANEL, ACUTE
HCV Ab: <0.1 s/co ratio (ref 0.0–0.9)
Hep A IgM: NEGATIVE
Hep B C IgM: NEGATIVE
Hepatitis B Surface Ag: NEGATIVE

## 2017-04-08 LAB — PHOSPHORUS: Phosphorus: 1.8 mg/dL — ABNORMAL LOW (ref 2.5–4.6)

## 2017-04-08 LAB — LIPASE, BLOOD: Lipase: 153 U/L — ABNORMAL HIGH (ref 11–51)

## 2017-04-08 LAB — GLUCOSE, CAPILLARY: Glucose-Capillary: 119 mg/dL — ABNORMAL HIGH (ref 65–99)

## 2017-04-08 MED ORDER — POTASSIUM PHOSPHATES 15 MMOLE/5ML IV SOLN
20.0000 mmol | Freq: Once | INTRAVENOUS | Status: AC
  Administered 2017-04-08: 20 mmol via INTRAVENOUS
  Filled 2017-04-08: qty 6.67

## 2017-04-08 NOTE — Care Management Note (Signed)
Case Management Note  Patient Details  Name: Kelsey Swanson MRN: [id number] Date of Birth: 1970-06-13  Subjective/Objective:     Acute Pancreatitis              Action/Plan: Patient lives at home; pt states that she does not have a primary care physician; no medical insurance per Epic; Development worker, community has seen the pt to discuss what the patient might qualify for;  she is agreeable to follow up at the  [facility] and [facility] for ongoing medical care. She lives at home with her spouse and two daughters ( 71 yrs and 22 months); pharmacy of choice is [facility]; CM talked to patient about ETOH abuse, she stated that she knows that it is a problem and that she will stop. " I have stopped before and I can stop again." Emotional support given. CM will continue to follow for progression of care.  Expected Discharge Date:   possibly 04/12/2017               Expected Discharge Plan:   home with family  In-House Referral:    Financial Counselor  Status of Service:   In progress  Sherrilyn Rist [phone] 04/08/2017, 10:36 AM

## 2017-04-08 NOTE — Progress Notes (Signed)
PROGRESS NOTE    Kelsey Swanson  OIB:[id number] DOB: Jun 19, 1970 DOA: 04/06/2017 PCP: [name], MD   Brief Narrative:  The patient is a 89 female with a PMH of Fatty Liver Disease, EtOH Pancreatitis, and other comorbids who presented to [facility] ED with severe Abdominal Pain, Nausea, and Vomiting after drinking EtOH this weekend. When examined she described left lower quadrant pain with rebound tenderness. CT Scan of the Abdomen showed severe acute pancreatitis without focal necrosis or immediate complication and no psuedocyst was seen. There was also moderate ascites withouth focal fluid collection and mild hepatomegaly and severe hepatic steatosis vs. Hepatitis. She states her Abdominal pain is improving but still there. Will continue IVF Rehydration and replete Electrolytes as necessar. Labs are improving and AST, ALT, Alk Phos, and T Bill trending down but patient remains Nauseous and complains of Abdominal Pain. We will also continue with CIWA protocol and monitor for Withdrawals. Pain control will be with IV Hydromorphone as patient is NPO except Meds and Sips and will slowly advance diet (attempted today but made NPO again).   Assessment & Plan:   Principal Problem:   Acute pancreatitis without infection or necrosis Active Problems:   Hyponatremia   Elevated LFTs   Fatty liver   Obesity (BMI 30.0-34.9)   Acute alcoholic hepatitis   Thrombocytopenia (HCC)   Acute alcoholic pancreatitis   Jaundice   Normocytic anemia   Hyperbilirubinemia   Hypocalcemia   Hypokalemia  Acute Alcoholic Pancreatitis  -Presented with severe upper abdominal pain with N/V after an alcohol binge  -Found to have mild transaminase elevations, jaundice, elevated lipase, and CT-findings showed Severe acute pancreatitis without focal necrosis or immediate complication. No pseudocyst. Moderate ascites without focal fluid collection. Mild hepatomegaly and severe hepatic steatosis versus  hepatitis. -Lipase Level went from 279 -> 321 -> 153 -Treated in ED with 1 liter NS and analgesia  -C/w Bowel-rest and prn analgesia with Hydromorphone 0.5-1 mg IV q4hprn, prn antiemetics with Zofran 4 mg po/IV; Started Clear Liquid Diet but patient having too much abdominal pain and had some Nausea so made NPO Again -IV Rehydration with D5 1/2NS at 125 mL/hr -Counseled toward alcohol avoidance; she agrees, wants to abstain, promised her family she'll stop drinking -TG was 608 -If not improving or continues to worsen will discuss case with GI in AM   Alcoholic Hepatitis/Hepatic Steatosis -Presented with severe upper abd pain with distension, jaundice, and N/V after an alcohol binge  -AST was 138 and ALT was 131 on Admission; Now AST improved to 80 and ALT improved to 96 -She was found to have acute pancreatitis with mild transaminase elevations and T Bili was 4.6 -Gallstone pancreatitis was on differential but RUQ U/S Showed No acute RIGHT upper quadrant process.  No cholelithiasis.Coarsened hepatic echotexture seen with steatosis, hepatocellular disease/fibrosis. -Checked INR and was 1.05  -Acute Viral Hepatitis Panel was Negative -Counseled toward alcohol avoidance; she agrees, wants to abstain, promised her family she'll stop drinking    Thrombocytopenia, improving   -Platelets 85,000 on admission, previously wnl and now improved to 114,000 -Likely secondary to alcohol abuse -No bleeding evident; Continue to Monitor for S/Sx of Bleeding -Repeat CBC in am    Alcohol Abuse -Concern for Withdrawal -C/w CIWA Protocol with Lorazepam -C/w Folic Acid, Thiamine; Will ad MVIF when taking po  Hyponatremia, improving -Serum sodium is 129 on admission in setting of hypovolemia now was 133 -Treated in ED with 1 liter NS  -C/w IVF Rescusitation with D5W  1/2 NS at 125 mL/hr  Hypokalemia, improved -Patient's K+ was 3.4 and improved to 4.5 -Replete with IV KCl yesterday -Patient is on D5W  1/2NS + 20 mEQ of KCl at 125 mL/hr -Continue to Monitor and Replete as Necessary -Repeat CMP in AM  Hypophosphatemia -Patient's Phos Level was 1.8 -Replete with IV KPhos 20 mmol -Continue to Monitor and Replete as Necessary -Repeat Phos Level in AM  Hypocalcemia -Patient's Ca2+ was 6.2 yesterday and improved to 7.7 today -Replete with IV Calcium Gluconate x2 yesterday -Check Ionized Calcium -Continue to Monitor and Replete as Necessary -Repeat CMP in AM   Hyperbilirubinemia -As Above. T Bili went from 4.6 on Admission -> 3.3 -Continue to Monitor and Repeat CMP in AM  Normocytic Anemia -Patient's Hb/Hct went from 11.6/37.1 -> 10.4/33.1 -> 9.8/32.1 -Suspect Dilutional Drop from IVF Resuscitation -Continue to Monitor for S/Sx of Bleeding -Repeat CBC in AM   DVT prophylaxis: SCDs Code Status: FULL CODE Family Communication: No family present at bedside Disposition Plan: Remain Inpatient for continued Treatment  Consultants:   None  Procedures: None   Antimicrobials:  Anti-infectives (From admission, onward)   None     Subjective: Seen and examined at bedside and and was a little nauseous this AM. Still complaining of Abdominal pain and more on the left side of the Abdomen in the lower mid to lower quadrant. No CP or SOB. Denied any other concerns or complaints.   Objective: Vitals:   04/07/17 1800 04/07/17 2006 04/08/17 0451 04/08/17 1156  BP: (!) 127/91 120/78 (!) 105/58 111/69  Pulse: (!) 116 95 94 99  Resp: _0 Temp:  99.7 F (37.6 C) 99.8 F (37.7 C)   TempSrc:  Oral Oral   SpO2: 100% 100% 100%   Weight:  77.7 kg (171 lb 4.8 oz) 77.1 kg (169 lb 15.6 oz)   Height:  _1  (1.575 m)      Intake/Output Summary (Last 24 hours) at 04/08/2017 1200 Last data filed at 04/08/2017 0600 Gross per 24 hour  Intake 2270.83 ml  Output -  Net 2270.83 ml   Filed Weights   04/06/17 1846 04/07/17 2006 04/08/17 0451  Weight: 81.6 kg (180 lb) 77.7 kg (171 lb 4.8  oz) 77.1 kg (169 lb 15.6 oz)   Examination: Physical Exam:  Constitutional: WN/WD obese Female in NAD and appears calm but uncomfortable Eyes: Lids and conjunctivae normal, sclerae slightly icteric   ENMT: External Ears, Nose appear normal. Grossly normal hearing. Mucous membranes are moist.  Neck: Appears normal, supple, no cervical masses, normal ROM, no appreciable thyromegaly, no JVD Respiratory: Diminished to auscultation bilaterally, no wheezing, rales, rhonchi or crackles. Normal respiratory effort and patient is not tachypenic. No accessory muscle use.  Cardiovascular: Slightly tachycardic, no murmurs / rubs / gallops. S1 and S2 auscultated. No extremity edema.  Abdomen: Soft, Tender to palpate especially in mid and left side of abdomen, non-distended. No masses palpated. No appreciable hepatosplenomegaly. Bowel sounds positive x4.  GU: Deferred. Musculoskeletal: No clubbing / cyanosis of digits/nails. No joint deformity upper and lower extremities.   Skin: No rashes, lesions, ulcers. No induration; Warm and dry. Skin slightly jaundiced hue Neurologic: CN 2-12 grossly intact with no focal deficits. Romberg sign and cerebellar reflexes not assessed.  Psychiatric: Normal judgment and insight. Alert and oriented x 3. Normal mood and appropriate affect.   Data Reviewed: I have personally reviewed following labs and imaging studies  CBC: Recent Labs  Lab 04/06/17 1852 04/07/17  3818 04/08/17 0528  WBC 8.5 7.8 6.6  NEUTROABS  --  5.3 4.1  HGB 11.6* 10.4* 9.8*  HCT 37.1 33.1* 32.1*  MCV 98.1 97.1 98.2  PLT 85* 84* 299*   Basic Metabolic Panel: Recent Labs  Lab 04/06/17 1852 04/07/17 0253 04/07/17 2115 04/08/17 0528  NA 129* 134* 131* 133*  K 3.5 3.3* 3.8 4.5  CL 96* 102 102 102  CO2 20* 20* 19* 23  GLUCOSE 123* 95 98 114*  BUN <5* <5* <5* <5*  CREATININE 0.59 0.60 0.50 0.52  CALCIUM 6.5* 6.2* 7.3* 7.7*  MG  --   --   --  2.1  PHOS  --   --   --  1.8*    GFR: Estimated Creatinine Clearance: 86.3 mL/min (by C-G formula based on SCr of 0.52 mg/dL). Liver Function Tests: Recent Labs  Lab 04/06/17 1852 04/07/17 0253 04/07/17 2115 04/08/17 0528  AST 138* 96* 95* 80*  ALT 131* 109* 99* 96*  ALKPHOS 327* 276* 269* 287*  BILITOT 4.6* 3.7* 3.6* 3.3*  PROT 6.7 6.4* 6.1* 6.0*  ALBUMIN 3.3* 3.0* 2.7* 2.7*   Recent Labs  Lab 04/06/17 1852 04/07/17 0253 04/08/17 0528  LIPASE 279* 321* 153*   No results for input(s): AMMONIA in the last 168 hours. Coagulation Profile: Recent Labs  Lab 04/07/17 0134  INR 1.05   Cardiac Enzymes: No results for input(s): CKTOTAL, CKMB, CKMBINDEX, TROPONINI in the last 168 hours. BNP (last 3 results) No results for input(s): PROBNP in the last 8760 hours. HbA1C: No results for input(s): HGBA1C in the last 72 hours. CBG: Recent Labs  Lab 04/07/17 0938 04/07/17 1744 04/08/17 0759  GLUCAP 80 65 119*   Lipid Profile: Recent Labs    04/07/17 0253  TRIG 608*   Thyroid Function Tests: No results for input(s): TSH, T4TOTAL, FREET4, T3FREE, THYROIDAB in the last 72 hours. Anemia Panel: No results for input(s): VITAMINB12, FOLATE, FERRITIN, TIBC, IRON, RETICCTPCT in the last 72 hours. Sepsis Labs: No results for input(s): PROCALCITON, LATICACIDVEN in the last 168 hours.  No results found for this or any previous visit (from the past 240 hour(s)).   Radiology Studies: Ct Abdomen Pelvis W Contrast  Result Date: 04/07/2017 CLINICAL DATA:  Generalized abdominal pain for 3 days; similar symptoms with prior pancreatitis. History of fatty liver. EXAM: CT ABDOMEN AND PELVIS WITH CONTRAST TECHNIQUE: Multidetector CT imaging of the abdomen and pelvis was performed using the standard protocol following bolus administration of intravenous contrast. CONTRAST:  149m ISOVUE-300 IOPAMIDOL (ISOVUE-300) INJECTION 61% COMPARISON:  CT abdomen and pelvis June 15, 2016 and MRI of the abdomen June 16, 2016 FINDINGS:  LOWER CHEST: Lung bases are clear. Included heart size is normal. No pericardial effusion. HEPATOBILIARY: The liver is mildly enlarged, diffusely hypodense similar to prior CT. Mild focal fatty sparing about the gallbladder fossa. Normal gallbladder. PANCREAS: Severe inflammatory changes about the pancreatic body and tail. No focal necrosis, pseudocyst, ductal dilatation, mass or calcifications. SPLEEN: Normal. ADRENALS/URINARY TRACT: Kidneys are orthotopic, demonstrating symmetric enhancement. No nephrolithiasis, hydronephrosis or solid renal masses. The unopacified ureters are normal in course and caliber. Delayed imaging through the kidneys demonstrates symmetric prompt contrast excretion within the proximal urinary collecting system. Urinary bladder is well distended and unremarkable. Normal adrenal glands. STOMACH/BOWEL: The stomach, small and large bowel are normal in course and caliber without inflammatory changes. Normal appendix. VASCULAR/LYMPHATIC: Aortoiliac vessels are normal in course and caliber. Mild calcific atherosclerosis no lymphadenopathy by CT size criteria. REPRODUCTIVE: Normal.  OTHER: Moderate retroperitoneal effusion tracking to LEFT abdomen and pelvis. No focal fluid collection. No intraperitoneal free air. Phleboliths in the pelvis. Small fat containing umbilical hernia. MUSCULOSKELETAL: Nonacute. IMPRESSION: . 1. Severe acute pancreatitis without focal necrosis or immediate complication. No pseudocyst. 2. Moderate ascites without focal fluid collection. 3. Mild hepatomegaly and severe hepatic steatosis versus hepatitis. Aortic Atherosclerosis (ICD10-I70.0). Electronically Signed   By: Elon Alas M.D.   On: 04/07/2017 00:27   US Abdomen Limited Ruq  Result Date: 04/07/2017 CLINICAL DATA:  Acute pancreatitis. History of pancreatitis and hepatic steatosis. EXAM: ULTRASOUND ABDOMEN LIMITED RIGHT UPPER QUADRANT COMPARISON:  CT abdomen and pelvis April 07, 2017 FINDINGS:  Gallbladder: No gallstones or wall thickening visualized. No sonographic Murphy sign noted by sonographer. Common bile duct: Diameter: 4 mm Liver: Mild hepatomegaly, coarsened echo texture. Portal vein is patent on color Doppler imaging with normal direction of blood flow towards the liver. IMPRESSION: 1. No acute RIGHT upper quadrant process.  No cholelithiasis. 2. Coarsened hepatic echotexture seen with steatosis, hepatocellular disease/fibrosis. Electronically Signed   By: Elon Alas M.D.   On: 04/07/2017 02:54   Scheduled Meds: . folic acid  1 mg Intravenous Daily  . LORazepam  0-4 mg Intravenous Q6H   Followed by  . [START ON 04/09/2017] LORazepam  0-4 mg Intravenous Q12H  . sodium chloride flush  3 mL Intravenous Q12H  . thiamine  100 mg Oral Daily   Or  . thiamine  100 mg Intravenous Daily   Continuous Infusions: . dextrose 5 % and 0.45 % NaCl with KCl 20 mEq/L 125 mL/hr at 04/08/17 0353  . potassium PHOSPHATE IVPB (mmol)      LOS: 1 day   Kerney Elbe, DO [facility] Pager [phone]  If 7PM-7AM, please contact night-coverage [email or web address] Password [email or web address] 04/08/2017, 12:00 PM

## 2017-04-09 ENCOUNTER — Inpatient Hospital Stay (HOSPITAL_COMMUNITY): Payer: Medicaid Other

## 2017-04-09 DIAGNOSIS — R1012 Left upper quadrant pain: Secondary | ICD-10-CM

## 2017-04-09 LAB — CBC WITH DIFFERENTIAL/PLATELET
Basophils Absolute: 0 10*3/uL (ref 0.0–0.1)
Basophils Relative: 0 %
Eosinophils Absolute: 0.1 10*3/uL (ref 0.0–0.7)
Eosinophils Relative: 1 %
HCT: 31.3 % — ABNORMAL LOW (ref 36.0–46.0)
Hemoglobin: 9.7 g/dL — ABNORMAL LOW (ref 12.0–15.0)
Lymphocytes Relative: 34 %
Lymphs Abs: 2.7 10*3/uL (ref 0.7–4.0)
MCH: 30.6 pg (ref 26.0–34.0)
MCHC: 31 g/dL (ref 30.0–36.0)
MCV: 98.7 fL (ref 78.0–100.0)
Monocytes Absolute: 1.2 10*3/uL — ABNORMAL HIGH (ref 0.1–1.0)
Monocytes Relative: 15 %
Neutro Abs: 3.9 10*3/uL (ref 1.7–7.7)
Neutrophils Relative %: 50 %
Platelets: 165 10*3/uL (ref 150–400)
RBC: 3.17 MIL/uL — ABNORMAL LOW (ref 3.87–5.11)
RDW: 15.9 % — ABNORMAL HIGH (ref 11.5–15.5)
WBC: 7.9 10*3/uL (ref 4.0–10.5)

## 2017-04-09 LAB — COMPREHENSIVE METABOLIC PANEL
ALT: 75 U/L — ABNORMAL HIGH (ref 14–54)
AST: 40 U/L (ref 15–41)
Albumin: 2.6 g/dL — ABNORMAL LOW (ref 3.5–5.0)
Alkaline Phosphatase: 266 U/L — ABNORMAL HIGH (ref 38–126)
Anion gap: 9 (ref 5–15)
BUN: <5 mg/dL — ABNORMAL LOW (ref 6–20)
CO2: 23 mmol/L (ref 22–32)
Calcium: 8.2 mg/dL — ABNORMAL LOW (ref 8.9–10.3)
Chloride: 102 mmol/L (ref 101–111)
Creatinine, Ser: 0.57 mg/dL (ref 0.44–1.00)
GFR calc Af Amer: >60 mL/min (ref >60)
GFR calc non Af Amer: >60 mL/min (ref >60)
Glucose, Bld: 121 mg/dL — ABNORMAL HIGH (ref 65–99)
Potassium: 3.8 mmol/L (ref 3.5–5.1)
Sodium: 134 mmol/L — ABNORMAL LOW (ref 135–145)
Total Bilirubin: 3.2 mg/dL — ABNORMAL HIGH (ref 0.3–1.2)
Total Protein: 6.1 g/dL — ABNORMAL LOW (ref 6.5–8.1)

## 2017-04-09 LAB — MAGNESIUM: Magnesium: 2.1 mg/dL (ref 1.7–2.4)

## 2017-04-09 LAB — PHOSPHORUS: Phosphorus: 2.9 mg/dL (ref 2.5–4.6)

## 2017-04-09 LAB — GLUCOSE, CAPILLARY: Glucose-Capillary: 123 mg/dL — ABNORMAL HIGH (ref 65–99)

## 2017-04-09 LAB — LIPASE, BLOOD: Lipase: 128 U/L — ABNORMAL HIGH (ref 11–51)

## 2017-04-09 NOTE — Progress Notes (Addendum)
PROGRESS NOTE    Kelsey Swanson  PTW:[id number] DOB: Sep 09, 1970 DOA: 04/06/2017 PCP: [name], MD   Brief Narrative:  The patient is a 41 female with a PMH of Fatty Liver Disease, EtOH Pancreatitis, and other comorbids who presented to [facility] ED with severe Abdominal Pain, Nausea, and Vomiting after drinking EtOH last weekend. CT Scan of the Abdomen showed severe Acute Pancreatitis without focal necrosis or immediate complication and no psuedocyst was seen. There was also moderate ascites withouth focal fluid collection and mild hepatomegaly and severe hepatic steatosis vs. Hepatitis. U/S of RUQ showed no acute Right Upper Quadrant Process but did show Coarsened hepatic echotexture seen with steatosis, hepatocellular disease/fibrosis. Given IVF Rehydration and replete Electrolytes as necessary. Labs are improving and AST, ALT, Alk Phos, and T Bill trending down but patient remained Nauseous and complained of Left Sided Abdominal Pain yesterday. Continuing with CIWA protocol and monitoring for Withdrawals. Pain is improving with IV Hydromorphone and patient not nauseous today so Diet was advanced to Clear Liquid Diet and if tolerates well will continue to Advance as tolerated.   Assessment & Plan:   Principal Problem:   Acute pancreatitis without infection or necrosis Active Problems:   Hyponatremia   Elevated LFTs   Fatty liver   Obesity (BMI 30.0-34.9)   Acute alcoholic hepatitis   Thrombocytopenia (HCC)   Acute alcoholic pancreatitis   Jaundice   Normocytic anemia   Hyperbilirubinemia   Hypocalcemia   Hypokalemia  Acute Alcoholic Pancreatitis  -Presented with severe upper abdominal pain with N/V after an alcohol binge  -Found to have mild transaminase elevations, jaundice, elevated lipase, and CT-findings showed Severe acute pancreatitis without focal necrosis or immediate complication. No pseudocyst. Moderate ascites without focal fluid collection. Mild hepatomegaly  and severe hepatic steatosis versus hepatitis. -Will get a complete Abdomen U/S given the continued Left Sided Pain and to evaluate the extent of Ascites mentioned on CT Scan; Will likely require Diagnostic Paracentesis if significant as she does not have an Hx of it.  -Lipase Level went from 279 -> 321 -> 153 -> 128 -Treated in ED with 1 liter NS and analgesia  -C/w prn analgesia with Hydromorphone 0.5-1 mg IV q4hprn, prn antiemetics with Zofran 4 mg po/IV;  -Started Clear Liquid Diet this AM and patient not Nauseous but still complaining of some Abdominal Tenderness on Left Side.  -IV Rehydration with D5 1/2NS at 125 mL/hr -Counseled toward alcohol avoidance; she agrees, wants to abstain, promised her family she'll stop drinking -TG was 608 -No longer nauseous but still has some Left Sided Pain. If not improving or continues to worsen will discuss case with GI in AM   Alcoholic Hepatitis/Hepatic Steatosis -Presented with severe upper abd pain with distension, jaundice, and N/V after an alcohol binge  -AST was 138 and ALT was 131 on Admission; Now AST improved to 40 and ALT improved to 75 -She was found to have acute pancreatitis with mild transaminase elevations and T Bili was 4.6 -Gallstone pancreatitis was on differential but RUQ U/S Showed No acute RIGHT upper quadrant process.  No cholelithiasis.Coarsened hepatic echotexture seen with steatosis, hepatocellular disease/fibrosis. -Checked INR and was 1.05  -Acute Viral Hepatitis Panel was Negative -Counseled toward alcohol avoidance; she agrees, wants to abstain, promised her family she'll stop drinking    Thrombocytopenia, improving   -Platelets 85,000 on admission, previously wnl and now improved to 165,000 -Likely secondary to alcohol abuse -No bleeding evident; Continue to Monitor for S/Sx of Bleeding -Repeat  CBC in am    Alcohol Abuse -Concern for Withdrawal -C/w CIWA Protocol with Lorazepam -C/w Folic Acid, Thiamine; Will ad  MVIF when taking po  Hyponatremia, improving -Serum sodium is 129 on admission in setting of hypovolemia now was 134 -Treated in ED with 1 liter NS  -C/w IVF Rescusitation with D5W 1/2 NS at 125 mL/hr for now  Hypokalemia, improved -Patient's K+ was 3.4 and improved to 3.8 -Patient is on D5W 1/2NS + 20 mEQ of KCl at 125 mL/hr -Continue to Monitor and Replete as Necessary -Repeat CMP in AM  Hypophosphatemia -Patient's Phos Level was 1.8 and improved to 2.9 -Continue to Monitor and Replete as Necessary -Repeat Phos Level in AM  Hypocalcemia -Patient's Ca2+ was 6.2 yesterday and improved to 8.2 today -Check Ionized Calcium (pending)  -Continue to Monitor and Replete as Necessary -Repeat CMP in AM   Hyperbilirubinemia -As Above. T Bili went from 4.6 on Admission -> 3.2 -Continue to Monitor and Repeat CMP in AM  Normocytic Anemia -Patient's Hb/Hct went from 11.6/37.1 -> 10.4/33.1 -> 9.8/32.1 -> 9.7/31.3 -Suspect Dilutional Drop from IVF Resuscitation -Continue to Monitor for S/Sx of Bleeding -Repeat CBC in AM   DVT prophylaxis: SCDs Code Status: FULL CODE Family Communication: Discussed with family present at bedside  Disposition Plan: Remain Inpatient for continued Treatment  Consultants:   None  Procedures: None   Antimicrobials:  Anti-infectives (From admission, onward)   None     Subjective: Seen and examined at beside and was no longer nauseous today. Was feeling better but still complaining of Left sided pain and stated was painful to palpation. No CP or SOB.  Objective: Vitals:   04/08/17 1156 04/09/17 0004 04/09/17 0520 04/09/17 1221  BP: 111/69 104/72 97/63 99/64  Pulse: 99 (!) 102 94 100  Resp:  _0 Temp:  99.9 F (37.7 C) 97.9 F (36.6 C) 99.3 F (37.4 C)  TempSrc:  Oral Oral Oral  SpO2:   100% 100%  Weight:      Height:        Intake/Output Summary (Last 24 hours) at 04/09/2017 1228 Last data filed at 04/09/2017 0300 Gross per 24  hour  Intake 2826.09 ml  Output -  Net 2826.09 ml   Filed Weights   04/06/17 1846 04/07/17 2006 04/08/17 0451  Weight: 81.6 kg (180 lb) 77.7 kg (171 lb 4.8 oz) 77.1 kg (169 lb 15.6 oz)   Examination: Physical Exam:  Constitutional: WN/WD obese Female in NAD; Appears calm Eyes: Sclerae slightly icteric. Lids Normal ENMT: External Ears and Nose appear normal. Neck: Supple with no JVD;  Respiratory: Diminished but unlabored breathing. No appreciable wheezing/rales/rhonchi Cardiovascular: Slightly tachycardic rate, but regular rhythm. No LE edema Abdomen: Soft, Tender to palpate on Left side of Abdomen. Distended due to body habitus. Bowel sounds present x4 GU: Deferred Musculoskeletal: No contractures; No cyanosis Skin: Warm and dry; No appreciable rashes, lesions on a limited skin evaluation; Mildly jaundiced Neurologic: CN 2-12 grossly intact. No appreciable focal deficits Psychiatric: Normal mood and affect. Intact judgement and insight  Data Reviewed: I have personally reviewed following labs and imaging studies  CBC: Recent Labs  Lab 04/06/17 1852 04/07/17 0253 04/08/17 0528 04/09/17 0336  WBC 8.5 7.8 6.6 7.9  NEUTROABS  --  5.3 4.1 3.9  HGB 11.6* 10.4* 9.8* 9.7*  HCT 37.1 33.1* 32.1* 31.3*  MCV 98.1 97.1 98.2 98.7  PLT 85* 84* 118* 696   Basic Metabolic Panel: Recent Labs  Lab 04/06/17  0240 04/07/17 0253 04/07/17 2115 04/08/17 0528 04/09/17 0336  NA 129* 134* 131* 133* 134*  K 3.5 3.3* 3.8 4.5 3.8  CL 96* 102 102 102 102  CO2 20* 20* 19* 23 23  GLUCOSE 123* 95 98 114* 121*  BUN <5* <5* <5* <5* <5*  CREATININE 0.59 0.60 0.50 0.52 0.57  CALCIUM 6.5* 6.2* 7.3* 7.7* 8.2*  MG  --   --   --  2.1 2.1  PHOS  --   --   --  1.8* 2.9   GFR: Estimated Creatinine Clearance: 86.3 mL/min (by C-G formula based on SCr of 0.57 mg/dL). Liver Function Tests: Recent Labs  Lab 04/06/17 1852 04/07/17 0253 04/07/17 2115 04/08/17 0528 04/09/17 0336  AST 138* 96* 95*  80* 40  ALT 131* 109* 99* 96* 75*  ALKPHOS 327* 276* 269* 287* 266*  BILITOT 4.6* 3.7* 3.6* 3.3* 3.2*  PROT 6.7 6.4* 6.1* 6.0* 6.1*  ALBUMIN 3.3* 3.0* 2.7* 2.7* 2.6*   Recent Labs  Lab 04/06/17 1852 04/07/17 0253 04/08/17 0528 04/09/17 0336  LIPASE 279* 321* 153* 128*   No results for input(s): AMMONIA in the last 168 hours. Coagulation Profile: Recent Labs  Lab 04/07/17 0134  INR 1.05   Cardiac Enzymes: No results for input(s): CKTOTAL, CKMB, CKMBINDEX, TROPONINI in the last 168 hours. BNP (last 3 results) No results for input(s): PROBNP in the last 8760 hours. HbA1C: No results for input(s): HGBA1C in the last 72 hours. CBG: Recent Labs  Lab 04/07/17 0938 04/07/17 1744 04/08/17 0759 04/09/17 0806  GLUCAP 80 65 119* 123*   Lipid Profile: Recent Labs    04/07/17 0253  TRIG 608*   Thyroid Function Tests: No results for input(s): TSH, T4TOTAL, FREET4, T3FREE, THYROIDAB in the last 72 hours. Anemia Panel: No results for input(s): VITAMINB12, FOLATE, FERRITIN, TIBC, IRON, RETICCTPCT in the last 72 hours. Sepsis Labs: No results for input(s): PROCALCITON, LATICACIDVEN in the last 168 hours.  No results found for this or any previous visit (from the past 240 hour(s)).   Radiology Studies: No results found. Scheduled Meds: . folic acid  1 mg Intravenous Daily  . LORazepam  0-4 mg Intravenous Q12H  . sodium chloride flush  3 mL Intravenous Q12H  . thiamine  100 mg Oral Daily   Or  . thiamine  100 mg Intravenous Daily   Continuous Infusions: . dextrose 5 % and 0.45 % NaCl with KCl 20 mEq/L 125 mL/hr at 04/09/17 1157    LOS: 2 days   Kerney Elbe, DO [facility] Pager [phone]  If 7PM-7AM, please contact night-coverage [email or web address] Password [facility] 04/09/2017, 12:28 PM

## 2017-04-10 DIAGNOSIS — R1032 Left lower quadrant pain: Secondary | ICD-10-CM

## 2017-04-10 LAB — CBC WITH DIFFERENTIAL/PLATELET
Basophils Absolute: 0 10*3/uL (ref 0.0–0.1)
Basophils Relative: 0 %
Eosinophils Absolute: 0.1 10*3/uL (ref 0.0–0.7)
Eosinophils Relative: 1 %
HCT: 28.4 % — ABNORMAL LOW (ref 36.0–46.0)
Hemoglobin: 8.8 g/dL — ABNORMAL LOW (ref 12.0–15.0)
Lymphocytes Relative: 33 %
Lymphs Abs: 2.7 10*3/uL (ref 0.7–4.0)
MCH: 30.8 pg (ref 26.0–34.0)
MCHC: 31 g/dL (ref 30.0–36.0)
MCV: 99.3 fL (ref 78.0–100.0)
Monocytes Absolute: 1.4 10*3/uL — ABNORMAL HIGH (ref 0.1–1.0)
Monocytes Relative: 18 %
Neutro Abs: 3.9 10*3/uL (ref 1.7–7.7)
Neutrophils Relative %: 48 %
Platelets: 252 10*3/uL (ref 150–400)
RBC: 2.86 MIL/uL — ABNORMAL LOW (ref 3.87–5.11)
RDW: 16.2 % — ABNORMAL HIGH (ref 11.5–15.5)
WBC: 8.1 10*3/uL (ref 4.0–10.5)

## 2017-04-10 LAB — COMPREHENSIVE METABOLIC PANEL
ALT: 58 U/L — ABNORMAL HIGH (ref 14–54)
AST: 32 U/L (ref 15–41)
Albumin: 2.4 g/dL — ABNORMAL LOW (ref 3.5–5.0)
Alkaline Phosphatase: 225 U/L — ABNORMAL HIGH (ref 38–126)
Anion gap: 9 (ref 5–15)
BUN: <5 mg/dL — ABNORMAL LOW (ref 6–20)
CO2: 24 mmol/L (ref 22–32)
Calcium: 8.4 mg/dL — ABNORMAL LOW (ref 8.9–10.3)
Chloride: 102 mmol/L (ref 101–111)
Creatinine, Ser: 0.47 mg/dL (ref 0.44–1.00)
GFR calc Af Amer: >60 mL/min (ref >60)
GFR calc non Af Amer: >60 mL/min (ref >60)
Glucose, Bld: 125 mg/dL — ABNORMAL HIGH (ref 65–99)
Potassium: 4.2 mmol/L (ref 3.5–5.1)
Sodium: 135 mmol/L (ref 135–145)
Total Bilirubin: 2.7 mg/dL — ABNORMAL HIGH (ref 0.3–1.2)
Total Protein: 5.9 g/dL — ABNORMAL LOW (ref 6.5–8.1)

## 2017-04-10 LAB — GLUCOSE, CAPILLARY: Glucose-Capillary: 117 mg/dL — ABNORMAL HIGH (ref 65–99)

## 2017-04-10 LAB — PHOSPHORUS: Phosphorus: 3.4 mg/dL (ref 2.5–4.6)

## 2017-04-10 LAB — MAGNESIUM: Magnesium: 2 mg/dL (ref 1.7–2.4)

## 2017-04-10 LAB — LIPASE, BLOOD: Lipase: 103 U/L — ABNORMAL HIGH (ref 11–51)

## 2017-04-10 MED ORDER — FOLIC ACID 5 MG/ML IJ SOLN
1.0000 mg | Freq: Every day | INTRAMUSCULAR | Status: DC
  Filled 2017-04-10: qty 0.2

## 2017-04-10 MED ORDER — FOLIC ACID 1 MG PO TABS
1.0000 mg | ORAL_TABLET | Freq: Every day | ORAL | Status: DC
  Administered 2017-04-11: 1 mg via ORAL
  Filled 2017-04-10: qty 1

## 2017-04-10 NOTE — Progress Notes (Signed)
PROGRESS NOTE    Kelsey Swanson  QJF:[id number] DOB: 11-Dec-1970 DOA: 04/06/2017 PCP: [name], MD   Brief Narrative:  The patient is a 54 female with a PMH of Fatty Liver Disease, EtOH Pancreatitis, and other comorbids who presented to [facility] ED with severe Abdominal Pain, Nausea, and Vomiting after drinking EtOH last weekend. CT Scan of the Abdomen showed severe Acute Pancreatitis without focal necrosis or immediate complication and no psuedocyst was seen. There was also moderate ascites withouth focal fluid collection and mild hepatomegaly and severe hepatic steatosis vs. Hepatitis. U/S of RUQ showed no acute Right Upper Quadrant Process but did show Coarsened hepatic echotexture seen with steatosis, hepatocellular disease/fibrosis. Given IVF Rehydration and replete Electrolytes as necessary. Labs are improving and AST, ALT, Alk Phos, and T Bill trending down but patient remained Nauseous and complained of Left Sided Abdominal Pain yesterday. Continuing with CIWA protocol and monitoring for Withdrawals. Diet is being advanced and patient placed on Soft Diet Today. If able to tolerate Diet without complications will likely D/C Home.  Assessment & Plan:   Principal Problem:   Acute pancreatitis without infection or necrosis Active Problems:   Hyponatremia   Elevated LFTs   Fatty liver   Obesity (BMI 30.0-34.9)   Acute alcoholic hepatitis   Thrombocytopenia (HCC)   Acute alcoholic pancreatitis   Jaundice   Normocytic anemia   Hyperbilirubinemia   Hypocalcemia   Hypokalemia  Acute Alcoholic Pancreatitis  -Presented with severe upper abdominal pain with N/V after an alcohol binge  -Found to have mild transaminase elevations, jaundice, elevated lipase, and CT-findings showed Severe acute pancreatitis without focal necrosis or immediate complication. No pseudocyst. Moderate ascites without focal fluid collection. Mild hepatomegaly and severe hepatic steatosis versus  hepatitis. -Will get a complete Abdomen U/S given the continued Left Sided Pain and to evaluate the extent of Ascites mentioned on CT Scan; -Repeat U/S of Abdomen showed Hepatic steatosis. Small volume of ascites identified.Pancreatic edema compatible with acute pancreatitis. The Ascites was noted within the left pericolic gutter and posterior to the spleen. -Lipase Level went from 279 -> 321 -> 153 -> 128 -> 103 -Treated in ED with 1 liter NS and analgesia  -C/w prn analgesia with Hydromorphone 0.5-1 mg IV q4hprn, prn antiemetics with Zofran 4 mg po/IV;  -Advanced Diet from Clear to Soft Diet -Continues to have focal pain on Left side -IV Rehydration with D5 1/2NS at 4m/hr -Counseled toward alcohol avoidance; she agrees, wants to abstain, promised her family she'll stop drinking -TG was 608; Check Full Lipid Panel  -No longer nauseous but still has some Left Sided Pain. If not improving or continues to worsen will discuss case with GI in AM  -OOB to Chair, [place]   Alcoholic Hepatitis/Hepatic Steatosis -Presented with severe upper abd pain with distension, jaundice, and N/V after an alcohol binge  -AST was 138 and ALT was 131 on Admission; Now AST improved to 32 and ALT improved to 58 -She was found to have acute pancreatitis with mild transaminase elevations and T Bili was 4.6 -Gallstone pancreatitis was on differential but RUQ U/S Showed No acute RIGHT upper quadrant process.  No cholelithiasis.Coarsened hepatic echotexture seen with steatosis, hepatocellular disease/fibrosis. -Checked INR and was 1.05  -Acute Viral Hepatitis Panel was Negative -Counseled toward alcohol avoidance; she agrees, wants to abstain, promised her family she'll stop drinking -Recommended Low Fat Diet   Thrombocytopenia, improving   -Platelets 85,000 on admission, previously wnl and now improved  to 252,000 -Likely secondary to alcohol abuse -No bleeding evident; Continue to  Monitor for S/Sx of Bleeding -Repeat CBC in am    Alcohol Abuse -Concern for Withdrawal -C/w CIWA Protocol with Lorazepam -C/w Folic Acid, Thiamine; Will ad MVI when taking po  Hyponatremia, improving -Serum sodium is 129 on admission in setting of hypovolemia now was 135 -Treated in ED with 1 liter NS  -C/w IVF Rescusitation with D5W 1/2 NS at 75 mL/hr for now  Hypokalemia, improved -Patient's K+ was 3.4 and improved to 3.8 -Patient is on D5W 1/2NS + 20 mEQ of KCl at 75 mL/hr -Continue to Monitor and Replete as Necessary -Repeat CMP in AM  Hypophosphatemia -Patient's Phos Level was 1.8 and improved to 3.4 -Continue to Monitor and Replete as Necessary -Repeat Phos Level in AM  Hypocalcemia -Patient's Ca2+ was 6.2 and improved to 8.4 today -Check Ionized Calcium (pending)  -Continue to Monitor and Replete as Necessary -Repeat CMP in AM   Hyperbilirubinemia -As Above. T Bili went from 4.6 on Admission -> 2.7 -Continue to Monitor and Repeat CMP in AM  Normocytic Anemia -Patient's Hb/Hct went from 11.6/37.1 -> 10.4/33.1 -> 9.8/32.1 -> 9.7/31.3 -> 8.8/28.4 -Suspect Dilutional Drop from IVF Resuscitation -Continue to Monitor for S/Sx of Bleeding -Repeat CBC in AM   DVT prophylaxis: SCDs Code Status: FULL CODE Family Communication: Discussed with family present at bedside  Disposition Plan: [place] for continued Treatment and Anticipate D/C Home in 24-48 hours   Consultants:    None  Procedures: None   Antimicrobials:  Anti-infectives (From admission, onward)   None     Subjective: Seen and examined at beside and was feeling better and hungry. Still complaining of Left sided Pain and states it is only tender when palpated. No CP or SOB. No other concerns or complaints at this time   Objective: Vitals:   04/09/17 2036 04/10/17 0034 04/10/17 0623 04/10/17 1238  BP: (!) 95/59 (!) 91/52 122/74 (!) 100/55  Pulse: (!) 109 90 100 91  Resp: '18 17 18 16    '$ Temp: 99.3 F (37.4 C) 98.6 F (37 C) (!) 97.4 F (36.3 C) 98.8 F (37.1 C)  TempSrc: Oral Oral Oral Oral  SpO2: 98% 98% 100% 99%  Weight:   76.6 kg (168 lb 14 oz)   Height:       No intake or output data in the 24 hours ending 04/10/17 1512 Filed Weights   04/07/17 2006 04/08/17 0451 04/10/17 0623  Weight: 77.7 kg (171 lb 4.8 oz) 77.1 kg (169 lb 15.6 oz) 76.6 kg (168 lb 14 oz)   Examination: Physical Exam:  Constitutional: WN/WD obese Female in NAD appears calm. No longer having nausea.  Eyes: Has Scleral icterus noted. Lids normal ENMT: External Ears and nose appear normal. MMM Neck: Supple with no JVD Respiratory: Diminished to auscultation. No appreciable wheezing/rales/rhonchi. Unlabored breathing  Cardiovascular: RRR; S1. S2. No extremity Edema Abdomen: Soft, Tender to palpate on Left Side of abdomen and closer to the lower quadrant. Distended body habitus. Bowel sounds present x4 GU: Deferred Musculoskeletal: No contractures; No cyanosis Skin: Warm and dry. No appreciable rashes or lesions. Has a jaundiced hue Neurologic: CN 2-12 grossly intact. No appreciable focal deficits Psychiatric: Normal mood and affect. Intact judgement and insight  Data Reviewed: I have personally reviewed following labs and imaging studies  CBC: Recent Labs  Lab 04/06/17 1852 04/07/17 0253 04/08/17 0528 04/09/17 0336 04/10/17 0550  WBC 8.5 7.8 6.6 7.9 8.1  NEUTROABS  --  5.3 4.1 3.9 3.9  HGB 11.6* 10.4* 9.8* 9.7* 8.8*  HCT 37.1 33.1* 32.1* 31.3* 28.4*  MCV 98.1 97.1 98.2 98.7 99.3  PLT 85* 84* 118* 165 832   Basic Metabolic Panel: Recent Labs  Lab 04/07/17 0253 04/07/17 2115 04/08/17 0528 04/09/17 0336 04/10/17 0550  NA 134* 131* 133* 134* 135  K 3.3* 3.8 4.5 3.8 4.2  CL 102 102 102 102 102  CO2 20* 19* '23 23 24  '$ GLUCOSE 95 98 114* 121* 125*  BUN <5* <5* <5* <5* <5*  CREATININE 0.60 0.50 0.52 0.57 0.47  CALCIUM 6.2* 7.3* 7.7* 8.2* 8.4*  MG  --   --  2.1 2.1 2.0  PHOS   --   --  1.8* 2.9 3.4   GFR: Estimated Creatinine Clearance: 86 mL/min (by C-G formula based on SCr of 0.47 mg/dL). Liver Function Tests: Recent Labs  Lab 04/07/17 0253 04/07/17 2115 04/08/17 0528 04/09/17 0336 04/10/17 0550  AST 96* 95* 80* 40 32  ALT 109* 99* 96* 75* 58*  ALKPHOS 276* 269* 287* 266* 225*  BILITOT 3.7* 3.6* 3.3* 3.2* 2.7*  PROT 6.4* 6.1* 6.0* 6.1* 5.9*  ALBUMIN 3.0* 2.7* 2.7* 2.6* 2.4*   Recent Labs  Lab 04/06/17 1852 04/07/17 0253 04/08/17 0528 04/09/17 0336 04/10/17 0550  LIPASE 279* 321* 153* 128* 103*   No results for input(s): AMMONIA in the last 168 hours. Coagulation Profile: Recent Labs  Lab 04/07/17 0134  INR 1.05   Cardiac Enzymes: No results for input(s): CKTOTAL, CKMB, CKMBINDEX, TROPONINI in the last 168 hours. BNP (last 3 results) No results for input(s): PROBNP in the last 8760 hours. HbA1C: No results for input(s): HGBA1C in the last 72 hours. CBG: Recent Labs  Lab 04/07/17 0938 04/07/17 1744 04/08/17 0759 04/09/17 0806 04/10/17 0750  GLUCAP 80 65 119* 123* 117*   Lipid Profile: No results for input(s): CHOL, HDL, LDLCALC, TRIG, CHOLHDL, LDLDIRECT in the last 72 hours. Thyroid Function Tests: No results for input(s): TSH, T4TOTAL, FREET4, T3FREE, THYROIDAB in the last 72 hours. Anemia Panel: No results for input(s): VITAMINB12, FOLATE, FERRITIN, TIBC, IRON, RETICCTPCT in the last 72 hours. Sepsis Labs: No results for input(s): PROCALCITON, LATICACIDVEN in the last 168 hours.  No results found for this or any previous visit (from the past 240 hour(s)).   Radiology Studies: US Abdomen Complete  Result Date: 04/09/2017 CLINICAL DATA:  Abdominal pain. Elevated alkaline phosphatase. Elevated bilirubin. EXAM: ABDOMEN ULTRASOUND COMPLETE COMPARISON:  04/07/2017 FINDINGS: Gallbladder: Gallbladder distension. No stones, sludge, or wall thickening. Negative sonographic Murphy's sign. Common bile duct: Diameter: 3.5 mm Liver:  Diffuse hepatic steatosis. No focal abnormality. Portal vein is patent on color Doppler imaging with normal direction of blood flow towards the liver. IVC: No abnormality visualized. Pancreas: Heterogeneous and edematous compatible with acute pancreatitis. Spleen: Size and appearance within normal limits. Right Kidney: Length: 12.6 cm. Echogenicity within normal limits. No mass or hydronephrosis visualized. Left Kidney: Length: 12.5 cm. Echogenicity within normal limits. No mass or hydronephrosis visualized. Abdominal aorta: No aneurysm visualized. Other findings: Ascites noted within the left pericolic gutter and posterior to the spleen. IMPRESSION: 1. Hepatic steatosis. 2. Small volume of ascites identified. 3. Pancreatic edema compatible with acute pancreatitis. Electronically Signed   By: Kerby Moors M.D.   On: 04/09/2017 18:01   Scheduled Meds: . [START ON 11/11/1658] folic acid  1 mg Oral Daily   Or  . [START ON 6/00/4599] folic acid  1 mg Intravenous Daily  . LORazepam  0-4 mg Intravenous Q12H  . sodium chloride flush  3 mL Intravenous Q12H  . thiamine  100 mg Oral Daily   Or  . thiamine  100 mg Intravenous Daily   Continuous Infusions: . dextrose 5 % and 0.45 % NaCl with KCl 20 mEq/L 125 mL/hr at 04/10/17 1246    LOS: 3 days   Kerney Elbe, DO [facility] Pager [phone]  If 7PM-7AM, please contact night-coverage [email or web address] Password [email or web address] 04/10/2017, 3:12 PM

## 2017-04-11 ENCOUNTER — Encounter: Payer: Self-pay | Admitting: Gastroenterology

## 2017-04-11 LAB — CBC WITH DIFFERENTIAL/PLATELET
Basophils Absolute: 0.1 10*3/uL (ref 0.0–0.1)
Basophils Relative: 1 %
Eosinophils Absolute: 0.1 10*3/uL (ref 0.0–0.7)
Eosinophils Relative: 1 %
HCT: 25.9 % — ABNORMAL LOW (ref 36.0–46.0)
Hemoglobin: 8 g/dL — ABNORMAL LOW (ref 12.0–15.0)
Lymphocytes Relative: 34 %
Lymphs Abs: 3.3 10*3/uL (ref 0.7–4.0)
MCH: 30.3 pg (ref 26.0–34.0)
MCHC: 30.9 g/dL (ref 30.0–36.0)
MCV: 98.1 fL (ref 78.0–100.0)
Monocytes Absolute: 1.3 10*3/uL — ABNORMAL HIGH (ref 0.1–1.0)
Monocytes Relative: 14 %
Neutro Abs: 4.8 10*3/uL (ref 1.7–7.7)
Neutrophils Relative %: 50 %
Platelets: 311 10*3/uL (ref 150–400)
RBC: 2.64 MIL/uL — ABNORMAL LOW (ref 3.87–5.11)
RDW: 16.7 % — ABNORMAL HIGH (ref 11.5–15.5)
WBC: 9.6 10*3/uL (ref 4.0–10.5)

## 2017-04-11 LAB — COMPREHENSIVE METABOLIC PANEL
ALT: 55 U/L — ABNORMAL HIGH (ref 14–54)
AST: 34 U/L (ref 15–41)
Albumin: 2.6 g/dL — ABNORMAL LOW (ref 3.5–5.0)
Alkaline Phosphatase: 197 U/L — ABNORMAL HIGH (ref 38–126)
Anion gap: 10 (ref 5–15)
BUN: <5 mg/dL — ABNORMAL LOW (ref 6–20)
CO2: 24 mmol/L (ref 22–32)
Calcium: 8.6 mg/dL — ABNORMAL LOW (ref 8.9–10.3)
Chloride: 102 mmol/L (ref 101–111)
Creatinine, Ser: 0.47 mg/dL (ref 0.44–1.00)
GFR calc Af Amer: >60 mL/min (ref >60)
GFR calc non Af Amer: >60 mL/min (ref >60)
Glucose, Bld: 115 mg/dL — ABNORMAL HIGH (ref 65–99)
Potassium: 4.1 mmol/L (ref 3.5–5.1)
Sodium: 136 mmol/L (ref 135–145)
Total Bilirubin: 2 mg/dL — ABNORMAL HIGH (ref 0.3–1.2)
Total Protein: 6 g/dL — ABNORMAL LOW (ref 6.5–8.1)

## 2017-04-11 LAB — PHOSPHORUS: Phosphorus: 4.9 mg/dL — ABNORMAL HIGH (ref 2.5–4.6)

## 2017-04-11 LAB — LIPID PANEL
Cholesterol: 321 mg/dL — ABNORMAL HIGH (ref 0–200)
HDL: 22 mg/dL — ABNORMAL LOW (ref >40)
LDL Cholesterol: 267 mg/dL — ABNORMAL HIGH (ref 0–99)
Total CHOL/HDL Ratio: 14.6 RATIO
Triglycerides: 162 mg/dL — ABNORMAL HIGH (ref <150)
VLDL: 32 mg/dL (ref 0–40)

## 2017-04-11 LAB — LIPASE, BLOOD: Lipase: 99 U/L — ABNORMAL HIGH (ref 11–51)

## 2017-04-11 LAB — MAGNESIUM: Magnesium: 1.8 mg/dL (ref 1.7–2.4)

## 2017-04-11 LAB — GLUCOSE, CAPILLARY: Glucose-Capillary: 114 mg/dL — ABNORMAL HIGH (ref 65–99)

## 2017-04-11 MED ORDER — THIAMINE HCL 100 MG PO TABS: 100.0000 mg | ORAL_TABLET | Freq: Every day | ORAL | 0 refills | Status: DC

## 2017-04-11 MED ORDER — FOLIC ACID 1 MG PO TABS: 1.0000 mg | ORAL_TABLET | Freq: Every day | ORAL | 0 refills | Status: DC

## 2017-04-11 MED ORDER — TRAMADOL HCL 50 MG PO TABS
50.0000 mg | ORAL_TABLET | Freq: Four times a day (QID) | ORAL | 0 refills | Status: DC
Start: 2017-04-11 — End: 2017-04-20

## 2017-04-11 MED ORDER — TRAMADOL HCL 50 MG PO TABS
50.0000 mg | ORAL_TABLET | Freq: Four times a day (QID) | ORAL | Status: DC
  Administered 2017-04-11: 50 mg via ORAL
  Filled 2017-04-11: qty 1

## 2017-04-11 NOTE — Progress Notes (Signed)
OT Cancellation Note  Patient Details Name: Kelsey Swanson MRN: [id number] DOB: 07-04-1970   Cancelled Treatment:    Reason Eval/Treat Not Completed: OT screened, no needs identified, will sign off. Spoke with RN and PT, Pt moving well in room and hallway, no OT needs at this time. OT referral is appreciated, will sign off.  Lou Cal, OT Pager [phone] 04/11/2017   Raymondo Band 04/11/2017, 1:42 PM

## 2017-04-11 NOTE — Discharge Summary (Signed)
Physician Discharge Summary  Kelsey Swanson BWL:[id number] DOB: 07/28/1970 DOA: 04/06/2017  PCP: [name], MD  Admit date: 04/06/2017 Discharge date: 04/11/2017  Admitted From: Home Disposition:  Home  Recommendations for Outpatient Follow-up:  1. Follow up with PCP in 1-2 weeks; Follow up with the [place] 2. Please obtain CMP/CBC, Mag, Phos in one week 3. Repeat Lipid Panel in 6 weeks and will leave to PCP to initiate Cholesterol Medication after resolution of LFT's and Pancreatitis  4. Follow up with GI after D/C  5. Please follow up on the following pending results:  Home Health: No Equipment/Devices: None    Discharge Condition: Stable CODE STATUS: FULL CODE Diet recommendation: Low Fat Heart Healthy   Brief/Interim Summary: The patient is a 52 female with a PMH of Fatty Liver Disease, EtOH Pancreatitis, and other comorbids who presented to [facility] ED with severe Abdominal Pain, Nausea, and Vomiting after drinking EtOH last weekend. CT Scan of the Abdomen showed severe Acute Pancreatitis without focal necrosis or immediate complication and no psuedocyst was seen. There was also moderate ascites withouth focal fluid collection and mild hepatomegaly and severe hepatic steatosis vs. Hepatitis. U/S of RUQ showed no acute Right Upper Quadrant Process but did show Coarsened hepatic echotexture seen with steatosis, hepatocellular disease/fibrosis. Given IVF Rehydration and replete Electrolytes as necessary. Labs are improving and AST, ALT, Alk Phos, and T Bill trending down but patient remained Nauseous and complained of Left Sided Abdominal Pain yesterday. Continuing with CIWA protocol and monitoring for Withdrawals. Diet is being advanced and patient placed on Soft Diet Today. She improved and tolerated with no issues and minimal pain. Lipid panel was elevated but will not start Medication until LFT's and Acute Pancreatitis improved. She was deemed medically stable to  D/C.  Discharge Diagnoses:  Principal Problem:   Acute pancreatitis without infection or necrosis Active Problems:   Hyponatremia   Elevated LFTs   Fatty liver   Obesity (BMI 30.0-34.9)   Acute alcoholic hepatitis   Thrombocytopenia (HCC)   Acute alcoholic pancreatitis   Jaundice   Normocytic anemia   Hyperbilirubinemia   Hypocalcemia   Hypokalemia  Acute Alcoholic Pancreatitis, improved -Presented with severe upper abdominal pain with N/V after an alcohol binge -Found to have mild transaminase elevations, jaundice, elevated lipase, and CT-findings showed Severe acute pancreatitis without focal necrosis or immediate complication. No pseudocyst. Moderate ascites without focal fluid collection. Mild hepatomegaly and severe hepatic steatosis versus hepatitis. -Will get a complete Abdomen U/S given the continued Left Sided Pain and to evaluate the extent of Ascites mentioned on CT Scan; -Repeat U/S of Abdomen showed Hepatic steatosis. Small volume of ascites identified.Pancreatic edema compatible with acute pancreatitis. The Ascites was noted within the left pericolic gutter and posterior to the spleen. -Lipase Level went from 279 -> 321 -> 153 -> 128 -> 103 -> 33 -Treated in ED with 1 liter NS and analgesia -C/w prn analgesia with Hydromorphone 0.5-1 mg IV q4hprn, prn antiemetics with Zofran 4 mg po/IV;  -Advanced Diet from Clear to Soft Diet -Continues to have focal pain on Left side -IV Rehydration with D5 1/2NS at 47m/hr -Counseled toward alcohol avoidance; she agrees, wants to abstain, promised her family she'll stop drinking -TG was 608; Check Full Lipid Panel  -No longer nauseous but still has some Left Sided Pain. Follow up with Dr. DLoletha Carrowin [facility] as an outpatient -OOB to Chair, Ambulate in [facility] PT/OT Evaluation  -Improved. Follow up with PCP as an outpatient  Alcoholic  Hepatitis/Hepatic Steatosis -Presented with severe upper abd pain with distension,  jaundice, and N/V after an alcohol binge -AST was 138 and ALT was 131 on Admission; Now AST improved to 34 and ALT improved to 55 -She was found to have acute pancreatitis with mild transaminase elevations and T Bili was 4.6 -Gallstone pancreatitis was on differential but RUQ U/S Showed No acute RIGHT upper quadrant process. No cholelithiasis.Coarsened hepatic echotexture seen with steatosis, hepatocellular disease/fibrosis. -Checked INR and was 1.05  -Acute Viral Hepatitis Panelwas Negative -Counseled toward alcohol avoidance; she agrees, wants to abstain, promised her family she'll stop drinking -Recommended Low Fat Diet  -Follow up with Gastroenterology and PCP as an outpatient  Thrombocytopenia, improving  -Platelets 85,000 on admission, previously [place] now improved to 311,000 -Likely secondary to alcohol abuse -No bleeding evident; Continue to Monitor for S/Sx of Bleeding -Repeat CBC as an outpatient  Alcohol Abuse -Concern for Withdrawal -C/w CIWA Protocol with Lorazepam -C/w Folic Acid, Thiamine; Will ad MVI when taking po  Hyponatremia, improving -Serum sodium is 129 on admission in setting of hypovolemianow was 136 -Treated in ED with 1 liter NS -Given IVF Rescusitation with D5W 1/2 NS at 75 mL/hr for now  Hypokalemia, improved -Patient's K+ was 3.4 and improved to 4.1 -Patient is on D5W 1/2NS + 20 mEQ of KCl at 75 mL/hr -Continue to Monitor and Replete as Necessary -Repeat CMP as an outpatient  Hypophosphatemia -Patient's Phos Level was 1.8 and improved to 4.9 -Continue to Monitor and Replete as Necessary -Repeat Phos Level as an outpatient  Hypocalcemia -Patient's Ca2+ was 6.2 and improved to 8.6 today -Continue to Monitor and Replete as Necessary -Repeat CMP as an outpatient   Hyperbilirubinemia -As Above. T Bili went from 4.6 on Admission -> 2.0 -Continue to Monitor and Repeat CMP in AM  Normocytic Anemia -Patient's Hb/Hct went from  11.6/37.1 -> 10.4/33.1 -> 9.8/32.1 -> 9.7/31.3 -> 8.8/28.4 -> 8.0/25.9 -Suspect Dilutional Drop from IVF Resuscitation -Continue to Monitor for S/Sx of Bleeding -Repeat CBC as an outpatient  Hyperlipidemia -Patient's Lipid Panel showed Cholesterol of 321, HDL of 22, LDL of 267, TG of 162, VLDL 32 -Will hold on starting treatment in hospitalization until LFTs and Acute Pancreatitis has resolved -Follow up with PCP to start treatment and encouraged Low Fat Diet    Discharge Instructions  Allergies as of 04/11/2017   No Known Allergies     Medication List    TAKE these medications   ALIVE ONCE DAILY WOMENS Tabs Take 1 tablet by mouth daily.   folic acid 1 MG tablet Commonly known as:  FOLVITE Take 1 tablet (1 mg total) by mouth daily.   thiamine 100 MG tablet Take 1 tablet (100 mg total) by mouth daily.   traMADol 50 MG tablet Commonly known as:  ULTRAM Take 1 tablet (50 mg total) by mouth every 6 (six) hours.       No Known Allergies  Consultations:  None  Procedures/Studies: US Abdomen Complete  Result Date: 04/09/2017 CLINICAL DATA:  Abdominal pain. Elevated alkaline phosphatase. Elevated bilirubin. EXAM: ABDOMEN ULTRASOUND COMPLETE COMPARISON:  04/07/2017 FINDINGS: Gallbladder: Gallbladder distension. No stones, sludge, or wall thickening. Negative sonographic Murphy's sign. Common bile duct: Diameter: 3.5 mm Liver: Diffuse hepatic steatosis. No focal abnormality. Portal vein is patent on color Doppler imaging with normal direction of blood flow towards the liver. IVC: No abnormality visualized. Pancreas: Heterogeneous and edematous compatible with acute pancreatitis. Spleen: Size and appearance within normal limits. Right Kidney: Length: 12.6 cm.  Echogenicity within normal limits. No mass or hydronephrosis visualized. Left Kidney: Length: 12.5 cm. Echogenicity within normal limits. No mass or hydronephrosis visualized. Abdominal aorta: No aneurysm visualized. Other  findings: Ascites noted within the left pericolic gutter and posterior to the spleen. IMPRESSION: 1. Hepatic steatosis. 2. Small volume of ascites identified. 3. Pancreatic edema compatible with acute pancreatitis. Electronically Signed   By: Kerby Moors M.D.   On: 04/09/2017 18:01   Ct Abdomen Pelvis W Contrast  Result Date: 04/07/2017 CLINICAL DATA:  Generalized abdominal pain for 3 days; similar symptoms with prior pancreatitis. History of fatty liver. EXAM: CT ABDOMEN AND PELVIS WITH CONTRAST TECHNIQUE: Multidetector CT imaging of the abdomen and pelvis was performed using the standard protocol following bolus administration of intravenous contrast. CONTRAST:  124m ISOVUE-300 IOPAMIDOL (ISOVUE-300) INJECTION 61% COMPARISON:  CT abdomen and pelvis June 15, 2016 and MRI of the abdomen June 16, 2016 FINDINGS: LOWER CHEST: Lung bases are clear. Included heart size is normal. No pericardial effusion. HEPATOBILIARY: The liver is mildly enlarged, diffusely hypodense similar to prior CT. Mild focal fatty sparing about the gallbladder fossa. Normal gallbladder. PANCREAS: Severe inflammatory changes about the pancreatic body and tail. No focal necrosis, pseudocyst, ductal dilatation, mass or calcifications. SPLEEN: Normal. ADRENALS/URINARY TRACT: Kidneys are orthotopic, demonstrating symmetric enhancement. No nephrolithiasis, hydronephrosis or solid renal masses. The unopacified ureters are normal in course and caliber. Delayed imaging through the kidneys demonstrates symmetric prompt contrast excretion within the proximal urinary collecting system. Urinary bladder is well distended and unremarkable. Normal adrenal glands. STOMACH/BOWEL: The stomach, small and large bowel are normal in course and caliber without inflammatory changes. Normal appendix. VASCULAR/LYMPHATIC: Aortoiliac vessels are normal in course and caliber. Mild calcific atherosclerosis no lymphadenopathy by CT size criteria. REPRODUCTIVE: Normal.  OTHER: Moderate retroperitoneal effusion tracking to LEFT abdomen and pelvis. No focal fluid collection. No intraperitoneal free air. Phleboliths in the pelvis. Small fat containing umbilical hernia. MUSCULOSKELETAL: Nonacute. IMPRESSION: . 1. Severe acute pancreatitis without focal necrosis or immediate complication. No pseudocyst. 2. Moderate ascites without focal fluid collection. 3. Mild hepatomegaly and severe hepatic steatosis versus hepatitis. Aortic Atherosclerosis (ICD10-I70.0). Electronically Signed   By: CElon AlasM.D.   On: 04/07/2017 00:27   UKoreaAbdomen Limited Ruq  Result Date: 04/07/2017 CLINICAL DATA:  Acute pancreatitis. History of pancreatitis and hepatic steatosis. EXAM: ULTRASOUND ABDOMEN LIMITED RIGHT UPPER QUADRANT COMPARISON:  CT abdomen and pelvis April 07, 2017 FINDINGS: Gallbladder: No gallstones or wall thickening visualized. No sonographic Murphy sign noted by sonographer. Common bile duct: Diameter: 4 mm Liver: Mild hepatomegaly, coarsened echo texture. Portal vein is patent on color Doppler imaging with normal direction of blood flow towards the liver. IMPRESSION: 1. No acute RIGHT upper quadrant process.  No cholelithiasis. 2. Coarsened hepatic echotexture seen with steatosis, hepatocellular disease/fibrosis. Electronically Signed   By: CElon AlasM.D.   On: 04/07/2017 02:54    Subjective: Seen and examined and felt better. Still had some residual focal pain in left side but significantly improved. Ready to go home.  Discharge Exam: Vitals:   04/11/17 0627 04/11/17 1148  BP: (!) 96/54 99/64  Pulse: 83 100  Resp:  18  Temp: 98.6 F (37 C) 98.6 F (37 C)  SpO2: 100% 98%   Vitals:   04/10/17 1800 04/10/17 2325 04/11/17 0627 04/11/17 1148  BP: (!) 98/53 (!) 92/50 (!) 96/54 99/64  Pulse: 95 92 83 100  Resp: 20   18  Temp: 99 F (37.2 C) 99.3 F (37.4  C) 98.6 F (37 C) 98.6 F (37 C)  TempSrc: Oral Oral Oral Oral  SpO2: 100% 96% 100% 98%   Weight:   76.6 kg (168 lb 14 oz)   Height:       General: Pt is alert, awake, not in acute distress Cardiovascular: RRR, S1/S2 +, no rubs, no gallops Respiratory: Diminished bilaterally, no wheezing, no rhonchi Abdominal: Soft, Mildly tender on the Left side of Abdomen, ND, bowel sounds + Extremities: no edema, no cyanosis  The results of significant diagnostics from this hospitalization (including imaging, microbiology, ancillary and laboratory) are listed below for reference.    Microbiology: No results found for this or any previous visit (from the past 240 hour(s)).   Labs: BNP (last 3 results) No results for input(s): BNP in the last 8760 hours. Basic Metabolic Panel: Recent Labs  Lab 04/07/17 2115 04/08/17 0528 04/09/17 0336 04/10/17 0550 04/11/17 0351  NA 131* 133* 134* 135 136  K 3.8 4.5 3.8 4.2 4.1  CL 102 102 102 102 102  CO2 19* _0 GLUCOSE 98 114* 121* 125* 115*  BUN <5* <5* <5* <5* <5*  CREATININE 0.50 0.52 0.57 0.47 0.47  CALCIUM 7.3* 7.7* 8.2* 8.4* 8.6*  MG  --  2.1 2.1 2.0 1.8  PHOS  --  1.8* 2.9 3.4 4.9*   Liver Function Tests: Recent Labs  Lab 04/07/17 2115 04/08/17 0528 04/09/17 0336 04/10/17 0550 04/11/17 0351  AST 95* 80* 40 32 34  ALT 99* 96* 75* 58* 55*  ALKPHOS 269* 287* 266* 225* 197*  BILITOT 3.6* 3.3* 3.2* 2.7* 2.0*  PROT 6.1* 6.0* 6.1* 5.9* 6.0*  ALBUMIN 2.7* 2.7* 2.6* 2.4* 2.6*   Recent Labs  Lab 04/07/17 0253 04/08/17 0528 04/09/17 0336 04/10/17 0550 04/11/17 0351  LIPASE 321* 153* 128* 103* 99*   No results for input(s): AMMONIA in the last 168 hours. CBC: Recent Labs  Lab 04/07/17 0253 04/08/17 0528 04/09/17 0336 04/10/17 0550 04/11/17 0351  WBC 7.8 6.6 7.9 8.1 9.6  NEUTROABS 5.3 4.1 3.9 3.9 4.8  HGB 10.4* 9.8* 9.7* 8.8* 8.0*  HCT 33.1* 32.1* 31.3* 28.4* 25.9*  MCV 97.1 98.2 98.7 99.3 98.1  PLT 84* 118* 165 252 311   Cardiac Enzymes: No results for input(s): CKTOTAL, CKMB, CKMBINDEX, TROPONINI in  the last 168 hours. BNP: Invalid input(s): POCBNP CBG: Recent Labs  Lab 04/07/17 1744 04/08/17 0759 04/09/17 0806 04/10/17 0750 04/11/17 0745  GLUCAP 65 119* 123* 117* 114*   D-Dimer No results for input(s): DDIMER in the last 72 hours. Hgb A1c No results for input(s): HGBA1C in the last 72 hours. Lipid Profile Recent Labs    04/11/17 0351  CHOL 321*  HDL 22*  LDLCALC 267*  TRIG 162*  CHOLHDL 14.6   Thyroid function studies No results for input(s): TSH, T4TOTAL, T3FREE, THYROIDAB in the last 72 hours.  Invalid input(s): FREET3 Anemia work up No results for input(s): VITAMINB12, FOLATE, FERRITIN, TIBC, IRON, RETICCTPCT in the last 72 hours. Urinalysis    Component Value Date/Time   COLORURINE AMBER (A) 04/06/2017 1915   APPEARANCEUR CLOUDY (A) 04/06/2017 1915   LABSPEC 1.025 04/06/2017 1915   PHURINE 5.0 04/06/2017 1915   GLUCOSEU 50 (A) 04/06/2017 1915   HGBUR NEGATIVE 04/06/2017 1915   BILIRUBINUR SMALL (A) 04/06/2017 1915   BILIRUBINUR neg 11/13/2015 1656   KETONESUR 5 (A) 04/06/2017 1915   PROTEINUR >=300 (A) 04/06/2017 1915   UROBILINOGEN negative 11/13/2015 1656   UROBILINOGEN 0.2 06/04/2009  [place] 04/06/2017 [place] 04/06/2017 1915   Sepsis Labs Invalid input(s): PROCALCITONIN,  WBC,  LACTICIDVEN Microbiology No results found for this or any previous visit (from the past 240 hour(s)).  Time coordinating discharge: 35 minutes  SIGNED:  Kerney Elbe, DO [facility] 04/11/2017, 12:50 PM Pager [phone]  If 7PM-7AM, please contact night-coverage [email or web address] Password [email or web address]

## 2017-04-11 NOTE — Evaluation (Signed)
Physical Therapy Evaluation Patient Details Name: Kelsey Swanson MRN: [id number] DOB: 1970-11-30 Today's Date: 04/11/2017   History of Present Illness  The patient is a 47 female with a PMH of Fatty Liver Disease, EtOH Pancreatitis, and other comorbids who presented to [facility] ED with severe Abdominal Pain, Nausea, and Vomiting after drinking EtOH last weekend. CT Scan of the Abdomen showed severe Acute Pancreatitis   Clinical Impression  Patient evaluated by Physical Therapy with no further acute PT needs identified. All education has been completed and the patient has no further questions. Pt independent with activity with no increase in abdominal pain. Ascended a flight of stairs with HR 125 bpm and O2 sats 97%. Instructed to ambulate frequently in hallway until d/c.  See below for any follow-up Physical Therapy or equipment needs. PT is signing off. Thank you for this referral.     Follow Up Recommendations No PT follow up    Equipment Recommendations  None recommended by PT    Recommendations for Other Services       Precautions / Restrictions Precautions Precautions: None Restrictions Weight Bearing Restrictions: No      Mobility  Bed Mobility Overal bed mobility: Independent                Transfers Overall transfer level: Independent Equipment used: None                Ambulation/Gait Ambulation/Gait assistance: Independent Ambulation Distance (Feet): 400 Feet Assistive device: None Gait Pattern/deviations: WFL(Within Functional Limits) Gait velocity: WFL Gait velocity interpretation: at or above normal speed for age/gender General Gait Details: pt ambulated with some abdominal doscomfort but no increase from lying in bed. No gait abnormalities noted  Stairs Stairs: Yes Stairs assistance: Modified independent (Device/Increase time) Stair Management: One rail Right;Alternating pattern;Forwards Number of Stairs: 10 General stair comments: VSS  on stairs. HR up to 125 bpm, O2 sats 97%  Wheelchair Mobility    Modified Rankin (Stroke Patients Only)       Balance Overall balance assessment: Independent                                           Pertinent Vitals/Pain Pain Assessment: Faces Faces Pain Scale: Hurts little more Pain Location: abdomen Pain Descriptors / Indicators: Aching Pain Intervention(s): Limited activity within patient's tolerance;Monitored during session    [place] expects to be discharged to:: Private residence Living Arrangements: Spouse/significant other;Children;Parent Available Help at Discharge: Family;Available 24 hours/day Type of Home: House Home Access: Level entry     Home Layout: Two level Home Equipment: None Additional Comments: pt has 14 mo. Mother is here from [place] and was supposed to go back but has stayed to help out while daughter is sick. Pt also has 71 yo at home    Prior Function Level of Independence: Independent         Comments: does not work outside the home     Journalist, newspaper        Extremity/Trunk Assessment   Upper Extremity Assessment Upper Extremity Assessment: Overall WFL for tasks assessed    Lower Extremity Assessment Lower Extremity Assessment: Overall WFL for tasks assessed    Cervical / Trunk Assessment Cervical / Trunk Assessment: Normal  Communication   Communication: Prefers language other than English(Spanish)  Cognition Arousal/Alertness: Awake/alert Behavior During Therapy: WFL for tasks assessed/performed Overall Cognitive Status: Within  Functional Limits for tasks assessed                                        General Comments General comments (skin integrity, edema, etc.): discussed healthy lifestyle and taking care of self including exercise and nutrition    Exercises     Assessment/Plan    PT Assessment Patent does not need any further PT services  PT Problem List          PT Treatment Interventions      PT Goals (Current goals can be found in the Care Plan section)  Acute Rehab PT Goals Patient Stated Goal: return home PT Goal Formulation: All assessment and education complete, DC therapy    Frequency     Barriers to discharge        Co-evaluation               AM-PAC PT "6 Clicks" Daily Activity  Outcome Measure Difficulty turning over in bed (including adjusting bedclothes, sheets and blankets)?: None Difficulty moving from lying on back to sitting on the side of the bed? : None Difficulty sitting down on and standing up from a chair with arms (e.g., wheelchair, bedside commode, etc,.)?: None Help needed moving to and from a bed to chair (including a wheelchair)?: None Help needed walking in hospital room?: None Help needed climbing 3-5 steps with a railing? : None 6 Click Score: 24    End of Session   Activity Tolerance: Patient tolerated treatment well Patient left: in bed;with call bell/phone within reach Nurse Communication: Mobility status PT Visit Diagnosis: Muscle weakness (generalized) (M62.81)    Time: 1610-9604 PT Time Calculation (min) (ACUTE ONLY): 17 min   Charges:   PT Evaluation $PT Eval Low Complexity: 1 Low     PT G Codes:        Leighton Roach, PT  Acute Rehab Services  [place] 04/11/2017, 12:35 PM

## 2017-04-11 NOTE — Progress Notes (Signed)
Pt discharged to home. PIV removed, AVS reviewed. Pt to follow up with new PCP and GI appointments outpatient. Pt left unit via wheelchair, belongings including prescription in hand. Pt to be transported home by spouse.

## 2017-04-11 NOTE — Progress Notes (Signed)
Follow up apt made with the [facility] for follow up medical care for Feb 27,2019 at 1:30 pm; CM talked to patient about her follow up apt and she will get her medication filled at [facility]; she stated that she does not have any problem getting her medication filled. Financial Counselor screened the patient also to see what she might qualify for. Mindi Slicker [facility] [phone]

## 2017-04-11 NOTE — Discharge Instructions (Signed)
Pancreatitis aguda (Acute Pancreatitis) La pancreatitis aguda es una afeccin en la cual el pncreas de repente se irrita e hincha (tiene inflamacin). El pncreas es una glndula que se encuentra detrs del [place]. Produce enzimas que ayudan a Animal nutritionist. El pncreas tambin libera las hormonas glucagn e insulina que regulan el nivel de azcar en la [place]. El dao al pncreas se produce cuando las enzimas digestivas del pncreas se activan antes de liberarse en el intestino. La mayor parte de los ataques duran un par de das y pueden ocasionar problemas graves. Algunas personas se deshidratan y les baja la presin arterial. En los casos graves, una hemorragia en el pncreas puede causar shock y ser potencialmente mortal. Los pulmones, el corazn y los riones Marine scientist. CAUSAS Las causas ms frecuentes para esta afeccin son las siguientes:  El consumo excesivo de alcohol.  Clculos biliares. Algunas otras causas son las siguientes:  Ciertos medicamentos.  La exposicin a ciertas sustancias qumicas.  Infeccin.  Dao producido por un accidente (traumatismo).  Una ciruga abdominal. En algunos casos, es posible que la causa no se conozca. SNTOMAS Los sntomas de esta afeccin incluyen lo siguiente:  Dolor en la zona superior del abdomen que puede irradiarse a la espalda.  Dolor a la palpacin e hinchazn en el abdomen.  Nuseas y vmitos. DIAGNSTICO Esta afeccin se puede diagnosticar en funcin de lo siguiente:  Un examen fsico.  Anlisis de sangre.  Estudio de diagnstico por imgenes, como radiografas, tomografas computarizadas, o una ecografa de abdomen. TRATAMIENTO El tratamiento generalmente requiere que permanezca en el hospital. El tratamiento puede incluir lo siguiente:  Analgsicos.  Reemplazo de lquidos a travs de una va intravenosa (IV).  Colocar una va en el estmago para retirar el contenido del estmago y Chief Technology Officer los vmitos  (sonda NG, o sonda nasogstrica).  No comer durante 3 o 4 das. Esto permite que el pncreas descanse y no produzca enzimas que podran causar ms dao.  Antibiticos, si la causa de la enfermedad es una infeccin.  Ciruga del pncreas o de la vescula biliar. INSTRUCCIONES PARA EL CUIDADO EN EL HOGAR Comida y bebida  Siga las indicaciones del mdico acerca de la dieta. Puede consistir en evitar el alcohol y disminuir la cantidad de grasas que consume en la dieta.  Consuma porciones pequeas y con frecuencia. Esto reduce la cantidad de lquidos digestivos que produce el pncreas.  Beba suficiente lquido para Consulting civil engineer orina clara o de color amarillo plido.  No consuma alcohol si esta fue la causa de su enfermedad. Instrucciones generales  [employer] de venta libre y los recetados solamente como se lo haya indicado el mdico.  No consuma ningn producto que contenga tabaco, lo que incluye cigarrillos, tabaco de Higher education careers adviser y Psychologist, sport and exercise. Si necesita ayuda para dejar de fumar, consulte al mdico.  Descanse lo suficiente.  Si se lo indican, controle su nivel de azcar en la sangre en su hogar como se lo haya indicado el mdico.  Concurra a todas las visitas de control como se lo haya indicado el mdico. Esto es importante. SOLICITE ATENCIN MDICA SI:  No mejora en el tiempo en que se esperaba.  Tiene sntomas nuevos o graves.  Tiene dolor persistente, nuseas o debilidad.  Se recupera y luego vuelve a Education officer, environmental.  Tiene fiebre. SOLICITE ATENCIN MDICA DE INMEDIATO SI:  No puede comer ni retener lquidos.  Tiene dolor intenso.  Nota que la piel o la zona blanca del ojo estn amarillas (  ictericia).  Vomita.  Se siente mareado o se desmaya.  Su nivel de azcar en la sangre es alto (ms de 300 mg/dl). Esta informacin no tiene Marine scientist el consejo del mdico. Asegrese de hacerle al mdico cualquier pregunta que tenga. Document  Released: 11/18/2004 Document Revised: 06/02/2015 Document Reviewed: 11/12/2014 Elsevier Interactive Patient Education  2018 [employer].

## 2017-04-20 ENCOUNTER — Ambulatory Visit: Payer: Medicaid Other | Attending: Internal Medicine | Admitting: Physician Assistant

## 2017-04-20 VITALS — BP 107/70 | HR 81 | Temp 97.6°F | Ht 62.0 in | Wt 167.6 lb

## 2017-04-20 DIAGNOSIS — Z79899 Other long term (current) drug therapy: Secondary | ICD-10-CM | POA: Insufficient documentation

## 2017-04-20 DIAGNOSIS — F1010 Alcohol abuse, uncomplicated: Secondary | ICD-10-CM | POA: Diagnosis not present

## 2017-04-20 DIAGNOSIS — E785 Hyperlipidemia, unspecified: Secondary | ICD-10-CM | POA: Diagnosis not present

## 2017-04-20 DIAGNOSIS — K852 Alcohol induced acute pancreatitis without necrosis or infection: Secondary | ICD-10-CM | POA: Diagnosis not present

## 2017-04-20 DIAGNOSIS — K860 Alcohol-induced chronic pancreatitis: Secondary | ICD-10-CM | POA: Diagnosis present

## 2017-04-20 MED ORDER — TRAMADOL HCL 50 MG PO TABS: 50.0000 mg | ORAL_TABLET | Freq: Four times a day (QID) | ORAL | 0 refills | Status: DC

## 2017-04-20 NOTE — Progress Notes (Signed)
Chief Complaint: ED follow up  Subjective: This is a 47 year old female followed by Dr. Wynetta Emery. She has a history of alcoholic pancreatitis, fatty liver, ectopic pregnancy and dyslipidemia. She was most recently hospitalized 04/06/2017 through 04/11/2017 with abdominal pain, nausea, vomiting after recent alcohol abuse. She was jaundiced on presentation. CT of the abdomen and pelvis showed severe acute pancreatitis with moderate ascites and severe hepatic steatosis. She was resuscitated with IV fluids, electrolytes replaced, CIWA protocol undertaken and when necessary pain medications. Her lipase levels went from 279-33 at discharge. Triglycerides were 608. AST went from 1 3830 for discharge. ALTs went from 131-55 at discharge. Platelets went from 85,311 at discharge. Sodium went from 129-136 at discharge. Phosphorus 1.8-4.9 at discharge. LDL was 267. HDL 22.  Overall she is feeling much better. She still on a full liquid or soft diet. Some abdominal pain persists. No nausea or vomiting. Ambulate without difficulty. No fevers. Taking her vitamins. Has abstained from alcohol since the incident. Wondering if she can have additional medication for pain.   ROS:  GEN: denies fever or chills, denies change in weight Skin: denies lesions or rashes HEENT: denies headache, earache, epistaxis, sore throat, or neck pain LUNGS: denies SHOB, dyspnea, PND, orthopnea CV: denies CP or palpitations ABD: + abd pain, no N or V EXT: denies muscle spasms or swelling; no pain in lower ext, no weakness NEURO: denies numbness or tingling, denies sz, stroke or TIA   Objective:  Vitals:   04/20/17 1338  BP: 107/70  Pulse: 81  Temp: 97.6 F (36.4 C)  TempSrc: Oral  SpO2: 100%  Weight: 167 lb 9.6 oz (76 kg)  Height: 5\' 2"  (1.575 m)    Physical Exam:  General: in no acute distress. HEENT: no pallor, no icterus, moist oral mucosa, no JVD, no lymphadenopathy Heart: Normal  s1 &s2  Regular rate and rhythm,  without murmurs, rubs, gallops. Lungs: Clear to auscultation bilaterally. Abdomen: soft but diffusely mildly tender Extremities: No clubbing cyanosis or edema with positive pedal pulses. Neuro: Alert, awake, oriented x3, nonfocal.   Medications: Prior to Admission medications   Medication Sig Start Date End Date Taking? Authorizing Provider  folic acid (FOLVITE) 1 MG tablet Take 1 tablet (1 mg total) by mouth daily. 04/11/17  Yes Sheikh, Omair Latif, DO  Multiple Vitamins-Minerals (ALIVE ONCE DAILY WOMENS) TABS Take 1 tablet by mouth daily.   Yes [provider]  thiamine 100 MG tablet Take 1 tablet (100 mg total) by mouth daily. Patient not taking: Reported on 04/20/2017 04/11/17   Kelsey Noble Latif, DO  traMADol (ULTRAM) 50 MG tablet Take 1 tablet (50 mg total) by mouth every 6 (six) hours. 04/20/17   Brayton Caves, PA-C    Assessment: 1. Acute on Chronic Alcoholic Pancreatitis  -check labs today; CBC, CMP, liver, electrolytes  -congratulated her on abstinence  -avoid triggers  -advance diet as tolerated  -GI appt 05/17/17>Dr. Loletha Carrow 2. Dyslipidemia  -not fasting today  -recheck lipids in 6 weeks; consider statin once LFTs  normalized 3. Alcohol Abuse  -congratulated her on abstinence   Follow up:4 weeks with Dr. Wynetta Emery  The patient was given clear instructions to go to ER or return to medical center if symptoms don't improve, worsen or new problems develop. The patient verbalized understanding. The patient was told to call to get lab results if they haven't heard anything in the next week.   This note has been created with Surveyor, quantity.  Any transcriptional errors are unintentional.   Kelsey Pho, PA-C 04/20/2017, 1:58 PM

## 2017-04-20 NOTE — Patient Instructions (Signed)
Alcohol Abuse and Nutrition Alcohol abuse is any pattern of alcohol consumption that harms your health, relationships, or work. Alcohol abuse can affect how your body breaks down and absorbs nutrients from food by causing your liver to work abnormally. Additionally, many people who abuse alcohol do not eat enough carbohydrates, protein, fat, vitamins, and minerals. This can cause poor nutrition (malnutrition) and a lack of nutrients (nutrient deficiencies), which can lead to further complications. Nutrients that are commonly lacking (deficient) among people who abuse alcohol include:  Vitamins. ? Vitamin A. This is stored in your liver. It is important for your vision, metabolism, and ability to fight off infections (immunity). ? B vitamins. These include vitamins such as folate, thiamin, and niacin. These are important in new cell growth and maintenance. ? Vitamin C. This plays an important role in iron absorption, wound healing, and immunity. ? Vitamin D. This is produced by your liver, but you can also get vitamin D from food. Vitamin D is necessary for your body to absorb and use calcium.  Minerals. ? Calcium. This is important for your bones and your heart and blood vessel (cardiovascular) function. ? Iron. This is important for blood, muscle, and nervous system functioning. ? Magnesium. This plays an important role in muscle and nerve function, and it helps to control blood sugar and blood pressure. ? Zinc. This is important for the normal function of your nervous system and digestive system (gastrointestinal tract).  Nutrition is an essential component of therapy for alcohol abuse. Your health care provider or dietitian will work with you to design a plan that can help restore nutrients to your body and prevent potential complications. What is my plan? Your dietitian may develop a specific diet plan that is based on your condition and any other complications you may have. A diet plan will  commonly include:  A balanced diet. ? Grains: 6-8 oz per day. ? Vegetables: 2-3 cups per day. ? Fruits: 1-2 cups per day. ? Meat and other protein: 5-6 oz per day. ? Dairy: 2-3 cups per day.  Vitamin and mineral supplements.  What do I need to know about alcohol and nutrition?  Consume foods that are high in antioxidants, such as grapes, berries, nuts, green tea, and dark green and orange vegetables. This can help to counteract some of the stress that is placed on your liver by consuming alcohol.  Avoid food and drinks that are high in fat and sugar. Foods such as sugared soft drinks, salty snack foods, and candy contain empty calories. This means that they lack important nutrients such as protein, fiber, and vitamins.  Eat frequent meals and snacks. Try to eat 5-6 small meals each day.  Eat a variety of fresh fruits and vegetables each day. This will help you get plenty of water, fiber, and vitamins in your diet.  Drink plenty of water and other clear fluids. Try to drink at least 48-64 oz (1.5-2 L) of water per day.  If you are a vegetarian, eat a variety of protein-rich foods. Pair whole grains with plant-based proteins at meals and snacks to obtain the greatest nutrient benefit from your food. For example, eat rice with beans, put peanut butter on whole-grain toast, or eat oatmeal with sunflower seeds.  Soak beans and whole grains overnight before cooking. This can help your body to absorb the nutrients more easily.  Include foods fortified with vitamins and minerals in your diet. Commonly fortified foods include milk, orange juice, cereal, and bread.  If you are malnourished, your dietitian may recommend a high-protein, high-calorie diet. This may include: ? 2,000-3,000 calories (kilocalories) per day. ? 70-100 grams of protein per day.  Your health care provider may recommend a complete nutritional supplement beverage. This can help to restore calories, protein, and vitamins to  your body. Depending on your condition, you may be advised to consume this instead of or in addition to meals.  Limit your intake of caffeine. Replace drinks like coffee and black tea with decaffeinated coffee and herbal tea.  Eat a variety of foods that are high in omega fatty acids. These include fish, nuts and seeds, and soybeans. These foods may help your liver to recover and may also stabilize your mood.  Certain medicines may cause changes in your appetite, taste, and weight. Work with your health care provider and dietitian to make any adjustments to your medicines and diet plan.  Include other healthy lifestyle choices in your daily routine. ? Be physically active. ? Get enough sleep. ? Spend time doing activities that you enjoy.  If you are unable to take in enough food and calories by mouth, your health care provider may recommend a feeding tube. This is a tube that passes through your nose and throat, directly into your stomach. Nutritional supplement beverages can be given to you through the feeding tube to help you get the nutrients you need.  Take vitamin or mineral supplements as recommended by your health care provider. What foods can I eat? Grains Enriched pasta. Enriched rice. Fortified whole-grain bread. Fortified whole-grain cereal. Barley. Brown rice. Quinoa. [place]. Vegetables All fresh, frozen, and canned vegetables. Spinach. Kale. Artichoke. Carrots. Winter squash and pumpkin. Sweet potatoes. Broccoli. Cabbage. Cucumbers. Tomatoes. Sweet peppers. Green beans. Peas. Corn. Fruits All fresh and frozen fruits. Berries. Grapes. Mango. Papaya. Guava. Cherries. Apples. Bananas. Peaches. Plums. Pineapple. Watermelon. Cantaloupe. Oranges. Avocado. Meats and Other Protein Sources Beef liver. Lean beef. Pork. Fresh and canned chicken. Fresh fish. Oysters. Sardines. Canned tuna. Shrimp. Eggs with yolks. Nuts and seeds. Peanut butter. Beans and lentils. Soybeans.  Tofu. Dairy Whole, low-fat, and nonfat milk. Whole, low-fat, and nonfat yogurt. Cottage cheese. Sour cream. Hard and soft cheeses. Beverages Water. Herbal tea. Decaffeinated coffee. Decaffeinated green tea. 100% fruit juice. 100% vegetable juice. Instant breakfast shakes. Condiments Ketchup. Mayonnaise. Mustard. Salad dressing. Barbecue sauce. Sweets and Desserts Sugar-free ice cream. Sugar-free pudding. Sugar-free gelatin. Fats and Oils Butter. Vegetable oil, flaxseed oil, olive oil, and walnut oil. Other Complete nutrition shakes. Protein bars. Sugar-free gum. The items listed above may not be a complete list of recommended foods or beverages. Contact your dietitian for more options. What foods are not recommended? Grains Sugar-sweetened breakfast cereals. Flavored instant oatmeal. Fried breads. Vegetables Breaded or deep-fried vegetables. Fruits Dried fruit with added sugar. Candied fruit. Canned fruit in syrup. Meats and Other Protein Sources Breaded or deep-fried meats. Dairy Flavored milks. Fried cheese curds or fried cheese sticks. Beverages Alcohol. Sugar-sweetened soft drinks. Sugar-sweetened tea. Caffeinated coffee and tea. Condiments Sugar. Honey. Agave nectar. Molasses. Sweets and Desserts Chocolate. Cake. Cookies. Candy. Other Potato chips. Pretzels. Salted nuts. Candied nuts. The items listed above may not be a complete list of foods and beverages to avoid. Contact your dietitian for more information. This information is not intended to replace advice given to you by your health care provider. Make sure you discuss any questions you have with your health care provider. Document Released: 12/03/2004 Document Revised: 06/18/2015 Document Reviewed: 09/11/2013 Elsevier Interactive Patient Education  2018 [employer]vier Inc[employer]  

## 2017-04-21 LAB — CBC WITH DIFFERENTIAL/PLATELET
Basophils Absolute: 0 10*3/uL (ref 0.0–0.2)
Basos: 1 %
EOS (ABSOLUTE): 0.1 10*3/uL (ref 0.0–0.4)
Eos: 2 %
Hematocrit: 25.5 % — ABNORMAL LOW (ref 34.0–46.6)
Hemoglobin: 8.2 g/dL — ABNORMAL LOW (ref 11.1–15.9)
Immature Grans (Abs): 0 10*3/uL (ref 0.0–0.1)
Immature Granulocytes: 0 %
Lymphocytes Absolute: 2.9 10*3/uL (ref 0.7–3.1)
Lymphs: 42 %
MCH: 32.8 pg (ref 26.6–33.0)
MCHC: 32.2 g/dL (ref 31.5–35.7)
MCV: 102 fL — ABNORMAL HIGH (ref 79–97)
Monocytes Absolute: 0.4 10*3/uL (ref 0.1–0.9)
Monocytes: 5 %
Neutrophils Absolute: 3.4 10*3/uL (ref 1.4–7.0)
Neutrophils: 50 %
Platelets: 786 10*3/uL — ABNORMAL HIGH (ref 150–379)
RBC: 2.5 x10E6/uL — CL (ref 3.77–5.28)
RDW: 18.8 % — ABNORMAL HIGH (ref 12.3–15.4)
WBC: 6.8 10*3/uL (ref 3.4–10.8)

## 2017-04-21 LAB — CMP AND LIVER
ALT: 66 IU/L — ABNORMAL HIGH (ref 0–32)
AST: 34 IU/L (ref 0–40)
Albumin: 4.5 g/dL (ref 3.5–5.5)
Alkaline Phosphatase: 104 IU/L (ref 39–117)
BUN: 10 mg/dL (ref 6–24)
Bilirubin Total: 0.9 mg/dL (ref 0.0–1.2)
Bilirubin, Direct: 0.46 mg/dL — ABNORMAL HIGH (ref 0.00–0.40)
CO2: 22 mmol/L (ref 20–29)
Calcium: 9.6 mg/dL (ref 8.7–10.2)
Chloride: 102 mmol/L (ref 96–106)
Creatinine, Ser: 0.51 mg/dL — ABNORMAL LOW (ref 0.57–1.00)
GFR calc Af Amer: 135 mL/min/{1.73_m2} (ref >59)
GFR calc non Af Amer: 117 mL/min/{1.73_m2} (ref >59)
Glucose: 81 mg/dL (ref 65–99)
Potassium: 5 mmol/L (ref 3.5–5.2)
Sodium: 140 mmol/L (ref 134–144)
Total Protein: 7.4 g/dL (ref 6.0–8.5)

## 2017-04-21 LAB — MAGNESIUM: Magnesium: 2 mg/dL (ref 1.6–2.3)

## 2017-04-21 LAB — PHOSPHORUS: Phosphorus: 5.1 mg/dL — ABNORMAL HIGH (ref 2.5–4.5)

## 2017-04-28 ENCOUNTER — Telehealth: Payer: Self-pay | Admitting: *Deleted

## 2017-04-28 NOTE — Telephone Encounter (Signed)
Left message to call back with Spanish Angela Burke- [phone]  regarding results: Notes recorded by Brayton Caves, PA-C on 04/27/2017 at 8:13 AM EST Please check on her. Bring her back this week for repeat CBC (please place order) with results to Dr. Wynetta Emery. Please cont to encourage sobriety. Please remind her of her upcoming GI appt. Thanks.

## 2017-05-02 ENCOUNTER — Ambulatory Visit: Payer: Self-pay | Attending: Internal Medicine

## 2017-05-02 ENCOUNTER — Ambulatory Visit: Payer: Self-pay

## 2017-05-02 DIAGNOSIS — K852 Alcohol induced acute pancreatitis without necrosis or infection: Secondary | ICD-10-CM

## 2017-05-02 DIAGNOSIS — D539 Nutritional anemia, unspecified: Secondary | ICD-10-CM | POA: Insufficient documentation

## 2017-05-02 NOTE — Progress Notes (Signed)
Patient here for lab visit only 

## 2017-05-03 ENCOUNTER — Ambulatory Visit: Payer: Self-pay | Attending: Internal Medicine

## 2017-05-03 ENCOUNTER — Other Ambulatory Visit: Payer: Self-pay | Admitting: Internal Medicine

## 2017-05-03 DIAGNOSIS — D539 Nutritional anemia, unspecified: Secondary | ICD-10-CM

## 2017-05-03 LAB — CBC WITH DIFFERENTIAL/PLATELET
Basophils Absolute: 0 10*3/uL (ref 0.0–0.2)
Basos: 0 %
EOS (ABSOLUTE): 0.2 10*3/uL (ref 0.0–0.4)
Eos: 4 %
Hematocrit: 31.9 % — ABNORMAL LOW (ref 34.0–46.6)
Hemoglobin: 10.3 g/dL — ABNORMAL LOW (ref 11.1–15.9)
Immature Grans (Abs): 0 10*3/uL (ref 0.0–0.1)
Immature Granulocytes: 0 %
Lymphocytes Absolute: 2.1 10*3/uL (ref 0.7–3.1)
Lymphs: 43 %
MCH: 32.7 pg (ref 26.6–33.0)
MCHC: 32.3 g/dL (ref 31.5–35.7)
MCV: 101 fL — ABNORMAL HIGH (ref 79–97)
Monocytes Absolute: 0.3 10*3/uL (ref 0.1–0.9)
Monocytes: 7 %
Neutrophils Absolute: 2.3 10*3/uL (ref 1.4–7.0)
Neutrophils: 46 %
Platelets: 248 10*3/uL (ref 150–379)
RBC: 3.15 x10E6/uL — ABNORMAL LOW (ref 3.77–5.28)
RDW: 14.8 % (ref 12.3–15.4)
WBC: 5 10*3/uL (ref 3.4–10.8)

## 2017-05-03 NOTE — Telephone Encounter (Signed)
Pt came in on 05/02/17 for lab test: repeat CBC.

## 2017-05-04 LAB — VITAMIN B12: Vitamin B-12: 802 pg/mL (ref 232–1245)

## 2017-05-04 LAB — FOLATE: Folate: >20 ng/mL (ref >3.0)

## 2017-05-04 NOTE — Progress Notes (Signed)
Patient here for lab visit only 

## 2017-05-17 ENCOUNTER — Encounter: Payer: Self-pay | Admitting: Gastroenterology

## 2017-05-17 ENCOUNTER — Ambulatory Visit (INDEPENDENT_AMBULATORY_CARE_PROVIDER_SITE_OTHER): Payer: Self-pay | Admitting: Gastroenterology

## 2017-05-17 VITALS — BP 102/58 | HR 80 | Ht 63.0 in | Wt 164.0 lb

## 2017-05-17 DIAGNOSIS — K8521 Alcohol induced acute pancreatitis with uninfected necrosis: Secondary | ICD-10-CM

## 2017-05-17 NOTE — Progress Notes (Signed)
[facility] Consult Note:  History: Kelsey Swanson 05/17/2017  Referring physician: [name], MD  Reason for consult/chief complaint: Pancreatitis (pt reports feeling well, no longer drinking alcohol) and Colonoscopy (pts reports she had a colon but doesn't know when or where)   Subjective  HPI:  This is a very pleasant 47 year old woman referred by the health and wellness clinic after a recent hospitalization for alcohol related pancreatitis.  She was admitted for 5 days in mid February after a weekend of heavy alcohol use.  She responded to conservative management, and reports complete alcohol abstinence since then.  She was admitted briefly in April 2018 for the same reason, and had a long period of abstinence afterwards until she started drinking again around January. At this point, she denies abdominal pain, nausea, vomiting, early satiety, weight loss, altered bowel habits or rectal bleeding.  ROS:  Review of Systems  Constitutional: Negative for appetite change and unexpected weight change.  HENT: Negative for mouth sores and voice change.   Eyes: Negative for pain and redness.  Respiratory: Negative for cough and shortness of breath.   Cardiovascular: Negative for chest pain and palpitations.  Genitourinary: Negative for dysuria and hematuria.  Musculoskeletal: Negative for arthralgias and myalgias.  Skin: Negative for pallor and rash.  Neurological: Negative for weakness and headaches.  Hematological: Negative for adenopathy.     Past Medical History: Past Medical History:  Diagnosis Date  . Abnormal Pap smear 1996  . Fatty liver   . Pancreatitis, chronic ([place])      Past Surgical History: Past Surgical History:  Procedure Laterality Date  . BREAST SURGERY     right breast  . ECTOPIC PREGNANCY SURGERY  2012     Family History: Family History  Problem Relation Age of Onset  . Diabetes Father   . Diabetes Maternal Aunt     . Ovarian cancer Maternal Aunt   . Diabetes Maternal Grandmother     Social History: Social History   Socioeconomic History  . Marital status: Married    Spouse name: Not on file  . Number of children: Not on file  . Years of education: Not on file  . Highest education level: Not on file  Occupational History  . Not on file  Social Needs  . Financial resource strain: Not on file  . Food insecurity:    Worry: Not on file    Inability: Not on file  . Transportation needs:    Medical: Not on file    Non-medical: Not on file  Tobacco Use  . Smoking status: Former Smoker    Packs/day: 0.30    Years: 10.00    Pack years: 3.00    Types: Cigarettes    Last attempt to quit: 06/07/2015    Years since quitting: 1.9  . Smokeless tobacco: Never Used  Substance and Sexual Activity  . Alcohol use: Not Currently  . Drug use: No  . Sexual activity: Yes    Partners: Male    Birth control/protection: None    Comment: early preg  Lifestyle  . Physical activity:    Days per week: Not on file    Minutes per session: Not on file  . Stress: Not on file  Relationships  . Social connections:    Talks on phone: Not on file    Gets together: Not on file    Attends religious service: Not on file    Active member of club or organization: Not on  file    Attends meetings of clubs or organizations: Not on file    Relationship status: Not on file  Other Topics Concern  . Not on file  Social History Narrative  . Not on file   She lives at home with her husband and children, and cares for the children full-time.  Allergies: No Known Allergies  Outpatient Meds: Current Outpatient Medications  Medication Sig Dispense Refill  . Ascorbic Acid (VITAMIN C) 1000 MG tablet Take 1,000 mg by mouth daily.    Marland Kitchen thiamine 100 MG tablet Take 1 tablet (100 mg total) by mouth daily. 30 tablet 0  . folic acid (FOLVITE) 1 MG tablet Take 1 tablet (1 mg total) by mouth daily. 30 tablet 0  . Multiple  Vitamins-Minerals (ALIVE ONCE DAILY WOMENS) TABS Take 1 tablet by mouth daily.    . traMADol (ULTRAM) 50 MG tablet Take 1 tablet (50 mg total) by mouth every 6 (six) hours. 30 tablet 0   No current facility-administered medications for this visit.       ___________________________________________________________________ Objective   Exam:  BP (!) 102/58   Pulse 80   Ht 5\' 3"  (1.6 m)   Wt 164 lb (74.4 kg)   BMI 29.05 kg/m    General: this is a(n) well-appearing woman  Eyes: sclera anicteric, no redness  ENT: oral mucosa moist without lesions, no cervical or supraclavicular lymphadenopathy, good dentition  CV: RRR without murmur, S1/S2, no JVD, no peripheral edema  Resp: clear to auscultation bilaterally, normal RR and effort noted  GI: soft, no tenderness, with active bowel sounds. No guarding or palpable organomegaly noted.  Skin; warm and dry, no rash or jaundice noted  Neuro: awake, alert and oriented x 3. Normal gross motor function and fluent speech  Labs:  CMP Latest Ref Rng & Units 04/20/2017 04/11/2017 04/10/2017  Glucose 65 - 99 mg/dL 81 115(H) 125(H)  BUN 6 - 24 mg/dL 10 <5(L) <5(L)  Creatinine 0.57 - 1.00 mg/dL 0.51(L) 0.47 0.47  Sodium 134 - 144 mmol/L 140 136 135  Potassium 3.5 - 5.2 mmol/L 5.0 4.1 4.2  Chloride 96 - 106 mmol/L 102 102 102  CO2 20 - 29 mmol/L 22 24 24   Calcium 8.7 - 10.2 mg/dL 9.6 8.6(L) 8.4(L)  Total Protein 6.0 - 8.5 g/dL 7.4 6.0(L) 5.9(L)  Total Bilirubin 0.0 - 1.2 mg/dL 0.9 2.0(H) 2.7(H)  Alkaline Phos 39 - 117 IU/L 104 197(H) 225(H)  AST 0 - 40 IU/L 34 34 32  ALT 0 - 32 IU/L 66(H) 55(H) 58(H)   TG 608 -> 162 lats hospital stay  [place]:  No gallstones.  CBD 24mm  Neg acute hep panel 04/07/17  Radiologic Studies:  CTAP  04/07/17:   Fatty liver, severe pancreatitis with ascites.  Images personally reviewed  Assessment: Encounter Diagnosis  Name Primary?  . Alcohol-induced acute pancreatitis with uninfected necrosis Yes    She  appears to have made a full recovery at this point.  I strongly encouraged her to have complete indefinite alcohol abstinence.  It seems no other workup is necessary at this time. I also feel she can stop the thiamine and folic acid.  Plan:  See me as needed.  Thank you for the courtesy of this consult.  Please call me with any questions or concerns.  Nelida Meuse III  CC: [name], MD

## 2017-05-17 NOTE — Patient Instructions (Signed)
If you are age 45 or older, your body mass index should be between 23-30. Your Body mass index is 29.05 kg/m. If this is out of the aforementioned range listed, please consider follow up with your Primary Care Provider.  If you are age 23 or younger, your body mass index should be between 19-25. Your Body mass index is 29.05 kg/m. If this is out of the aformentioned range listed, please consider follow up with your Primary Care Provider.   Follow up as needed.  Thank you for choosing [facility]  Dr Wilfrid Lund III

## 2017-05-24 ENCOUNTER — Ambulatory Visit: Payer: Self-pay | Attending: Internal Medicine | Admitting: Internal Medicine

## 2017-05-24 ENCOUNTER — Encounter: Payer: Self-pay | Admitting: Internal Medicine

## 2017-05-24 VITALS — BP 106/72 | HR 69 | Temp 98.4°F | Resp 16 | Wt 174.8 lb

## 2017-05-24 DIAGNOSIS — Z6830 Body mass index (BMI) 30.0-30.9, adult: Secondary | ICD-10-CM | POA: Insufficient documentation

## 2017-05-24 DIAGNOSIS — E785 Hyperlipidemia, unspecified: Secondary | ICD-10-CM | POA: Insufficient documentation

## 2017-05-24 DIAGNOSIS — F1021 Alcohol dependence, in remission: Secondary | ICD-10-CM | POA: Insufficient documentation

## 2017-05-24 DIAGNOSIS — E669 Obesity, unspecified: Secondary | ICD-10-CM | POA: Insufficient documentation

## 2017-05-24 DIAGNOSIS — Z87891 Personal history of nicotine dependence: Secondary | ICD-10-CM | POA: Insufficient documentation

## 2017-05-24 NOTE — Progress Notes (Signed)
Pt is needing lab results from 05/02/2017-05/03/2017

## 2017-05-24 NOTE — Progress Notes (Signed)
Patient ID: Kelsey Swanson, female    DOB: Jul 03, 1970  MRN: [id number]  CC: Follow-up   Subjective: Kelsey Swanson is a 47 y.o. female who presents for chronic ds management. Her concerns today include:  Patient with history of EtOH abuse,  Fatty liver, HL and alcoholic pancreatitis.  Since last visit with PA patient reports that she has discontinued drinking completely.  She is feeling much better.  Saw the gastroenterologist Dr. Loletha Carrow, last wk. she still had mild elevation in 1 of her transaminases. Patient inquiring about her cholesterol level.  I went over last set of labs.  She has a mild macrocytic anemia that has improved.  B12 and folate levels were normal.  Lipid profile from February revealed a total cholesterol of 321 LDL of 267.  She denies any chest pain shortness of breath, lower extremity edema or abdominal pain  Patient Active Problem List   Diagnosis Date Noted  . Normocytic anemia 04/08/2017  . Hyperbilirubinemia 04/08/2017  . Hypocalcemia 04/08/2017  . Hypokalemia 04/08/2017  . Acute alcoholic hepatitis 15/17/6160  . Thrombocytopenia ([place]) 04/07/2017  . Acute alcoholic pancreatitis 73/71/0626  . Jaundice   . Diabetes mellitus screening 07/23/2016  . Fatty liver 07/23/2016  . Obesity (BMI 30.0-34.9) 07/23/2016  . Acute pancreatitis without infection or necrosis 06/15/2016  . Hyponatremia 06/15/2016  . Elevated LFTs 06/15/2016  . Contraception 01/21/2016  . Non-English speaking patient 12/04/2015  . OBESITY, NOS 04/21/2006     Current Outpatient Medications on File Prior to Visit  Medication Sig Dispense Refill  . Ascorbic Acid (VITAMIN C) 1000 MG tablet Take 1,000 mg by mouth daily.    . folic acid (FOLVITE) 1 MG tablet Take 1 tablet (1 mg total) by mouth daily. 30 tablet 0  . Multiple Vitamins-Minerals (ALIVE ONCE DAILY WOMENS) TABS Take 1 tablet by mouth daily.    Marland Kitchen thiamine 100 MG tablet Take 1 tablet (100 mg total) by mouth daily. 30 tablet 0  .  traMADol (ULTRAM) 50 MG tablet Take 1 tablet (50 mg total) by mouth every 6 (six) hours. 30 tablet 0   No current facility-administered medications on file prior to visit.     No Known Allergies  Social History   Socioeconomic History  . Marital status: Married    Spouse name: Not on file  . Number of children: Not on file  . Years of education: Not on file  . Highest education level: Not on file  Occupational History  . Not on file  Social Needs  . Financial resource strain: Not on file  . Food insecurity:    Worry: Not on file    Inability: Not on file  . Transportation needs:    Medical: Not on file    Non-medical: Not on file  Tobacco Use  . Smoking status: Former Smoker    Packs/day: 0.30    Years: 10.00    Pack years: 3.00    Types: Cigarettes    Last attempt to quit: 06/07/2015    Years since quitting: 1.9  . Smokeless tobacco: Never Used  Substance and Sexual Activity  . Alcohol use: Not Currently  . Drug use: No  . Sexual activity: Yes    Partners: Male    Birth control/protection: None    Comment: early preg  Lifestyle  . Physical activity:    Days per week: Not on file    Minutes per session: Not on file  . Stress: Not on file  Relationships  .  Social connections:    Talks on phone: Not on file    Gets together: Not on file    Attends religious service: Not on file    Active member of club or organization: Not on file    Attends meetings of clubs or organizations: Not on file    Relationship status: Not on file  . Intimate partner violence:    Fear of current or ex partner: Not on file    Emotionally abused: Not on file    Physically abused: Not on file    Forced sexual activity: Not on file  Other Topics Concern  . Not on file  Social History Narrative  . Not on file    Family History  Problem Relation Age of Onset  . Diabetes Father   . Diabetes Maternal Aunt   . Ovarian cancer Maternal Aunt   . Diabetes Maternal Grandmother      Past Surgical History:  Procedure Laterality Date  . BREAST SURGERY     right breast  . ECTOPIC PREGNANCY SURGERY  2012    ROS: Review of Systems Negative except as stated above PHYSICAL EXAM: BP 106/72   Pulse 69   Temp 98.4 F (36.9 C) (Oral)   Resp 16   Wt 174 lb 12.8 oz (79.3 kg)   SpO2 100%   BMI 30.96 kg/m   Physical Exam  General appearance - alert, well appearing, and in no distress Mental status - alert, oriented to person, place, and time Chest - clear to auscultation, no wheezes, rales or rhonchi, symmetric air entry Heart - normal rate, regular rhythm, normal S1, S2, no murmurs, rubs, clicks or gallops Extremities - peripheral pulses normal, no pedal edema, no clubbing or cyanosis  ASSESSMENT AND PLAN: 1. Alcohol use disorder, severe, in early remission ([place]) Commended her on quitting.  Encourage her to remain free of alcohol.  She does not feel she needs to go to any treatment program.  Reports that she has good family support.  2. Hyperlipidemia, unspecified hyperlipidemia type -Encourage healthy eating habits and regular exercise.  She wanted to have her cholesterol rechecked.  I have requested that she return to the lab fasting to have this done - Lipid panel; Future - Hepatic function panel; Future   Patient was given the opportunity to ask questions.  Patient verbalized understanding of the plan and was able to repeat key elements of the plan.   Orders Placed This Encounter  Procedures  . Lipid panel  . Hepatic function panel     Requested Prescriptions    No prescriptions requested or ordered in this encounter    No follow-ups on file.  Karle Plumber, MD, FACP

## 2017-05-25 ENCOUNTER — Ambulatory Visit (HOSPITAL_COMMUNITY)
Admission: EM | Admit: 2017-05-25 | Discharge: 2017-05-25 | Disposition: A | Discharge status: Home / Self Care | Payer: Self-pay | Attending: Family Medicine | Admitting: Family Medicine

## 2017-05-25 ENCOUNTER — Encounter (HOSPITAL_COMMUNITY): Payer: Self-pay | Admitting: Emergency Medicine

## 2017-05-25 ENCOUNTER — Ambulatory Visit (INDEPENDENT_AMBULATORY_CARE_PROVIDER_SITE_OTHER): Payer: Self-pay

## 2017-05-25 DIAGNOSIS — S81811A Laceration without foreign body, right lower leg, initial encounter: Secondary | ICD-10-CM

## 2017-05-25 NOTE — ED Triage Notes (Signed)
PT fell from a standing position and struck leg on a metal water meter. PT has a laceration to right lower leg.

## 2017-05-25 NOTE — ED Provider Notes (Signed)
[name]   [id number] 05/25/17 Arrival Time: [place] PLAN:  1. Leg laceration, right, initial encounter    Procedure: Verbal consent obtained. Patient provided with risks and alternatives to the procedure. Wound copiously irrigated with NS then cleansed with betadine. Anesthetized with 5 mL of lidocaine with epinephrine. Wound carefully explored. No foreign body, tendon injury, or nonviable tissue were noted. Using sterile technique a single 3-0 Ethilon running suture wasplaced to reapproximate the wound. Patient tolerated procedure well. No complications. Minimal bleeding. Patient advised to look for and return for any signs of infection such as redness, swelling, discharge, or worsening pain. Return for suture removal in 7-10 days.  OTC analgesics.  Reviewed expectations re: course of current medical issues. Questions answered. Outlined signs and symptoms indicating need for more acute intervention. Patient verbalized understanding. After Visit Summary given.   SUBJECTIVE:  Kelsey Swanson is a 47 y.o. female who presents with a laceration to her R anterior lower leg a few hours ago. Reports slipping in yard with leg going into metal meter box in ground. Resulting cut to her leg. Moderate bleeding. Ambulatory without assistance since. Tender around wound. No extremity sensation changes or weakness.  Td UTD: Plans to call her doctor to confirm that she received Td within the past 5 years.  ROS: As per HPI.   OBJECTIVE:  Vitals:   05/25/17 1505  BP: 113/66  Pulse: 80  Resp: 16  Temp: 98.2 F (36.8 C)  TempSrc: Oral  SpO2: 100%  Weight: 174 lb (78.9 kg)     General appearance: alert; no distress Skin: laceration of R lower leg just below knee; size: approx 2 cm; minimal bleeding; knee with FROM Psychological: alert and cooperative; normal mood and affect   Labs Reviewed - No data to display  Dg Tibia/fibula Right  Result Date:  05/25/2017 CLINICAL DATA:  [place] 2 hours ago, injury to RIGHT lower leg, fell into a water meter hole EXAM: RIGHT TIBIA AND FIBULA - 2 VIEW COMPARISON:  None FINDINGS: Osseous mineralization normal. Joint spaces preserved. No fracture, dislocation, or bone destruction. Dressing artifacts at anterior proximal RIGHT lower leg overlying the tibial tubercle. IMPRESSION: No acute osseous abnormalities. Electronically Signed   By: Lavonia Dana M.D.   On: 05/25/2017 16:17    No Known Allergies  Past Medical History:  Diagnosis Date  . Abnormal Pap smear 1996  . Fatty liver   . Pancreatitis, chronic ([facility])    Social History   Socioeconomic History  . Marital status: Married    Spouse name: Not on file  . Number of children: Not on file  . Years of education: Not on file  . Highest education level: Not on file  Occupational History  . Not on file  Social Needs  . Financial resource strain: Not on file  . Food insecurity:    Worry: Not on file    Inability: Not on file  . Transportation needs:    Medical: Not on file    Non-medical: Not on file  Tobacco Use  . Smoking status: Former Smoker    Packs/day: 0.30    Years: 10.00    Pack years: 3.00    Types: Cigarettes    Last attempt to quit: 06/07/2015    Years since quitting: 1.9  . Smokeless tobacco: Never Used  Substance and Sexual Activity  . Alcohol use: Not Currently  . Drug use: No  . Sexual activity: Yes    Partners: Male  Birth control/protection: None    Comment: early preg  Lifestyle  . Physical activity:    Days per week: Not on file    Minutes per session: Not on file  . Stress: Not on file  Relationships  . Social connections:    Talks on phone: Not on file    Gets together: Not on file    Attends religious service: Not on file    Active member of club or organization: Not on file    Attends meetings of clubs or organizations: Not on file    Relationship status: Not on file  Other Topics Concern  . Not on file   Social History Narrative  . Not on file         Vanessa Kick, MD 05/26/17 1010

## 2017-05-25 NOTE — Discharge Instructions (Addendum)
Keep your wound clean and as dry as possible. Do not immerse or soak your wound in water. This means no swimming, washing dishes (unless thick rubber gloves are used), baths, or hot tubs until the stitches are removed. Leave original bandages on for the first 24 hours. After this time, showering or rinsing is recommended, rather than bathing. After the first 24 hours, remove old bandages and gently cleanse your wound with soap and water. Cleansing twice a day prevents buildup of debris and will result in easier suture removal. Return in 7-10 days for suture removal.  You may use over the counter ibuprofen or acetaminophen as needed.

## 2017-05-30 ENCOUNTER — Ambulatory Visit: Payer: Self-pay | Attending: Internal Medicine

## 2017-05-30 DIAGNOSIS — E785 Hyperlipidemia, unspecified: Secondary | ICD-10-CM | POA: Insufficient documentation

## 2017-05-30 NOTE — Progress Notes (Signed)
Patient here for lab visit only 

## 2017-05-31 LAB — HEPATIC FUNCTION PANEL
ALT: 22 IU/L (ref 0–32)
AST: 9 IU/L (ref 0–40)
Albumin: 4.9 g/dL (ref 3.5–5.5)
Alkaline Phosphatase: 71 IU/L (ref 39–117)
Bilirubin Total: <0.2 mg/dL (ref 0.0–1.2)
Bilirubin, Direct: 0.08 mg/dL (ref 0.00–0.40)
Total Protein: 7.5 g/dL (ref 6.0–8.5)

## 2017-05-31 LAB — LIPID PANEL
Chol/HDL Ratio: 3.8 ratio (ref 0.0–4.4)
Cholesterol, Total: 174 mg/dL (ref 100–199)
HDL: 46 mg/dL (ref >39)
LDL Calculated: 106 mg/dL — ABNORMAL HIGH (ref 0–99)
Triglycerides: 111 mg/dL (ref 0–149)
VLDL Cholesterol Cal: 22 mg/dL (ref 5–40)

## 2017-06-03 ENCOUNTER — Ambulatory Visit (HOSPITAL_COMMUNITY)
Admission: EM | Admit: 2017-06-03 | Discharge: 2017-06-03 | Disposition: A | Discharge status: Home / Self Care | Payer: Self-pay | Attending: Family Medicine | Admitting: Family Medicine

## 2017-06-03 DIAGNOSIS — Z4802 Encounter for removal of sutures: Secondary | ICD-10-CM

## 2017-06-03 NOTE — ED Triage Notes (Signed)
Running suture removed from leg. Wound healed and closed. No obvious signs of infection.

## 2017-06-27 ENCOUNTER — Ambulatory Visit: Payer: Self-pay | Attending: Internal Medicine

## 2017-07-20 ENCOUNTER — Ambulatory Visit: Payer: Self-pay | Attending: Family Medicine | Admitting: Physician Assistant

## 2017-07-20 VITALS — BP 112/74 | HR 87 | Temp 99.0°F | Resp 16 | Ht 62.0 in | Wt 186.6 lb

## 2017-07-20 DIAGNOSIS — M79641 Pain in right hand: Secondary | ICD-10-CM | POA: Insufficient documentation

## 2017-07-20 DIAGNOSIS — Z1239 Encounter for other screening for malignant neoplasm of breast: Secondary | ICD-10-CM

## 2017-07-20 DIAGNOSIS — R202 Paresthesia of skin: Secondary | ICD-10-CM | POA: Insufficient documentation

## 2017-07-20 DIAGNOSIS — Z1231 Encounter for screening mammogram for malignant neoplasm of breast: Secondary | ICD-10-CM

## 2017-07-20 DIAGNOSIS — G5603 Carpal tunnel syndrome, bilateral upper limbs: Secondary | ICD-10-CM | POA: Insufficient documentation

## 2017-07-20 DIAGNOSIS — M79642 Pain in left hand: Secondary | ICD-10-CM | POA: Insufficient documentation

## 2017-07-20 MED ORDER — NAPROXEN 500 MG PO TABS: 500.0000 mg | ORAL_TABLET | Freq: Two times a day (BID) | ORAL | 1 refills | Status: DC

## 2017-07-20 NOTE — Patient Instructions (Addendum)
Get wrist splints from [facility] for both hands.  Wear the splints day and night for 1 week then at night time for 2 weeks then as needed after that.   Sndrome del tnel carpiano (Carpal Tunnel Syndrome) El sndrome del tnel carpiano es una afeccin que causa dolor en la mano y en el brazo. El tnel carpiano es un espacio estrecho ubicado en el lado palmar de la [place]. Los movimientos repetidos de la mueca o determinadas enfermedades pueden causar la hinchazn del tnel. Esta hinchazn puede comprimir el nervio principal de la mueca (nervio mediano). CUIDADOS EN EL HOGAR Si tiene una frula:  sela como se lo haya indicado el mdico. Qutesela solamente como se lo haya indicado el mdico.  Afloje la frula si los dedos: ? Se le adormecen y siente hormigueos. ? Se le ponen azulados y fros.  Mantenga la frula limpia y seca. Instrucciones generales  [employer] de venta libre y los recetados solamente como se lo haya indicado el mdico.  Haga reposar la [place] de toda actividad que le cause dolor. Si es necesario, hable con su empleador [employer] cambios que pueden hacerse en su lugar de [place], por ejemplo, usar una almohadilla para apoyar la mueca mientras tipea.  Si se lo indican, aplique hielo sobre la zona dolorida: ? Field seismologist hielo en una bolsa plstica. ? Coloque una [employer] piel y la bolsa de hielo. ? Coloque el hielo durante 66minutos, 2 a 3veces por da.  Concurra a todas las visitas de control como se lo haya indicado el mdico. Esto es importante.  Haga los ejercicios como se lo hayan indicado el mdico, el fisioterapeuta o el terapeuta ocupacional. SOLICITE AYUDA SI:  Aparecen nuevos sntomas.  Los medicamentos no Forensic psychologist.  Los sntomas empeoran. Esta informacin no tiene Marine scientist el consejo del mdico. Asegrese de hacerle al mdico cualquier pregunta que tenga. Document Released: 01/28/2011 Document Revised:  10/30/2014 Document Reviewed: 06/26/2014 Elsevier Interactive Patient Education  2018 [employer].

## 2017-07-20 NOTE — Progress Notes (Signed)
Pt. Stated she have pain on both of her hands for 3 weeks.  Pt. Stated when she go to sleep and wake up in the morning it feels numbness.  Pt. Stated when she walks she gets pain from her leg.   Pt. Would like to get a mammogram.

## 2017-07-20 NOTE — Progress Notes (Signed)
Patient ID: Kelsey Swanson, female   DOB: [birth year], 47 y.o.   MRN: [id number]     Kelsey Swanson, is a 47 y.o. female  EYC:[id number]  JSH:[id number]  DOB - 25-Jul-1970  Subjective:  Chief Complaint and HPI: Kelsey Swanson is a 47 y.o. female here today for B hand paresthesias and pain for 3 weeks.  Worse at night.  Awakens her from sleep at times.  NKI.  Does a lot of repetitive work with hands.  Has 54 month old daughter.   Also c/o losing more hair than usual for a few months.    Wants to get MMG scheduled  ROS:   Constitutional:  No f/c, No night sweats, No unexplained weight loss. EENT:  No vision changes, No blurry vision, No hearing changes. No mouth, throat, or ear problems.  Respiratory: No cough, No SOB Cardiac: No CP, no palpitations GI:  No abd pain, No N/V/D. GU: No Urinary s/sx Musculoskeletal: B hand pain Neuro: No headache, no dizziness, no motor weakness.  Skin: No rash Endocrine:  No polydipsia. No polyuria.  Psych: Denies SI/HI  No problems updated.  ALLERGIES: No Known Allergies  PAST MEDICAL HISTORY: Past Medical History:  Diagnosis Date  . Abnormal Pap smear 1996  . Fatty liver   . Pancreatitis, chronic ([place])     MEDICATIONS AT HOME: Prior to Admission medications   Medication Sig Start Date End Date Taking? Authorizing Provider  Ascorbic Acid (VITAMIN C) 1000 MG tablet Take 1,000 mg by mouth daily.   Yes [provider]  folic acid (FOLVITE) 1 MG tablet Take 1 tablet (1 mg total) by mouth daily. 04/11/17  Yes Sheikh, Omair Latif, DO  Multiple Vitamins-Minerals (ALIVE ONCE DAILY WOMENS) TABS Take 1 tablet by mouth daily.   Yes [provider]  thiamine 100 MG tablet Take 1 tablet (100 mg total) by mouth daily. 04/11/17  Yes Sheikh, Omair Latif, DO  naproxen (NAPROSYN) 500 MG tablet Take 1 tablet (500 mg total) by mouth 2 (two) times daily with a meal. X 10 days then prn pain 07/20/17   Argentina Donovan, PA-C  traMADol  (ULTRAM) 50 MG tablet Take 1 tablet (50 mg total) by mouth every 6 (six) hours. Patient not taking: Reported on 07/20/2017 04/20/17   Brayton Caves, PA-C     Objective:  EXAM:   Vitals:   07/20/17 1414  BP: 112/74  Pulse: 87  Resp: 16  Temp: 99 F (37.2 C)  TempSrc: Oral  SpO2: 97%  Weight: 186 lb 9.6 oz (84.6 kg)  Height: 5\' 2"  (1.575 m)    General appearance : A&OX3. NAD. Non-toxic-appearing HEENT: Atraumatic and Normocephalic.  PERRLA. EOM intact.   No areas on scalp of gross alopecia.   Neck: supple, no JVD. No cervical lymphadenopathy. No thyromegaly Chest/Lungs:  Breathing-non-labored, Good air entry bilaterally, breath sounds normal without rales, rhonchi, or wheezing  CVS: S1 S2 regular, no murmurs, gallops, rubs  B hands/wrists:  Normal grip and strength and ROM.  +Phalen's.  Neg tinel's.   Extremities: Bilateral Lower Ext shows no edema, both legs are warm to touch with = pulse throughout Neurology:  CN II-XII grossly intact, Non focal.   Psych:  TP linear. J/I WNL. Normal speech. Appropriate eye contact and affect.  Skin:  No Rash  Data Review Lab Results  Component Value Date   HGBA1C 5.3 07/23/2016     Assessment & Plan   1. Breast cancer screening - MM Digital Screening; Future  2. Paresthesia - TSH - Vitamin D, 25-hydroxy  3. Bilateral carpal tunnel syndrome Get wrist splints from [facility] for both hands.  Wear the splints day and night for 1 week then at night time for 2 weeks then as needed after that. Educational information given - TSH - Vitamin D, 25-hydroxy  Patient have been counseled extensively about nutrition and exercise  Return for Dr Maryclare Labrador CTS. in 3 months; sooner if needed/not improving.  The patient was given clear instructions to go to ER or return to medical center if symptoms don't improve, worsen or new problems develop. The patient verbalized understanding. The patient was told to call to get lab results if they  haven't heard anything in the next week.     Freeman Caldron, PA-C [facility] [place], [place]   07/20/2017, 2:31 PM

## 2017-07-21 LAB — VITAMIN D 25 HYDROXY (VIT D DEFICIENCY, FRACTURES): Vit D, 25-Hydroxy: 26 ng/mL — ABNORMAL LOW (ref 30.0–100.0)

## 2017-07-21 LAB — TSH: TSH: 1.73 u[IU]/mL (ref 0.450–4.500)

## 2017-07-22 ENCOUNTER — Telehealth: Payer: Self-pay

## 2017-07-22 NOTE — Telephone Encounter (Signed)
-----   Message from Argentina Donovan, [place] sent at 07/21/2017  1:27 PM EDT ----- Please call patient and let them that their vitamin D is low.  This can contribute to muscle aches, anxiety, fatigue, and depression.  Take 2000 units vitamin D daily.  We will recheck this level in 3-4 months.  Thyroid studies were normal.  Follow-up as planned. Thanks, Freeman Caldron, PA-C

## 2017-07-22 NOTE — Telephone Encounter (Signed)
CMA spoke to patient to inform on lab results.  Patient understood. Patient verified DOB.  

## 2017-08-31 ENCOUNTER — Other Ambulatory Visit (HOSPITAL_COMMUNITY): Payer: Self-pay | Admitting: *Deleted

## 2017-08-31 DIAGNOSIS — Z0000 Encounter for general adult medical examination without abnormal findings: Secondary | ICD-10-CM

## 2017-09-02 ENCOUNTER — Inpatient Hospital Stay: Payer: Self-pay

## 2017-09-02 ENCOUNTER — Inpatient Hospital Stay: Payer: Self-pay | Attending: Obstetrics and Gynecology | Admitting: *Deleted

## 2017-09-02 VITALS — BP 110/72 | Ht 63.0 in | Wt 186.5 lb

## 2017-09-02 DIAGNOSIS — Z0000 Encounter for general adult medical examination without abnormal findings: Secondary | ICD-10-CM

## 2017-09-02 LAB — LIPID PANEL
Cholesterol: 214 mg/dL — ABNORMAL HIGH (ref 0–200)
HDL: 49 mg/dL (ref >40)
LDL Cholesterol: 138 mg/dL — ABNORMAL HIGH (ref 0–99)
Total CHOL/HDL Ratio: 4.4 RATIO
Triglycerides: 135 mg/dL (ref <150)
VLDL: 27 mg/dL (ref 0–40)

## 2017-09-02 LAB — HEMOGLOBIN A1C
Hgb A1c MFr Bld: 5.5 % (ref 4.8–5.6)
Mean Plasma Glucose: 111.15 mg/dL

## 2017-09-02 NOTE — Addendum Note (Signed)
Addended by: Hazeline Junker on: 09/02/2017 11:31 AM   Modules accepted: Orders

## 2017-09-02 NOTE — Progress Notes (Signed)
[facility] initial screening  [place]  Clinical Measurement:  Height: 63in.  Weight: 186.5lb  Blood Pressure: 120/80  Blood Pressure #2:  110/72 Fasting Labs Drawn Today, will review with patient when they result.  Medical History:  Patient states that she has not been diagnosed with high cholesterol, high blood pressure, diabetes or heart disease.  Medications:  Patients states she is not taking any medications for high cholesterol, high blood pressure or diabetes.  She is not taking aspirin daily to prevent heart attack or stroke.    Blood pressure, self measurement:  Patients states she does not measure blood pressure at home.    Nutrition:  Patient states she eats 0 cups of fruit and 2 cups of vegetables in an average day.  Patient states she does not eat fish regularly, she eats more than half a serving of whole grains daily. She drinks less than 36 ounces of beverages with added sugar weekly.  She is currently watching her sodium intake.  She has not had any drinks containing alcohol in the last seven days.    Physical activity:  Patient states that she gets 0 minutes of moderate exercise in a week.  She gets 0 minutes of vigorous exercise per week.    Smoking status:  Patient states she has never smoked and is not around any smokers.    Quality of life:  Patient states that she has had 0 bad physical days out of the last 30 days. In the last 2 weeks, she has had 0 days that she has felt down or depressed. She has had 0 days in the last 2 weeks that she has had little interest or pleasure in doing things.  Risk reduction and counseling:  Patient states she wants to lose weight and increase fruit and vegetable intake.  I encouraged her to start an  exercise regimen and increase vegetable and fruit intake.  Navigation:  I will notify patient of lab results.  Patient is aware of 2 more health coaching sessions and a follow up.

## 2017-09-09 ENCOUNTER — Other Ambulatory Visit (HOSPITAL_COMMUNITY): Payer: Self-pay | Admitting: *Deleted

## 2017-09-09 ENCOUNTER — Telehealth (HOSPITAL_COMMUNITY): Payer: Self-pay | Admitting: *Deleted

## 2017-09-09 DIAGNOSIS — N644 Mastodynia: Secondary | ICD-10-CM

## 2017-09-09 NOTE — Telephone Encounter (Signed)
Health Coaching 2  Labs- cholesterol 214, triglycerides 135,HDL 49, LDL 138, hemoglobin A1C 5.5, mean plasma glucose 111.15  Patient is aware and understands these results.  Goals- Patient states she will eat less fried food , lower her carbs.  She states she will increase her exercise regimen.   Navigation:  Patient is aware of 1 more health coaching session and a follow up.

## 2017-09-29 ENCOUNTER — Ambulatory Visit (HOSPITAL_COMMUNITY): Payer: Self-pay

## 2017-10-17 ENCOUNTER — Ambulatory Visit: Payer: Self-pay | Admitting: Internal Medicine

## 2017-10-20 ENCOUNTER — Ambulatory Visit (HOSPITAL_COMMUNITY)
Admission: RE | Admit: 2017-10-20 | Discharge: 2017-10-20 | Disposition: A | Discharge status: Home / Self Care | Payer: Self-pay | Source: Ambulatory Visit | Attending: Obstetrics and Gynecology | Admitting: Obstetrics and Gynecology

## 2017-10-20 ENCOUNTER — Ambulatory Visit
Admission: RE | Admit: 2017-10-20 | Discharge: 2017-10-20 | Disposition: A | Discharge status: Home / Self Care | Payer: No Typology Code available for payment source | Source: Ambulatory Visit | Attending: Obstetrics and Gynecology | Admitting: Obstetrics and Gynecology

## 2017-10-20 ENCOUNTER — Other Ambulatory Visit: Payer: Self-pay | Admitting: Obstetrics and Gynecology

## 2017-10-20 ENCOUNTER — Encounter (HOSPITAL_COMMUNITY): Payer: Self-pay

## 2017-10-20 VITALS — BP 118/76 | Wt 194.0 lb

## 2017-10-20 DIAGNOSIS — N644 Mastodynia: Secondary | ICD-10-CM

## 2017-10-20 DIAGNOSIS — N63 Unspecified lump in unspecified breast: Secondary | ICD-10-CM

## 2017-10-20 DIAGNOSIS — Z1239 Encounter for other screening for malignant neoplasm of breast: Secondary | ICD-10-CM

## 2017-10-20 NOTE — Addendum Note (Signed)
Encounter addended by: Loletta Parish, RN on: 10/20/2017 10:35 AM  Actions taken: Sign clinical note

## 2017-10-20 NOTE — Progress Notes (Addendum)
Complaints of right axillary and outer breast pain x 2 months that is constant. Patient rated the pain at a 4 out of 10.  Pap Smear: Pap smear not completed today. Last Pap smear was 07/01/2015 and normal with negative HPV. Per patient has a history of two abnormal Pap smears 12/06/2011 that was LSIL and no follow-up completed and another in 1996 that a colposcopy was completed for follow-up.Patient has had only two normal Pap smears since abnormal. Unable to complete patients Pap smear today due to patient is currently having vaginal bleeding related to her birth control. Made appointment for patient to come to the free cervical cancer screening at the [facility] on Monday, December 05, 2017 at [place]. Last two Pap smear results are in Epic.  Physical exam: Breasts Breasts symmetrical. No skin abnormalities bilateral breasts. Bilateral nipple inversion that per patient is normal for her. No nipple discharge bilateral breasts. No lymphadenopathy. No lumps palpated bilateral breasts. Complaints of right breast tenderness at 9 o'clock on exam. Referred patient to the [facility] for a diagnostic mammogram. Appointment scheduled for Thursday, October 20, 2017 at 1050.        Pelvic/Bimanual No Pap smear completed today since patient is currently having vaginal bleeding related to her birth control that is greater than spotting.  Smoking History: Patient is a former smoker that quit 06/07/2015.  Patient Navigation: Patient education provided. Access to services provided for patient through [facility] program. Spanish interpreter provided.   Breast and Cervical Cancer Risk Assessment: Patient has no family history of breast cancer, known genetic mutations, or radiation treatment to the chest before age 76. Patient has a history of cervical dysplasia. Patient has no history of being immunocompromised or DES exposure in-utero. Risk Assessment    Risk Scores      10/20/2017   Last  edited by: Armond Hang, LPN   5-year risk: 0.4 %   Lifetime risk: 5.5 %         Used Spanish interpreter Rudene Anda from [place].

## 2017-10-20 NOTE — Patient Instructions (Addendum)
Explained breast self awareness with Kelsey Swanson. Unable to complete patients Pap smear today due to patient is currently having vaginal bleeding related to her birth control. Made appointment for patient to come to the free cervical cancer screening at the [facility] on Monday, December 05, 2017 at [place]. Patient aware of appointment and will be there. Referred patient to the [facility] for a diagnostic mammogram. Appointment scheduled for Thursday, October 20, 2017 at 1050. Kelsey Swanson verbalized understanding.  Kyra Laffey, Arvil [place], RN 9:30 AM

## 2017-10-21 ENCOUNTER — Encounter (HOSPITAL_COMMUNITY): Payer: Self-pay | Admitting: *Deleted

## 2018-01-05 ENCOUNTER — Ambulatory Visit: Payer: Self-pay | Attending: Internal Medicine | Admitting: Physician Assistant

## 2018-01-05 VITALS — BP 108/68 | HR 98 | Temp 98.3°F | Resp 18 | Ht 64.0 in | Wt 196.0 lb

## 2018-01-05 DIAGNOSIS — Z791 Long term (current) use of non-steroidal anti-inflammatories (NSAID): Secondary | ICD-10-CM | POA: Insufficient documentation

## 2018-01-05 DIAGNOSIS — G5603 Carpal tunnel syndrome, bilateral upper limbs: Secondary | ICD-10-CM | POA: Insufficient documentation

## 2018-01-05 DIAGNOSIS — K861 Other chronic pancreatitis: Secondary | ICD-10-CM | POA: Insufficient documentation

## 2018-01-05 DIAGNOSIS — K760 Fatty (change of) liver, not elsewhere classified: Secondary | ICD-10-CM | POA: Insufficient documentation

## 2018-01-05 MED ORDER — NAPROXEN 500 MG PO TABS: 500.0000 mg | ORAL_TABLET | Freq: Two times a day (BID) | ORAL | 1 refills | Status: DC

## 2018-01-05 MED FILL — NAPROXEN 500 MG TABLET: 500 | 30 days supply | Qty: 60 | Fill #0

## 2018-01-05 NOTE — Patient Instructions (Signed)
Sndrome del tnel carpiano (Carpal Tunnel Syndrome) El sndrome del tnel carpiano es una afeccin que causa dolor en la mano y en el brazo. El tnel carpiano es un espacio estrecho ubicado en el lado palmar de la [place]. Los movimientos repetidos de la mueca o determinadas enfermedades pueden causar la hinchazn del tnel. Esta hinchazn puede comprimir el nervio principal de la mueca (nervio mediano). CUIDADOS EN EL HOGAR Si tiene una frula:  sela como se lo haya indicado el mdico. Qutesela solamente como se lo haya indicado el mdico.  Afloje la frula si los dedos: ? Se le adormecen y siente hormigueos. ? Se le ponen azulados y fros.  Mantenga la frula limpia y seca. Instrucciones generales  [employer] de venta libre y los recetados solamente como se lo haya indicado el mdico.  Haga reposar la [place] de toda actividad que le cause dolor. Si es necesario, hable con su empleador [employer] cambios que pueden hacerse en su lugar de [place], por ejemplo, usar una almohadilla para apoyar la mueca mientras tipea.  Si se lo indican, aplique hielo sobre la zona dolorida: ? Field seismologist hielo en una bolsa plstica. ? Coloque una [employer] piel y la bolsa de hielo. ? Coloque el hielo durante 14minutos, 2 a 3veces por da.  Concurra a todas las visitas de control como se lo haya indicado el mdico. Esto es importante.  Haga los ejercicios como se lo hayan indicado el mdico, el fisioterapeuta o el terapeuta ocupacional. SOLICITE AYUDA SI:  Aparecen nuevos sntomas.  Los medicamentos no Forensic psychologist.  Los sntomas empeoran. Esta informacin no tiene Marine scientist el consejo del mdico. Asegrese de hacerle al mdico cualquier pregunta que tenga. Document Released: 01/28/2011 Document Revised: 10/30/2014 Document Reviewed: 06/26/2014 Elsevier Interactive Patient Education  2018 [employer].

## 2018-01-05 NOTE — Progress Notes (Signed)
Patient ID: Kelsey Swanson, female   DOB: 1970-11-08, 47 y.o.   MRN: [id number]   Kelsey Swanson, is a 47 y.o. female  [id number]:[id number]  [id number]:[id number]  DOB - 1970-09-15  Subjective:  Chief Complaint and HPI: Kelsey Swanson is a 47 y.o. female here today for continued problems with CTS B R>L.  She is R hand dominant.  Worse at night.  She is wearing wrist splints most nights.  First line treatment has failed.  +paresthesias.    ROS:   Constitutional:  No f/c, No night sweats, No unexplained weight loss. EENT:  No vision changes, No blurry vision, No hearing changes. No mouth, throat, or ear problems.  Respiratory: No cough, No SOB Cardiac: No CP, no palpitations GI:  No abd pain, No N/V/D. GU: No Urinary s/sx Musculoskeletal: B hand pain Neuro: No headache, no dizziness, no motor weakness.  Skin: No rash Endocrine:  No polydipsia. No polyuria.  Psych: Denies SI/HI  No problems updated.  ALLERGIES: No Known Allergies  PAST MEDICAL HISTORY: Past Medical History:  Diagnosis Date  . Abnormal Pap smear 1996  . Fatty liver   . Pancreatitis, chronic ([place])     MEDICATIONS AT HOME: Prior to Admission medications   Medication Sig Start Date End Date Taking? Authorizing Provider  Ascorbic Acid (VITAMIN C) 1000 MG tablet Take 1,000 mg by mouth daily.   Yes [provider]  Multiple Vitamins-Minerals (ALIVE ONCE DAILY WOMENS) TABS Take 1 tablet by mouth daily.   Yes [provider]  traMADol (ULTRAM) 50 MG tablet Take 1 tablet (50 mg total) by mouth every 6 (six) hours. 04/20/17  Yes Ena Dawley, Tiffany S, PA-C  folic acid (FOLVITE) 1 MG tablet Take 1 tablet (1 mg total) by mouth daily. Patient not taking: Reported on 10/20/2017 04/11/17   Raiford Noble Latif, DO  naproxen (NAPROSYN) 500 MG tablet Take 1 tablet (500 mg total) by mouth 2 (two) times daily with a meal. X 10 days then prn pain 01/05/18   Freeman Caldron M, PA-C  thiamine 100 MG tablet Take 1 tablet (100 mg  total) by mouth daily. Patient not taking: Reported on 10/20/2017 04/11/17   Raiford Noble Latif, DO     Objective:  EXAM:   Vitals:   01/05/18 1027  BP: 108/68  Pulse: 98  Resp: 18  Temp: 98.3 F (36.8 C)  TempSrc: Oral  SpO2: 98%  Weight: 196 lb (88.9 kg)  Height: 5\' 4"  (1.626 m)    General appearance : A&OX3. NAD. Non-toxic-appearing HEENT: Atraumatic and Normocephalic.  PERRLA. EOM intact.   Neck: supple, no JVD. No cervical lymphadenopathy. No thyromegaly Chest/Lungs:  Breathing-non-labored, Good air entry bilaterally, breath sounds normal without rales, rhonchi, or wheezing  CVS: S1 S2 regular, no murmurs, gallops, rubs  B hands examined.  Full S&ROM with normal grip.  +Phalen's B, neg Tinels.  Extremities: Bilateral Lower Ext shows no edema, both legs are warm to touch with = pulse throughout Neurology:  CN II-XII grossly intact, Non focal.   Psych:  TP linear. J/I WNL. Normal speech. Appropriate eye contact and affect.  Skin:  No Rash  Data Review Lab Results  Component Value Date   HGBA1C 5.5 09/02/2017   HGBA1C 5.3 07/23/2016     Assessment & Plan   1. Bilateral carpal tunnel syndrome Wear wrist splints ever night and in the daytime as needed.   - naproxen (NAPROSYN) 500 MG tablet; Take 1 tablet (500 mg total) by mouth 2 (two) times  daily with a meal. X 10 days then prn pain  Dispense: 60 tablet; Refill: 1 - Ambulatory referral to Hand Surgery     Patient have been counseled extensively about nutrition and exercise  Return in about 3 months (around 04/07/2018) for Dr Wynetta Emery.  The patient was given clear instructions to go to ER or return to medical center if symptoms don't improve, worsen or new problems develop. The patient verbalized understanding. The patient was told to call to get lab results if they haven't heard anything in the next week.     Freeman Caldron, PA-C [facility], [place]     01/05/2018, 10:58 AM

## 2018-02-20 ENCOUNTER — Ambulatory Visit (HOSPITAL_COMMUNITY)
Admission: RE | Admit: 2018-02-20 | Discharge: 2018-02-20 | Disposition: A | Discharge status: Home / Self Care | Payer: No Typology Code available for payment source | Source: Ambulatory Visit | Attending: Internal Medicine | Admitting: Internal Medicine

## 2018-02-20 ENCOUNTER — Encounter: Payer: Self-pay | Admitting: Internal Medicine

## 2018-02-20 ENCOUNTER — Ambulatory Visit: Payer: Self-pay | Attending: Internal Medicine | Admitting: Internal Medicine

## 2018-02-20 VITALS — BP 120/76 | HR 84 | Temp 98.6°F | Resp 16 | Wt 195.8 lb

## 2018-02-20 DIAGNOSIS — E669 Obesity, unspecified: Secondary | ICD-10-CM | POA: Insufficient documentation

## 2018-02-20 DIAGNOSIS — K859 Acute pancreatitis without necrosis or infection, unspecified: Secondary | ICD-10-CM | POA: Insufficient documentation

## 2018-02-20 DIAGNOSIS — M25461 Effusion, right knee: Secondary | ICD-10-CM | POA: Insufficient documentation

## 2018-02-20 DIAGNOSIS — M25561 Pain in right knee: Secondary | ICD-10-CM | POA: Insufficient documentation

## 2018-02-20 DIAGNOSIS — Z87891 Personal history of nicotine dependence: Secondary | ICD-10-CM | POA: Insufficient documentation

## 2018-02-20 MED ORDER — MELOXICAM 15 MG PO TABS: 15.0000 mg | ORAL_TABLET | Freq: Every day | ORAL | 1 refills | Status: DC

## 2018-02-20 MED ORDER — TRAMADOL HCL 50 MG PO TABS: 50.0000 mg | ORAL_TABLET | Freq: Three times a day (TID) | ORAL | 0 refills | Status: DC | PRN

## 2018-02-20 MED FILL — MELOXICAM 15 MG TABLET: 15 | 30 days supply | Qty: 30 | Fill #0

## 2018-02-20 MED FILL — NAPROXEN 500 MG TABLET: 500 | 30 days supply | Qty: 60 | Fill #1

## 2018-02-20 MED FILL — traMADol HCL 50 MG TABS: 50 | 6 days supply | Qty: 20 | Fill #0

## 2018-02-20 NOTE — Patient Instructions (Signed)
Stop Naprosyn. Start Meloxicam instead.  Use the Tramadol as needed for pain.  It can cause drowsiness Please go to [facility] Radiology Department to get the x-rays done on the right knee.   Follow up if no improvement.

## 2018-02-20 NOTE — Progress Notes (Signed)
Patient ID: Kelsey Swanson, female    DOB: 11-24-1970  MRN: [id number]  CC: Knee Pain   Subjective: Kelsey Swanson is a 47 y.o. female who presents for UC visit Her concerns today include:  Patient with history of EtOH abuse,  Fatty liver, HL and alcoholic pancreatitis.  C/O swelling in RT knee 1 mth ago for 1 wk -1 wk ago, she started having swelling and pain in this knee Worse when walking and sometimes at nights.  No initiating factors.  Did have a fall in 05/2017 where she sustain a laceration several inches below the RT knee. -no fever. -taking Naprosyn Q 6 hrs, suppose to be   BID Patient Active Problem List   Diagnosis Date Noted  . Normocytic anemia 04/08/2017  . Hyperbilirubinemia 04/08/2017  . Hypocalcemia 04/08/2017  . Hypokalemia 04/08/2017  . Acute alcoholic hepatitis 61/60/7371  . Thrombocytopenia ([place]) 04/07/2017  . Acute alcoholic pancreatitis 08/18/9483  . Jaundice   . Diabetes mellitus screening 07/23/2016  . Fatty liver 07/23/2016  . Obesity (BMI 30.0-34.9) 07/23/2016  . Acute pancreatitis without infection or necrosis 06/15/2016  . Hyponatremia 06/15/2016  . Elevated LFTs 06/15/2016  . Contraception 01/21/2016  . Non-English speaking patient 12/04/2015  . OBESITY, NOS 04/21/2006     Current Outpatient Medications on File Prior to Visit  Medication Sig Dispense Refill  . Ascorbic Acid (VITAMIN C) 1000 MG tablet Take 1,000 mg by mouth daily.    . Multiple Vitamins-Minerals (ALIVE ONCE DAILY WOMENS) TABS Take 1 tablet by mouth daily.     No current facility-administered medications on file prior to visit.     No Known Allergies  Social History   Socioeconomic History  . Marital status: Married    Spouse name: Not on file  . Number of children: Not on file  . Years of education: Not on file  . Highest education level: Not on file  Occupational History  . Not on file  Social Needs  . Financial resource strain: Not on file  . Food  insecurity:    Worry: Not on file    Inability: Not on file  . Transportation needs:    Medical: Not on file    Non-medical: Not on file  Tobacco Use  . Smoking status: Former Smoker    Packs/day: 0.30    Years: 10.00    Pack years: 3.00    Types: Cigarettes    Last attempt to quit: 06/07/2015    Years since quitting: 2.7  . Smokeless tobacco: Never Used  Substance and Sexual Activity  . Alcohol use: Not Currently  . Drug use: No  . Sexual activity: Yes    Partners: Male    Birth control/protection: None, Implant    Comment: early preg  Lifestyle  . Physical activity:    Days per week: Not on file    Minutes per session: Not on file  . Stress: Not on file  Relationships  . Social connections:    Talks on phone: Not on file    Gets together: Not on file    Attends religious service: Not on file    Active member of club or organization: Not on file    Attends meetings of clubs or organizations: Not on file    Relationship status: Not on file  . Intimate partner violence:    Fear of current or ex partner: Not on file    Emotionally abused: Not on file    Physically abused: Not  on file    Forced sexual activity: Not on file  Other Topics Concern  . Not on file  Social History Narrative  . Not on file    Family History  Problem Relation Age of Onset  . Diabetes Father   . Diabetes Maternal Aunt   . Ovarian cancer Maternal Aunt   . Diabetes Maternal Grandmother   . Breast cancer Neg Hx     Past Surgical History:  Procedure Laterality Date  . BREAST EXCISIONAL BIOPSY Right   . BREAST SURGERY     right breast  . ECTOPIC PREGNANCY SURGERY  2012    ROS: Review of Systems Neg except as above PHYSICAL EXAM: BP 120/76   Pulse 84   Temp 98.6 F (37 C) (Oral)   Resp 16   Wt 195 lb 12.8 oz (88.8 kg)   SpO2 98%   BMI 33.61 kg/m   Physical Exam  General appearance - alert, well appearing, and in no distress Mental status - normal mood, behavior, speech,  dress, motor activity, and thought processes Musculoskeletal -RT knee:  Mild edema.  Mild point tenderness just below the medical jt line.  Mild crepitus laterally on passive ROM.  Anterior/posterior draw test neg.  Grind test negative   ASSESSMENT AND PLAN: 1. Acute pain of right knee ? Arthritis with small amount of jt fluid Stop Naprosyn Start Meloxicam instead.  Limited supply of Tramadol given.  Knee x-ray ordered. NCCSRS reviewed - meloxicam (MOBIC) 15 MG tablet; Take 1 tablet (15 mg total) by mouth daily.  Dispense: 30 tablet; Refill: 1 - traMADol (ULTRAM) 50 MG tablet; Take 1 tablet (50 mg total) by mouth every 8 (eight) hours as needed.  Dispense: 20 tablet; Refill: 0 - DG Knee Complete 4 Views Right; Future    Patient was given the opportunity to ask questions.  Patient verbalized understanding of the plan and was able to repeat key elements of the plan.   Orders Placed This Encounter  Procedures  . DG Knee Complete 4 Views Right     Requested Prescriptions   Signed Prescriptions Disp Refills  . meloxicam (MOBIC) 15 MG tablet 30 tablet 1    Sig: Take 1 tablet (15 mg total) by mouth daily.  . traMADol (ULTRAM) 50 MG tablet 20 tablet 0    Sig: Take 1 tablet (50 mg total) by mouth every 8 (eight) hours as needed.    Return in about 1 month (around 03/23/2018).  Karle Plumber, MD, FACP

## 2018-02-21 ENCOUNTER — Telehealth: Payer: Self-pay

## 2018-02-21 NOTE — Telephone Encounter (Signed)
Contacted pt and went over xray results pt doesn't have any questions or concerns

## 2018-04-25 ENCOUNTER — Other Ambulatory Visit: Payer: No Typology Code available for payment source

## 2018-05-01 ENCOUNTER — Other Ambulatory Visit: Payer: Self-pay | Admitting: Obstetrics & Gynecology

## 2018-05-01 DIAGNOSIS — Z124 Encounter for screening for malignant neoplasm of cervix: Secondary | ICD-10-CM

## 2018-05-01 NOTE — Progress Notes (Signed)
Patient: Kelsey Swanson           Date of Birth: [birth year]           MRN: [id number] Visit Date: 05/01/2018 PCP: [name], MD  Cervical Cancer Screening Do you smoke?: No Have you ever had or been told you have an allergy to latex products?: No Marital status: Married Date of last pap smear: Don't know Date of last menstrual period: 04/10/18 Number of pregnancies: 5 Number of births: 3 Have you ever had any of the following? Hysterectomy: No Tubal ligation (tubes tied): No Abnormal bleeding: Yes Abnormal pap smear: Yes Venereal warts: No A sex partner with venereal warts: No A high risk* sex partner: No  Cervical Exam  Abnormal Observations: Pt had bleeding before the Nexplanon was removed.  Now she is fine. Pt had abnml pap in 2013 with no follow up. Recommendations: Pap per protocol.  If abnml will treat based on results.       Patient's History Patient Active Problem List   Diagnosis Date Noted  . Normocytic anemia 04/08/2017  . Acute alcoholic hepatitis 02/24/7251  . Thrombocytopenia ([place]) 04/07/2017  . Acute alcoholic pancreatitis 66/44/0347  . Fatty liver 07/23/2016  . Obesity (BMI 30.0-34.9) 07/23/2016  . Contraception 01/21/2016  . OBESITY, NOS 04/21/2006   Past Medical History:  Diagnosis Date  . Abnormal Pap smear 1996  . Fatty liver   . Pancreatitis, chronic ([facility])     Family History  Problem Relation Age of Onset  . Diabetes Father   . Diabetes Maternal Aunt   . Ovarian cancer Maternal Aunt   . Diabetes Maternal Grandmother   . Breast cancer Neg Hx     Social History   Occupational History  . Not on file  Tobacco Use  . Smoking status: Former Smoker    Packs/day: 0.30    Years: 10.00    Pack years: 3.00    Types: Cigarettes    Last attempt to quit: 06/07/2015    Years since quitting: 2.9  . Smokeless tobacco: Never Used  Substance and Sexual Activity  . Alcohol use: Not Currently  . Drug use: No  . Sexual activity: Yes     Partners: Male    Birth control/protection: None, Implant    Comment: early preg

## 2018-05-05 LAB — CYTOLOGY - PAP: Diagnosis: NEGATIVE

## 2018-05-08 ENCOUNTER — Other Ambulatory Visit: Payer: Self-pay | Admitting: Obstetrics and Gynecology

## 2018-05-08 ENCOUNTER — Ambulatory Visit
Admission: RE | Admit: 2018-05-08 | Discharge: 2018-05-08 | Disposition: A | Discharge status: Home / Self Care | Payer: No Typology Code available for payment source | Source: Ambulatory Visit | Attending: Obstetrics and Gynecology | Admitting: Obstetrics and Gynecology

## 2018-05-08 ENCOUNTER — Other Ambulatory Visit: Payer: Self-pay

## 2018-05-08 DIAGNOSIS — N63 Unspecified lump in unspecified breast: Secondary | ICD-10-CM

## 2018-05-18 ENCOUNTER — Ambulatory Visit: Payer: No Typology Code available for payment source

## 2018-05-22 ENCOUNTER — Telehealth (HOSPITAL_COMMUNITY): Payer: Self-pay | Admitting: *Deleted

## 2018-05-22 NOTE — Telephone Encounter (Signed)
Normal Pap smear result letter mailed to patient by Cyt[facility].

## 2018-05-30 ENCOUNTER — Telehealth: Payer: Self-pay | Admitting: Internal Medicine

## 2018-05-30 NOTE — Telephone Encounter (Signed)
1) Medication(s) Requested (by name) naperoxen  2) Pharmacy of Choice: [facility]  3) Special Requests:   Approved medications will be sent to the pharmacy, we will reach out if there is an issue.  Requests made after 3pm may not be addressed until the following business day!  If a patient is unsure of the name of the medication(s) please note and ask patient to call back when they are able to provide all info, do not send to responsible party until all information is available!

## 2018-05-31 NOTE — Telephone Encounter (Signed)
Naproxen has been discontinued and replaced by Meloxicam, the patient has refills with [facility] pharmacy for the meloxicam if she would like it refilled.

## 2018-06-01 NOTE — Telephone Encounter (Signed)
Pt called back she states that the naproxen was prescribed to her for her hand pain and the meloxicam was for her knee pain she states that the meloxicam does not help her with her arm pain and they discontinued so she wouldn't mix it and she would prefer to get the naproxen cause her arm pain is severe please follow up

## 2018-06-01 NOTE — Telephone Encounter (Signed)
Attempted to contact patient no answer lvm to call back

## 2018-06-03 MED ORDER — NAPROXEN 500 MG PO TABS: 500.0000 mg | ORAL_TABLET | Freq: Two times a day (BID) | ORAL | 1 refills | Status: DC | PRN

## 2018-06-26 ENCOUNTER — Emergency Department (HOSPITAL_COMMUNITY)
Admission: EM | Admit: 2018-06-26 | Discharge: 2018-06-27 | Disposition: A | Discharge status: Home / Self Care | Payer: Medicaid Other | Attending: Emergency Medicine | Admitting: Emergency Medicine

## 2018-06-26 ENCOUNTER — Other Ambulatory Visit: Payer: Self-pay

## 2018-06-26 ENCOUNTER — Encounter (HOSPITAL_COMMUNITY): Payer: Self-pay | Admitting: *Deleted

## 2018-06-26 DIAGNOSIS — K852 Alcohol induced acute pancreatitis without necrosis or infection: Secondary | ICD-10-CM | POA: Diagnosis not present

## 2018-06-26 DIAGNOSIS — Z87891 Personal history of nicotine dependence: Secondary | ICD-10-CM | POA: Diagnosis not present

## 2018-06-26 DIAGNOSIS — R1013 Epigastric pain: Secondary | ICD-10-CM | POA: Diagnosis present

## 2018-06-26 DIAGNOSIS — Z79899 Other long term (current) drug therapy: Secondary | ICD-10-CM | POA: Insufficient documentation

## 2018-06-26 DIAGNOSIS — E876 Hypokalemia: Secondary | ICD-10-CM | POA: Insufficient documentation

## 2018-06-26 LAB — COMPREHENSIVE METABOLIC PANEL
ALT: 71 U/L — ABNORMAL HIGH (ref 0–44)
AST: 43 U/L — ABNORMAL HIGH (ref 15–41)
Albumin: 3.4 g/dL — ABNORMAL LOW (ref 3.5–5.0)
Alkaline Phosphatase: 78 U/L (ref 38–126)
Anion gap: 16 — ABNORMAL HIGH (ref 5–15)
BUN: 14 mg/dL (ref 6–20)
CO2: 21 mmol/L — ABNORMAL LOW (ref 22–32)
Calcium: 6.9 mg/dL — ABNORMAL LOW (ref 8.9–10.3)
Chloride: 93 mmol/L — ABNORMAL LOW (ref 98–111)
Creatinine, Ser: 0.7 mg/dL (ref 0.44–1.00)
GFR calc Af Amer: >60 mL/min (ref >60)
GFR calc non Af Amer: >60 mL/min (ref >60)
Glucose, Bld: 138 mg/dL — ABNORMAL HIGH (ref 70–99)
Potassium: 3.1 mmol/L — ABNORMAL LOW (ref 3.5–5.1)
Sodium: 130 mmol/L — ABNORMAL LOW (ref 135–145)
Total Bilirubin: 2.4 mg/dL — ABNORMAL HIGH (ref 0.3–1.2)
Total Protein: 6.9 g/dL (ref 6.5–8.1)

## 2018-06-26 LAB — CBC
HCT: 40 % (ref 36.0–46.0)
Hemoglobin: 13.9 g/dL (ref 12.0–15.0)
MCH: 30.3 pg (ref 26.0–34.0)
MCHC: 34.8 g/dL (ref 30.0–36.0)
MCV: 87.3 fL (ref 80.0–100.0)
Platelets: 132 10*3/uL — ABNORMAL LOW (ref 150–400)
RBC: 4.58 MIL/uL (ref 3.87–5.11)
RDW: 13.2 % (ref 11.5–15.5)
WBC: 10.7 10*3/uL — ABNORMAL HIGH (ref 4.0–10.5)
nRBC: 0 % (ref 0.0–0.2)

## 2018-06-26 LAB — I-STAT BETA HCG BLOOD, ED (MC, WL, AP ONLY): I-stat hCG, quantitative: <5 m[IU]/mL (ref <5)

## 2018-06-26 LAB — LIPASE, BLOOD: Lipase: 182 U/L — ABNORMAL HIGH (ref 11–51)

## 2018-06-26 MED ORDER — SODIUM CHLORIDE 0.9% FLUSH
3.0000 mL | Freq: Once | INTRAVENOUS | Status: AC
  Administered 2018-06-27: 3 mL via INTRAVENOUS

## 2018-06-26 NOTE — ED Triage Notes (Signed)
Pt in c/o abd pain with dx of Pancreatitis, pt reports not eating since Friday with intake of fluids causing abd pain, A&O x4, denies n/v/d, A&O x4

## 2018-06-27 ENCOUNTER — Other Ambulatory Visit: Payer: Self-pay

## 2018-06-27 LAB — URINALYSIS, ROUTINE W REFLEX MICROSCOPIC
Glucose, UA: 50 mg/dL — AB
Ketones, ur: 80 mg/dL — AB
Leukocytes,Ua: NEGATIVE
Nitrite: NEGATIVE
Protein, ur: >=300 mg/dL — AB
Specific Gravity, Urine: 1.032 — ABNORMAL HIGH (ref 1.005–1.030)
pH: 5 (ref 5.0–8.0)

## 2018-06-27 LAB — LACTATE DEHYDROGENASE: LDH: 276 U/L — ABNORMAL HIGH (ref 98–192)

## 2018-06-27 MED ORDER — POTASSIUM CHLORIDE CRYS ER 20 MEQ PO TBCR
40.0000 meq | EXTENDED_RELEASE_TABLET | Freq: Once | ORAL | Status: AC
  Administered 2018-06-27: 40 meq via ORAL
  Filled 2018-06-27: qty 2

## 2018-06-27 MED ORDER — ALUM & MAG HYDROXIDE-SIMETH 200-200-20 MG/5ML PO SUSP
30.0000 mL | Freq: Once | ORAL | Status: AC
  Administered 2018-06-27: 30 mL via ORAL
  Filled 2018-06-27: qty 30

## 2018-06-27 MED ORDER — ONDANSETRON 4 MG PO TBDP: 4.0000 mg | ORAL_TABLET | Freq: Three times a day (TID) | ORAL | 0 refills | Status: DC | PRN

## 2018-06-27 MED ORDER — METOCLOPRAMIDE HCL 5 MG/ML IJ SOLN
10.0000 mg | INTRAMUSCULAR | Status: AC
  Administered 2018-06-27: 10 mg via INTRAVENOUS
  Filled 2018-06-27: qty 2

## 2018-06-27 MED ORDER — HYDROMORPHONE HCL 1 MG/ML IJ SOLN
1.0000 mg | Freq: Once | INTRAMUSCULAR | Status: AC
  Administered 2018-06-27: 1 mg via INTRAVENOUS
  Filled 2018-06-27: qty 1

## 2018-06-27 MED ORDER — LIDOCAINE VISCOUS HCL 2 % MT SOLN
15.0000 mL | Freq: Once | OROMUCOSAL | Status: AC
  Administered 2018-06-27: 15 mL via ORAL
  Filled 2018-06-27: qty 15

## 2018-06-27 MED ORDER — SODIUM CHLORIDE 0.9 % IV BOLUS
2000.0000 mL | Freq: Once | INTRAVENOUS | Status: AC
  Administered 2018-06-27: 2000 mL via INTRAVENOUS

## 2018-06-27 MED ORDER — FAMOTIDINE IN NACL 20-0.9 MG/50ML-% IV SOLN
20.0000 mg | INTRAVENOUS | Status: AC
  Administered 2018-06-27: 20 mg via INTRAVENOUS
  Filled 2018-06-27: qty 50

## 2018-06-27 MED ORDER — IBUPROFEN 400 MG PO TABS
600.0000 mg | ORAL_TABLET | Freq: Once | ORAL | Status: AC
  Administered 2018-06-27: 600 mg via ORAL
  Filled 2018-06-27: qty 1

## 2018-06-27 MED ORDER — DICYCLOMINE HCL 10 MG PO CAPS
10.0000 mg | ORAL_CAPSULE | Freq: Once | ORAL | Status: AC
  Administered 2018-06-27: 10 mg via ORAL
  Filled 2018-06-27: qty 1

## 2018-06-27 MED ORDER — PANTOPRAZOLE SODIUM 20 MG PO TBEC: 20.0000 mg | DELAYED_RELEASE_TABLET | Freq: Every day | ORAL | 1 refills | Status: DC

## 2018-06-27 MED ORDER — SODIUM CHLORIDE 0.9 % IV BOLUS: 1000.0000 mL | Freq: Once | INTRAVENOUS | Status: DC

## 2018-06-27 MED ORDER — LACTATED RINGERS IV BOLUS
1000.0000 mL | Freq: Once | INTRAVENOUS | Status: AC
  Administered 2018-06-27: 1000 mL via INTRAVENOUS

## 2018-06-27 MED ORDER — HYDROCODONE-ACETAMINOPHEN 5-325 MG PO TABS: 1.0000 | ORAL_TABLET | Freq: Four times a day (QID) | ORAL | 0 refills | Status: DC | PRN

## 2018-06-27 NOTE — Discharge Instructions (Addendum)
Your symptoms are likely due to pancreatitis. Continue a clear liquid diet as outlined on your discharge instructions. Drink plenty of clear liquids to prevent dehydration. Discontinue use of alcohol as this will make your symptoms worse.  Take Norco as needed for pain control. Do not drive or drink alcohol after taking Norco as this medication can make you sleepy and impair your judgement. Use Zofran for management of nausea and vomiting. Take Protonix daily as prescribed. Follow up with your primary care doctor in 2 days for recheck. Return to the ED for any new or worsening symptoms.  ----  Sus sntomas probablemente se deban a pancreatitis. Contine con una dieta de lquidos claros como se describe en sus instrucciones de alta. Beba muchos lquidos claros para Environmental manager. Suspenda el uso de alcohol ya que esto empeorar sus sntomas.  Tome Norco segn sea necesario para Financial controller. No conduzca ni beba alcohol despus de tomar Norco, ya que este medicamento puede provocarle sueo y Print production planner su juicio. Use Zofran para controlar las nuseas y los vmitos. Tome Protonix diariamente segn lo prescrito. Haga un seguimiento con su mdico de atencin primaria en 2 das para volver a Musician. Regrese al servicio de urgencias por cualquier sntoma nuevo o que empeore.

## 2018-06-27 NOTE — ED Provider Notes (Addendum)
[facility] EMERGENCY DEPARTMENT Provider Note   CSN: [id number] Arrival date & time: 06/26/18  1801    History   Chief Complaint Chief Complaint  Patient presents with  . Abdominal Pain    HPI Kelsey Swanson is a 48 y.o. female.     48 year old female with a history of pancreatitis presents to the ED for complaints of abdominal pain.  She has been experiencing constant pain over the past 4 days which has been waxing and waning in severity.  Pain is primarily in her epigastrium.  It is nonradiating.  She tried taking tramadol for symptoms without relief.  Has had anorexia since onset of her pain with nausea and vomiting over the past 2 days.  She had a looser bowel movement earlier in the day yesterday.  No melena or hematochezia.  Denies hematemesis, fever, urinary symptoms.  Reports that her symptoms feel similar to pancreatitis from 1 year ago.  She did not drink alcohol for a year, but had 2 beers and 2 liquor drinks 2 weeks ago.    The history is provided by the patient. No language interpreter was used.  Abdominal Pain    Past Medical History:  Diagnosis Date  . Abnormal Pap smear 1996  . Fatty liver   . Pancreatitis, chronic [facility])     Patient Active Problem List   Diagnosis Date Noted  . Normocytic anemia 04/08/2017  . Acute alcoholic hepatitis 51/03/5850  . Thrombocytopenia ([place]) 04/07/2017  . Acute alcoholic pancreatitis 77/82/4235  . Fatty liver 07/23/2016  . Obesity (BMI 30.0-34.9) 07/23/2016  . Contraception 01/21/2016  . OBESITY, NOS 04/21/2006    Past Surgical History:  Procedure Laterality Date  . BREAST EXCISIONAL BIOPSY Right   . BREAST SURGERY     right breast  . ECTOPIC PREGNANCY SURGERY  2012     OB History    Gravida  5   Para  3   Term  3   Preterm      AB  2   Living  3     SAB  1   TAB      Ectopic  1   Multiple  0   Live Births  3            Home Medications    Prior to Admission  medications   Medication Sig Start Date End Date Taking? Authorizing Provider  Ascorbic Acid (VITAMIN C) 1000 MG tablet Take 1,000 mg by mouth daily.   Yes [provider]  Multiple Vitamins-Minerals (ALIVE ONCE DAILY WOMENS) TABS Take 1 tablet by mouth daily.   Yes [provider]  HYDROcodone-acetaminophen (NORCO/VICODIN) 5-325 MG tablet Take 1 tablet by mouth every 6 (six) hours as needed for severe pain. 06/27/18   Antonietta Breach, PA-C  ondansetron (ZOFRAN ODT) 4 MG disintegrating tablet Take 1 tablet (4 mg total) by mouth every 8 (eight) hours as needed for nausea or vomiting. 06/27/18   Antonietta Breach, PA-C    Family History Family History  Problem Relation Age of Onset  . Diabetes Father   . Diabetes Maternal Aunt   . Ovarian cancer Maternal Aunt   . Diabetes Maternal Grandmother   . Breast cancer Neg Hx     Social History Social History   Tobacco Use  . Smoking status: Former Smoker    Packs/day: 0.30    Years: 10.00    Pack years: 3.00    Types: Cigarettes    Last attempt to  quit: 06/07/2015    Years since quitting: 3.0  . Smokeless tobacco: Never Used  Substance Use Topics  . Alcohol use: Not Currently  . Drug use: No     Allergies   Patient has no known allergies.   Review of Systems Review of Systems  Gastrointestinal: Positive for abdominal pain.  Ten systems reviewed and are negative for acute change, except as noted in the HPI.    Physical Exam Updated Vital Signs BP 103/81   Pulse (!) 112   Temp 99.5 F (37.5 C) (Oral)   Resp 16   Ht 5\' 4"  (1.626 m)   Wt 86.2 kg   LMP 06/03/2018   SpO2 98%   Breastfeeding No   BMI 32.61 kg/m   Physical Exam Vitals signs and nursing note reviewed.  Constitutional:      General: She is not in acute distress.    Appearance: She is well-developed. She is not diaphoretic.     Comments: Nontoxic appearing and in NAD  HENT:     Head: Normocephalic and atraumatic.  Eyes:     General: No scleral  icterus.    Conjunctiva/sclera: Conjunctivae normal.  Neck:     Musculoskeletal: Normal range of motion.  Cardiovascular:     Rate and Rhythm: Normal rate and regular rhythm.     Pulses: Normal pulses.  Pulmonary:     Effort: Pulmonary effort is normal. No respiratory distress.     Breath sounds: No wheezing.     Comments: Respirations even and unlabored Abdominal:     Palpations: There is no mass.     Tenderness: There is abdominal tenderness (epigastric).     Hernia: No hernia is present.     Comments: Soft, nondistended abdomen with focal epigastric tenderness.  No peritoneal signs.  Musculoskeletal: Normal range of motion.  Skin:    General: Skin is warm and dry.     Coloration: Skin is not pale.     Findings: No erythema or rash.  Neurological:     General: No focal deficit present.     Mental Status: She is alert and oriented to person, place, and time.     Coordination: Coordination normal.  Psychiatric:        Behavior: Behavior normal.      ED Treatments / Results  Labs (all labs ordered are listed, but only abnormal results are displayed) Labs Reviewed  LIPASE, BLOOD - Abnormal; Notable for the following components:      Result Value   Lipase 182 (*)    All other components within normal limits  COMPREHENSIVE METABOLIC PANEL - Abnormal; Notable for the following components:   Sodium 130 (*)    Potassium 3.1 (*)    Chloride 93 (*)    CO2 21 (*)    Glucose, Bld 138 (*)    Calcium 6.9 (*)    Albumin 3.4 (*)    AST 43 (*)    ALT 71 (*)    Total Bilirubin 2.4 (*)    Anion gap 16 (*)    All other components within normal limits  CBC - Abnormal; Notable for the following components:   WBC 10.7 (*)    Platelets 132 (*)    All other components within normal limits  URINALYSIS, ROUTINE W REFLEX MICROSCOPIC - Abnormal; Notable for the following components:   Color, Urine AMBER (*)    APPearance CLOUDY (*)    Specific Gravity, Urine 1.032 (*)    Glucose, UA  50 (*)    Hgb urine dipstick MODERATE (*)    Bilirubin Urine SMALL (*)    Ketones, ur 80 (*)    Protein, ur >=300 (*)    Bacteria, UA FEW (*)    All other components within normal limits  LACTATE DEHYDROGENASE - Abnormal; Notable for the following components:   LDH 276 (*)    All other components within normal limits  I-STAT BETA HCG BLOOD, ED ([facility], [facility], [facility] ONLY)    EKG None  Radiology No results found.  Procedures Procedures (including critical care time)  Medications Ordered in ED Medications  potassium chloride SA (K-DUR) CR tablet 40 mEq (has no administration in time range)  ibuprofen (ADVIL) tablet 600 mg (has no administration in time range)  sodium chloride flush (NS) 0.9 % injection 3 mL (3 mLs Intravenous Given 06/27/18 0026)  metoCLOPramide (REGLAN) injection 10 mg (10 mg Intravenous Given 06/27/18 0050)  HYDROmorphone (DILAUDID) injection 1 mg (1 mg Intravenous Given 06/27/18 0053)  famotidine (PEPCID) IVPB 20 mg premix (0 mg Intravenous Stopped 06/27/18 0156)  sodium chloride 0.9 % bolus 2,000 mL (0 mLs Intravenous Stopped 06/27/18 0238)  alum & mag hydroxide-simeth (MAALOX/MYLANTA) 200-200-20 MG/5ML suspension 30 mL (30 mLs Oral Given 06/27/18 0158)    And  lidocaine (XYLOCAINE) 2 % viscous mouth solution 15 mL (15 mLs Oral Given 06/27/18 0158)  dicyclomine (BENTYL) capsule 10 mg (10 mg Oral Given 06/27/18 0158)  lactated ringers bolus 1,000 mL (0 mLs Intravenous Stopped 06/27/18 0339)     Initial Impression / Assessment and Plan / ED Course  I have reviewed the triage vital signs and the nursing notes.  Pertinent labs & imaging results that were available during my care of the patient were reviewed by me and considered in my medical decision making (see chart for details).        98:32 AM 48 year old female presents to the emergency department for epigastric abdominal pain which has been present x4 days.  Symptoms have been associated with nausea and vomiting.  She reports  similar pain with past episodes of ETOH induced pancreatitis.  Had quit drinking alcohol for 1 year, but resumed alcohol consumption 2 weeks ago.  Noted to be focally tender in the epigastrium without peritoneal signs.  Hx of negative RUQ ultrasound 1 year ago.  Also no gallbladder etiology on MRCP in 2018 in the setting of similar pain.  Labs obtained in triage notable for very mild leukocytosis.  Slight LFT elevation with elevated lipase.  Findings consistent with acute pancreatitis.  She has had some improvement in her pain since receiving IV Dilaudid.  Reports resolution of her nausea with Reglan.  Plan to hydrate with 2 L IV fluids.  Pending urinalysis.  Will reassess.  2:15 AM Lactated Ringer's bolus ordered for persistent tachycardia.  Pain has improved further following GI cocktail, Bentyl.  3:37 AM Patient persistently tachycardic despite 2L NS and 1L LR. Her Ranson score is 0 which is reassuring. UA suggestive of dehydration.  3:44 AM On repeat assessment, patient is resting comfortably.  HR 105bpm.  She has been tolerating oral fluids.  She states that her pain is significantly improved.  She has a generally nontender repeat abdominal exam; improved.  Patient expresses comfort with discharge and follow-up with her primary care doctor.  I believe this is reasonable.  She does not meet criteria for SIRS/Sepsis.  Will maintain on clear liquid diet until symptoms resolved.  She has been instructed to refrain from alcohol  use.  Return precautions discussed and provided. Patient discharged in stable condition with no unaddressed concerns.   Final Clinical Impressions(s) / ED Diagnoses   Final diagnoses:  Alcohol-induced acute pancreatitis, unspecified complication status  Hypokalemia  Hypocalcemia    ED Discharge Orders         Ordered    HYDROcodone-acetaminophen (NORCO/VICODIN) 5-325 MG tablet  Every 6 hours PRN     06/27/18 0347    ondansetron (ZOFRAN ODT) 4 MG disintegrating tablet   Every 8 hours PRN     06/27/18 0347           Antonietta Breach, PA-C 06/27/18 0436    Antonietta Breach, PA-C 06/27/18 5883    Ezequiel Essex, MD 06/27/18 [phone]

## 2018-07-01 ENCOUNTER — Encounter (HOSPITAL_COMMUNITY): Payer: Self-pay | Admitting: Emergency Medicine

## 2018-07-01 ENCOUNTER — Other Ambulatory Visit: Payer: Self-pay

## 2018-07-01 ENCOUNTER — Inpatient Hospital Stay (HOSPITAL_COMMUNITY)
Admission: EM | Admit: 2018-07-01 | Discharge: 2018-07-03 | DRG: 440 | Disposition: A | Discharge status: Home / Self Care | Payer: Medicaid Other | Attending: Internal Medicine | Admitting: Internal Medicine

## 2018-07-01 ENCOUNTER — Emergency Department (HOSPITAL_COMMUNITY): Payer: Medicaid Other

## 2018-07-01 DIAGNOSIS — K859 Acute pancreatitis without necrosis or infection, unspecified: Secondary | ICD-10-CM | POA: Diagnosis present

## 2018-07-01 DIAGNOSIS — Z87891 Personal history of nicotine dependence: Secondary | ICD-10-CM

## 2018-07-01 DIAGNOSIS — K861 Other chronic pancreatitis: Secondary | ICD-10-CM

## 2018-07-01 DIAGNOSIS — F1021 Alcohol dependence, in remission: Secondary | ICD-10-CM | POA: Diagnosis not present

## 2018-07-01 DIAGNOSIS — Z79899 Other long term (current) drug therapy: Secondary | ICD-10-CM

## 2018-07-01 DIAGNOSIS — F329 Major depressive disorder, single episode, unspecified: Secondary | ICD-10-CM | POA: Diagnosis not present

## 2018-07-01 DIAGNOSIS — E860 Dehydration: Secondary | ICD-10-CM | POA: Diagnosis present

## 2018-07-01 DIAGNOSIS — Z03818 Encounter for observation for suspected exposure to other biological agents ruled out: Secondary | ICD-10-CM | POA: Diagnosis not present

## 2018-07-01 DIAGNOSIS — K760 Fatty (change of) liver, not elsewhere classified: Secondary | ICD-10-CM | POA: Diagnosis not present

## 2018-07-01 DIAGNOSIS — F32A Depression, unspecified: Secondary | ICD-10-CM | POA: Diagnosis present

## 2018-07-01 DIAGNOSIS — K852 Alcohol induced acute pancreatitis without necrosis or infection: Principal | ICD-10-CM | POA: Diagnosis present

## 2018-07-01 DIAGNOSIS — Z1159 Encounter for screening for other viral diseases: Secondary | ICD-10-CM

## 2018-07-01 LAB — COMPREHENSIVE METABOLIC PANEL
ALT: 56 U/L — ABNORMAL HIGH (ref 0–44)
AST: 32 U/L (ref 15–41)
Albumin: 3.4 g/dL — ABNORMAL LOW (ref 3.5–5.0)
Alkaline Phosphatase: 103 U/L (ref 38–126)
Anion gap: 14 (ref 5–15)
BUN: 6 mg/dL (ref 6–20)
CO2: 26 mmol/L (ref 22–32)
Calcium: 9.2 mg/dL (ref 8.9–10.3)
Chloride: 95 mmol/L — ABNORMAL LOW (ref 98–111)
Creatinine, Ser: 0.57 mg/dL (ref 0.44–1.00)
GFR calc Af Amer: >60 mL/min (ref >60)
GFR calc non Af Amer: >60 mL/min (ref >60)
Glucose, Bld: 102 mg/dL — ABNORMAL HIGH (ref 70–99)
Potassium: 3.3 mmol/L — ABNORMAL LOW (ref 3.5–5.1)
Sodium: 135 mmol/L (ref 135–145)
Total Bilirubin: 1.1 mg/dL (ref 0.3–1.2)
Total Protein: 7.1 g/dL (ref 6.5–8.1)

## 2018-07-01 LAB — URINALYSIS, ROUTINE W REFLEX MICROSCOPIC
Bacteria, UA: NONE SEEN
Bilirubin Urine: NEGATIVE
Glucose, UA: NEGATIVE mg/dL
Ketones, ur: 80 mg/dL — AB
Leukocytes,Ua: NEGATIVE
Nitrite: NEGATIVE
Protein, ur: NEGATIVE mg/dL
Specific Gravity, Urine: 1.012 (ref 1.005–1.030)
pH: 6 (ref 5.0–8.0)

## 2018-07-01 LAB — CBC
HCT: 34.2 % — ABNORMAL LOW (ref 36.0–46.0)
Hemoglobin: 11.4 g/dL — ABNORMAL LOW (ref 12.0–15.0)
MCH: 30.6 pg (ref 26.0–34.0)
MCHC: 33.3 g/dL (ref 30.0–36.0)
MCV: 91.7 fL (ref 80.0–100.0)
Platelets: 261 10*3/uL (ref 150–400)
RBC: 3.73 MIL/uL — ABNORMAL LOW (ref 3.87–5.11)
RDW: 13.4 % (ref 11.5–15.5)
WBC: 8.8 10*3/uL (ref 4.0–10.5)
nRBC: 0 % (ref 0.0–0.2)

## 2018-07-01 LAB — I-STAT BETA HCG BLOOD, ED (MC, WL, AP ONLY): I-stat hCG, quantitative: <5 m[IU]/mL (ref <5)

## 2018-07-01 LAB — SARS CORONAVIRUS 2 BY RT PCR (HOSPITAL ORDER, PERFORMED IN ~~LOC~~ HOSPITAL LAB): SARS Coronavirus 2: NEGATIVE

## 2018-07-01 LAB — LIPASE, BLOOD: Lipase: 186 U/L — ABNORMAL HIGH (ref 11–51)

## 2018-07-01 MED ORDER — ACETAMINOPHEN 325 MG PO TABS: 650.0000 mg | ORAL_TABLET | Freq: Four times a day (QID) | ORAL | Status: DC | PRN

## 2018-07-01 MED ORDER — LACTATED RINGERS IV SOLN
INTRAVENOUS | Status: DC
  Administered 2018-07-01 (×2): via INTRAVENOUS
  Administered 2018-07-02: 200 mL/h via INTRAVENOUS
  Administered 2018-07-02 (×2): via INTRAVENOUS
  Administered 2018-07-03: 200 mL/h via INTRAVENOUS

## 2018-07-01 MED ORDER — DICYCLOMINE HCL 10 MG PO CAPS
10.0000 mg | ORAL_CAPSULE | Freq: Once | ORAL | Status: AC
  Administered 2018-07-01: 05:00:00 10 mg via ORAL
  Filled 2018-07-01: qty 1

## 2018-07-01 MED ORDER — IOHEXOL 300 MG/ML  SOLN
100.0000 mL | Freq: Once | INTRAMUSCULAR | Status: AC | PRN
  Administered 2018-07-01: 100 mL via INTRAVENOUS

## 2018-07-01 MED ORDER — KETOROLAC TROMETHAMINE 30 MG/ML IJ SOLN
30.0000 mg | Freq: Four times a day (QID) | INTRAMUSCULAR | Status: DC | PRN
  Administered 2018-07-01 – 2018-07-03 (×8): 30 mg via INTRAVENOUS
  Filled 2018-07-01 (×8): qty 1

## 2018-07-01 MED ORDER — ENOXAPARIN SODIUM 40 MG/0.4ML ~~LOC~~ SOLN
40.0000 mg | SUBCUTANEOUS | Status: DC
  Administered 2018-07-01 – 2018-07-03 (×3): 40 mg via SUBCUTANEOUS
  Filled 2018-07-01 (×3): qty 0.4

## 2018-07-01 MED ORDER — VITAMIN B-1 100 MG PO TABS
100.0000 mg | ORAL_TABLET | Freq: Every day | ORAL | Status: DC
  Administered 2018-07-02 – 2018-07-03 (×2): 100 mg via ORAL
  Filled 2018-07-01 (×2): qty 1

## 2018-07-01 MED ORDER — ADULT MULTIVITAMIN W/MINERALS CH
1.0000 | ORAL_TABLET | Freq: Every day | ORAL | Status: DC
  Administered 2018-07-02 – 2018-07-03 (×2): 1 via ORAL
  Filled 2018-07-01 (×2): qty 1

## 2018-07-01 MED ORDER — ONDANSETRON HCL 4 MG PO TABS: 4.0000 mg | ORAL_TABLET | Freq: Four times a day (QID) | ORAL | Status: DC | PRN

## 2018-07-01 MED ORDER — FOLIC ACID 1 MG PO TABS
1.0000 mg | ORAL_TABLET | Freq: Every day | ORAL | Status: DC
  Administered 2018-07-02 – 2018-07-03 (×2): 1 mg via ORAL
  Filled 2018-07-01 (×2): qty 1

## 2018-07-01 MED ORDER — SODIUM CHLORIDE 0.9 % IV BOLUS
2000.0000 mL | Freq: Once | INTRAVENOUS | Status: AC
  Administered 2018-07-01: 2000 mL via INTRAVENOUS

## 2018-07-01 MED ORDER — THIAMINE HCL 100 MG/ML IJ SOLN
Freq: Once | INTRAVENOUS | Status: AC
  Administered 2018-07-01: 10:00:00 via INTRAVENOUS
  Filled 2018-07-01: qty 1000

## 2018-07-01 MED ORDER — ACETAMINOPHEN 650 MG RE SUPP: 650.0000 mg | Freq: Four times a day (QID) | RECTAL | Status: DC | PRN

## 2018-07-01 MED ORDER — MORPHINE SULFATE (PF) 4 MG/ML IV SOLN
4.0000 mg | Freq: Once | INTRAVENOUS | Status: AC
  Administered 2018-07-01: 4 mg via INTRAVENOUS
  Filled 2018-07-01: qty 1

## 2018-07-01 MED ORDER — SODIUM CHLORIDE 0.9 % IV BOLUS: 1000.0000 mL | Freq: Once | INTRAVENOUS | Status: DC

## 2018-07-01 MED ORDER — KETOROLAC TROMETHAMINE 15 MG/ML IJ SOLN
15.0000 mg | Freq: Once | INTRAMUSCULAR | Status: AC
  Administered 2018-07-01: 15 mg via INTRAVENOUS
  Filled 2018-07-01: qty 1

## 2018-07-01 MED ORDER — ONDANSETRON HCL 4 MG/2ML IJ SOLN
4.0000 mg | Freq: Once | INTRAMUSCULAR | Status: AC
  Administered 2018-07-01: 4 mg via INTRAVENOUS
  Filled 2018-07-01: qty 2

## 2018-07-01 MED ORDER — ONDANSETRON HCL 4 MG/2ML IJ SOLN
4.0000 mg | Freq: Four times a day (QID) | INTRAMUSCULAR | Status: DC | PRN
  Administered 2018-07-01 – 2018-07-03 (×3): 4 mg via INTRAVENOUS
  Filled 2018-07-01 (×2): qty 2

## 2018-07-01 NOTE — ED Triage Notes (Signed)
Pt in with LLQ pain, states it radiates to L flank. Seen for same earlier in week, dx with pancreatitis and left AMA d/t lack of childcare. Denies any n/v/d, states she has maintained clear liq diet, she is now out of her stomach meds and pain is uncontrolled

## 2018-07-01 NOTE — ED Notes (Signed)
Pt complains of LUQ pain. Reports hx of pancreatitis and states current pain feels similar. Pt denies nausea,vomiting, diarrhea or fever. She states she has tried to make an appointment with primary care but has not been able to.

## 2018-07-01 NOTE — ED Notes (Signed)
ED TO INPATIENT HANDOFF REPORT  ED Nurse Name and Phone #: Percell Locus, RN   S Name/Age/Gender Kelsey Swanson 48 y.o. female Room/Bed: 030C/030C  Code Status   Code Status: Prior  Home/SNF/Other Home Patient oriented to: self, place, time and situation Is this baseline? Yes   Triage Complete: Triage complete  Chief Complaint LEFT SIDE PAIN  Triage Note Pt in with LLQ pain, states it radiates to L flank. Seen for same earlier in week, dx with pancreatitis and left AMA d/t lack of childcare. Denies any n/v/d, states she has maintained clear liq diet, she is now out of her stomach meds and pain is uncontrolled   Allergies No Known Allergies  Level of Care/Admitting Diagnosis ED Disposition    ED Disposition Condition [place]: [facility] [100100]  Level of Care: Telemetry Medical [104]  I expect the patient will be discharged within 24 hours: No (not a candidate for 5C-Observation unit)  Covid Evaluation: N/A  Diagnosis: Acute pancreatitis [577.0.ICD-9-CM]  Admitting Physician: Rise Patience [phone]  Attending Physician: Rise Patience [phone]  PT Class (Do Not Modify): Observation [104]  PT Acc Code (Do Not Modify): Observation [10022]       B Medical/Surgery History Past Medical History:  Diagnosis Date  . Abnormal Pap smear 1996  . Fatty liver   . Pancreatitis, chronic ([place])    Past Surgical History:  Procedure Laterality Date  . BREAST EXCISIONAL BIOPSY Right   . BREAST SURGERY     right breast  . ECTOPIC PREGNANCY SURGERY  2012     A IV Location/Drains/Wounds Patient Lines/Drains/Airways Status   Active Line/Drains/Airways    Name:   Placement date:   Placement time:   Site:   Days:   Peripheral IV 07/01/18 Right Antecubital   07/01/18    0419    Antecubital   less than 1          Intake/Output Last 24 hours No intake or output data in the 24 hours ending 07/01/18 4782  Labs/Imaging Results  for orders placed or performed during the hospital encounter of 07/01/18 (from the past 48 hour(s))  Lipase, blood     Status: Abnormal   Collection Time: 07/01/18  2:33 AM  Result Value Ref Range   Lipase 186 (H) 11 - 51 U/L    Comment: Performed at [facility] [address], [place], [place] [address]  Comprehensive metabolic panel     Status: Abnormal   Collection Time: 07/01/18  2:33 AM  Result Value Ref Range   Sodium 135 135 - 145 mmol/L   Potassium 3.3 (L) 3.5 - 5.1 mmol/L   Chloride 95 (L) 98 - 111 mmol/L   CO2 26 22 - 32 mmol/L   Glucose, Bld 102 (H) 70 - 99 mg/dL   BUN 6 6 - 20 mg/dL   Creatinine, Ser 0.57 0.44 - 1.00 mg/dL   Calcium 9.2 8.9 - 10.3 mg/dL   Total Protein 7.1 6.5 - 8.1 g/dL   Albumin 3.4 (L) 3.5 - 5.0 g/dL   AST 32 15 - 41 U/L   ALT 56 (H) 0 - 44 U/L   Alkaline Phosphatase 103 38 - 126 U/L   Total Bilirubin 1.1 0.3 - 1.2 mg/dL   GFR calc non Af Amer >60 >60 mL/min   GFR calc Af Amer >60 >60 mL/min   Anion gap 14 5 - 15    Comment: Performed at  [facility], [place] [address] [place], [place] [address]  CBC     Status: Abnormal   Collection Time: 07/01/18  2:33 AM  Result Value Ref Range   WBC 8.8 4.0 - 10.5 K/uL   RBC 3.73 (L) 3.87 - 5.11 MIL/uL   Hemoglobin 11.4 (L) 12.0 - 15.0 g/dL   HCT 34.2 (L) 36.0 - 46.0 %   MCV 91.7 80.0 - 100.0 fL   MCH 30.6 26.0 - 34.0 pg   MCHC 33.3 30.0 - 36.0 g/dL   RDW 13.4 11.5 - 15.5 %   Platelets 261 150 - 400 K/uL   nRBC 0.0 0.0 - 0.2 %    Comment: Performed at [facility], [place] [address], [place], [place] [address]  Urinalysis, Routine w reflex microscopic     Status: Abnormal   Collection Time: 07/01/18  2:36 AM  Result Value Ref Range   Color, Urine YELLOW YELLOW   APPearance CLEAR CLEAR   Specific Gravity, Urine 1.012 1.005 - 1.030   pH 6.0 5.0 - 8.0   Glucose, UA NEGATIVE NEGATIVE mg/dL   Hgb urine dipstick MODERATE (A) NEGATIVE   Bilirubin Urine NEGATIVE NEGATIVE   Ketones, ur 80  (A) NEGATIVE mg/dL   Protein, ur NEGATIVE NEGATIVE mg/dL   Nitrite NEGATIVE NEGATIVE   Leukocytes,Ua NEGATIVE NEGATIVE   RBC / HPF 11-20 0 - 5 RBC/hpf   WBC, UA 6-10 0 - 5 WBC/hpf   Bacteria, UA NONE SEEN NONE SEEN   Squamous Epithelial / LPF 0-5 0 - 5   Mucus PRESENT     Comment: Performed at [facility], [place] [address] [place], [place] [address]  I-Stat beta hCG blood, ED     Status: None   Collection Time: 07/01/18  4:07 AM  Result Value Ref Range   I-stat hCG, quantitative <5.0 <5 mIU/mL   Comment 3            Comment:   GEST. AGE      CONC.  (mIU/mL)   <=1 WEEK        5 - 50     2 WEEKS       50 - 500     3 WEEKS       100 - 10,000     4 WEEKS     1,000 - 30,000        FEMALE AND NON-PREGNANT FEMALE:     LESS THAN 5 mIU/mL    Ct Abdomen Pelvis W Contrast  Result Date: 07/01/2018 CLINICAL DATA:  Pt c/o c/o periumbilical pain. H/o pancreatitis, chronic; fatty liver EXAM: CT ABDOMEN AND PELVIS WITH CONTRAST TECHNIQUE: Multidetector CT imaging of the abdomen and pelvis was performed using the standard protocol following bolus administration of intravenous contrast. CONTRAST:  141mL OMNIPAQUE IOHEXOL 300 MG/ML  SOLN COMPARISON:  CT 04/07/2017 FINDINGS: Lower chest: Lung bases are clear. Hepatobiliary: Diffuse low-attenuation liver. No biliary duct dilatation. No gallstones identified. Common bile duct normal caliber. Pancreas: The pancreas enhances uniformly. No duct dilatation. Extensive fluid surrounds the pancreatic head body and tail extends along the greater curvature the stomach as well as along the anterior margin of the LEFT and RIGHT pararenal fascia. Findings consistent acute pancreatitis. On delayed imaging (series 8) there is thin enhancing rim to the fluid collection along the greater curvature the stomach (image 4/8). No evidence of vascular complication associated with pancreatitis. Spleen: Normal spleen Adrenals/urinary tract: Adrenal glands and kidneys are normal. The  ureters and bladder normal. Stomach/Bowel: Stomach, duodenum, small-bowel cecum normal. Appendix not identified. The colon and rectosigmoid colon are normal. Vascular/Lymphatic: Abdominal aorta is normal caliber with atherosclerotic calcification. There is no retroperitoneal or periportal lymphadenopathy. No pelvic lymphadenopathy. Reproductive: Uterus and necks are normal Other: No intraperitoneal free air Musculoskeletal: No aggressive osseous lesion. IMPRESSION: 1. Acute pancreatitis with extensive fluid surrounding the head, body and tail the pancreas. Fluid extends along the anterior pararenal spaces and along the greater curvature of the stomach. Findings similar to CT 04/07/2017. 2. Early organization of the fluid collection along the greater curvature the stomach. 3. No evidence of pancreatic necrosis. 4. Hepatic steatosis.  No gallstones identified. Electronically Signed   By: Suzy Bouchard M.D.   On: 07/01/2018 05:59    Pending Labs Unresulted Labs (From admission, onward)    Start     Ordered   07/01/18 0825  SARS Coronavirus 2 (CEPHEID - Performed in [facility] hospital lab), Hosp Order  (Asymptomatic Patients Labs)  Once,   R    Question:  Rule Out  Answer:  Yes   07/01/18 0825   07/01/18 0504  Urine culture  ONCE - STAT,   STAT     07/01/18 0505          Vitals/Pain Today's Vitals   07/01/18 0636 07/01/18 0715 07/01/18 0721 07/01/18 0722  BP:  125/77    Pulse:  83    Resp:  19    Temp:      TempSrc:      SpO2:  98%    Weight:      Height:      PainSc: 8   4  4      Isolation Precautions No active isolations  Medications Medications  ketorolac (TORADOL) 15 MG/ML injection 15 mg (15 mg Intravenous Given 07/01/18 0515)  ondansetron (ZOFRAN) injection 4 mg (4 mg Intravenous Given 07/01/18 0515)  dicyclomine (BENTYL) capsule 10 mg (10 mg Oral Given 07/01/18 0515)  iohexol (OMNIPAQUE) 300 MG/ML solution 100 mL (100 mLs Intravenous Contrast Given 07/01/18 0531)  sodium  chloride 0.9 % bolus 2,000 mL (2,000 mLs Intravenous New Bag/Given 07/01/18 0629)  morphine 4 MG/ML injection 4 mg (4 mg Intravenous Given 07/01/18 1173)    Mobility walks Low fall risk      R Recommendations: See Admitting Provider Note  Report given to:   Additional Notes:

## 2018-07-01 NOTE — ED Provider Notes (Signed)
[facility] EMERGENCY DEPARTMENT Provider Note   CSN: [id number] Arrival date & time: 07/01/18  0316    History   Chief Complaint Chief Complaint  Patient presents with   Abdominal Pain   Flank Pain    HPI Kelsey Swanson is a 48 y.o. female.     HPI  Pt is a 48 y/o female with a h/o chronic pancreatitis who presents to the ED today for eval of abd pain that began 8 days ago. She states she was seen in the ED for eval of abd pain and was dx with pancreatitis.  Was DC'd with Zofran and pain medications and has adhered to a liquid diet.Marland Kitchen She was supposed to f/u with her pcp but she was unable to get an appointment yet. She states she continues to have pain to the epigastric area of her abdomen and on the left side of the abdomen. Pain radiates to the left flank.  States she took pain meds and it helped her sxs but she is about to run out.  She does not feel like her pain is well controlled with these medications.  Pain rated 6/10.  States pain is burning. No NV. No diarrhea or constipation. Is still passing gas. Last BM today. No fevers. No dysuria, urgency. Has dark urine.  States she has not drank alcohol in several weeks.  Past Medical History:  Diagnosis Date   Abnormal Pap smear 1996   Fatty liver    Pancreatitis, chronic ([place])     Patient Active Problem List   Diagnosis Date Noted   Acute pancreatitis 07/01/2018   Normocytic anemia 94/49/6759   Acute alcoholic hepatitis 16/38/4665   Thrombocytopenia ([place]) 99/35/7017   Acute alcoholic pancreatitis 79/39/0300   Fatty liver 07/23/2016   Obesity (BMI 30.0-34.9) 07/23/2016   Contraception 01/21/2016   OBESITY, NOS 04/21/2006    Past Surgical History:  Procedure Laterality Date   BREAST EXCISIONAL BIOPSY Right    BREAST SURGERY     right breast   ECTOPIC PREGNANCY SURGERY  2012     OB History    Gravida  5   Para  3   Term  3   Preterm      AB  2   Living  3     SAB   1   TAB      Ectopic  1   Multiple  0   Live Births  3            Home Medications    Prior to Admission medications   Medication Sig Start Date End Date Taking? Authorizing Provider  Ascorbic Acid (VITAMIN C) 1000 MG tablet Take 1,000 mg by mouth daily.   Yes [provider]  HYDROcodone-acetaminophen (NORCO/VICODIN) 5-325 MG tablet Take 1 tablet by mouth every 6 (six) hours as needed for severe pain. 06/27/18  Yes Antonietta Breach, PA-C  Multiple Vitamins-Minerals (ALIVE ONCE DAILY WOMENS) TABS Take 1 tablet by mouth daily.   Yes [provider]  ondansetron (ZOFRAN ODT) 4 MG disintegrating tablet Take 1 tablet (4 mg total) by mouth every 8 (eight) hours as needed for nausea or vomiting. 06/27/18  Yes Antonietta Breach, PA-C  pantoprazole (PROTONIX) 20 MG tablet Take 1 tablet (20 mg total) by mouth daily. 06/27/18  Yes Antonietta Breach, PA-C    Family History Family History  Problem Relation Age of Onset   Diabetes Father    Diabetes Maternal Aunt    Ovarian cancer Maternal  Aunt    Diabetes Maternal Grandmother    Breast cancer Neg Hx     Social History Social History   Tobacco Use   Smoking status: Former Smoker    Packs/day: 0.30    Years: 10.00    Pack years: 3.00    Types: Cigarettes    Last attempt to quit: 06/07/2015    Years since quitting: 3.0   Smokeless tobacco: Never Used  Substance Use Topics   Alcohol use: Not Currently   Drug use: No     Allergies   Patient has no known allergies.   Review of Systems Review of Systems  Constitutional: Negative for chills and fever.  HENT: Negative for ear pain and sore throat.   Eyes: Negative for visual disturbance.  Respiratory: Negative for cough and shortness of breath.   Cardiovascular: Negative for chest pain.  Gastrointestinal: Positive for abdominal pain. Negative for constipation, diarrhea, nausea and vomiting.  Genitourinary: Negative for dysuria and hematuria.  Musculoskeletal:  Negative for back pain.  Skin: Negative for rash.  Neurological: Negative for headaches.  All other systems reviewed and are negative.    Physical Exam Updated Vital Signs BP 125/77    Pulse 83    Temp 97.9 F (36.6 C) (Oral)    Resp 19    Ht 5\' 4"  (1.626 m)    Wt 86.2 kg    LMP 06/03/2018    SpO2 98%    BMI 32.62 kg/m   Physical Exam Vitals signs and nursing note reviewed.  Constitutional:      General: She is not in acute distress.    Appearance: She is well-developed.  HENT:     Head: Normocephalic and atraumatic.  Eyes:     Conjunctiva/sclera: Conjunctivae normal.  Neck:     Musculoskeletal: Neck supple.  Cardiovascular:     Rate and Rhythm: Normal rate and regular rhythm.     Heart sounds: No murmur.  Pulmonary:     Effort: Pulmonary effort is normal. No respiratory distress.     Breath sounds: Normal breath sounds.  Abdominal:     Palpations: Abdomen is soft.     Comments: Diffuse abd ttp, most prominent in the LUQ with voluntary guarding present. No rebound tenderness. No rigidity. Normal bowel sounds. Left CVA TTP  Skin:    General: Skin is warm and dry.  Neurological:     Mental Status: She is alert.     ED Treatments / Results  Labs (all labs ordered are listed, but only abnormal results are displayed) Labs Reviewed  LIPASE, BLOOD - Abnormal; Notable for the following components:      Result Value   Lipase 186 (*)    All other components within normal limits  COMPREHENSIVE METABOLIC PANEL - Abnormal; Notable for the following components:   Potassium 3.3 (*)    Chloride 95 (*)    Glucose, Bld 102 (*)    Albumin 3.4 (*)    ALT 56 (*)    All other components within normal limits  CBC - Abnormal; Notable for the following components:   RBC 3.73 (*)    Hemoglobin 11.4 (*)    HCT 34.2 (*)    All other components within normal limits  URINALYSIS, ROUTINE W REFLEX MICROSCOPIC - Abnormal; Notable for the following components:   Hgb urine dipstick MODERATE  (*)    Ketones, ur 80 (*)    All other components within normal limits  URINE CULTURE  I-STAT BETA HCG BLOOD,  ED ([facility], [facility], [facility] ONLY)    EKG None  Radiology Ct Abdomen Pelvis W Contrast  Result Date: 07/01/2018 CLINICAL DATA:  Pt c/o c/o periumbilical pain. H/o pancreatitis, chronic; fatty liver EXAM: CT ABDOMEN AND PELVIS WITH CONTRAST TECHNIQUE: Multidetector CT imaging of the abdomen and pelvis was performed using the standard protocol following bolus administration of intravenous contrast. CONTRAST:  136mL OMNIPAQUE IOHEXOL 300 MG/ML  SOLN COMPARISON:  CT 04/07/2017 FINDINGS: Lower chest: Lung bases are clear. Hepatobiliary: Diffuse low-attenuation liver. No biliary duct dilatation. No gallstones identified. Common bile duct normal caliber. Pancreas: The pancreas enhances uniformly. No duct dilatation. Extensive fluid surrounds the pancreatic head body and tail extends along the greater curvature the stomach as well as along the anterior margin of the LEFT and RIGHT pararenal fascia. Findings consistent acute pancreatitis. On delayed imaging (series 8) there is thin enhancing rim to the fluid collection along the greater curvature the stomach (image 4/8). No evidence of vascular complication associated with pancreatitis. Spleen: Normal spleen Adrenals/urinary tract: Adrenal glands and kidneys are normal. The ureters and bladder normal. Stomach/Bowel: Stomach, duodenum, small-bowel cecum normal. Appendix not identified. The colon and rectosigmoid colon are normal. Vascular/Lymphatic: Abdominal aorta is normal caliber with atherosclerotic calcification. There is no retroperitoneal or periportal lymphadenopathy. No pelvic lymphadenopathy. Reproductive: Uterus and necks are normal Other: No intraperitoneal free air Musculoskeletal: No aggressive osseous lesion. IMPRESSION: 1. Acute pancreatitis with extensive fluid surrounding the head, body and tail the pancreas. Fluid extends along the anterior pararenal  spaces and along the greater curvature of the stomach. Findings similar to CT 04/07/2017. 2. Early organization of the fluid collection along the greater curvature the stomach. 3. No evidence of pancreatic necrosis. 4. Hepatic steatosis.  No gallstones identified. Electronically Signed   By: Suzy Bouchard M.D.   On: 07/01/2018 05:59    Procedures Procedures (including critical care time)  Medications Ordered in ED Medications  ketorolac (TORADOL) 15 MG/ML injection 15 mg (15 mg Intravenous Given 07/01/18 0515)  ondansetron (ZOFRAN) injection 4 mg (4 mg Intravenous Given 07/01/18 0515)  dicyclomine (BENTYL) capsule 10 mg (10 mg Oral Given 07/01/18 0515)  iohexol (OMNIPAQUE) 300 MG/ML solution 100 mL (100 mLs Intravenous Contrast Given 07/01/18 0531)  sodium chloride 0.9 % bolus 2,000 mL (2,000 mLs Intravenous New Bag/Given 07/01/18 0629)  morphine 4 MG/ML injection 4 mg (4 mg Intravenous Given 07/01/18 2536)     Initial Impression / Assessment and Plan / ED Course  I have reviewed the triage vital signs and the nursing notes.  Pertinent labs & imaging results that were available during my care of the patient were reviewed by me and considered in my medical decision making (see chart for details).    Final Clinical Impressions(s) / ED Diagnoses   Final diagnoses:  Chronic pancreatitis, unspecified pancreatitis type [facility])   Patient presented emergency department today for evaluation of abdominal pain.  Has history of chronic pancreatitis and recently diagnosed with pancreatitis and discharged on pain medications and Zofran.  Was offered admission at that time however she declined.  Presents today with persistent pain despite taking pain medications at home.  Does have some tenderness to the abdomen.  Vital signs are reassuring.  Patient given medications and IV fluids in the ED.  CBC is without leukocytosis.  CMP with mild electrolyte derangement consistent with dehydration.  Normal liver and kidney  function.  Lipase is elevated, consistent with prior.  UA shows ketonuria suggesting dehydration and decreased p.o. intake.  Given patient's  return visits despite attempted outpatient management of symptoms, will plan for admission for further treatment of her pancreatitis.  Case discussed with Dr. Hal Hope who accepts patient for admission.  Discussed case with Dr. Leonides Schanz who recommends admission.   ED Discharge Orders    None       Rodney Booze, [place] 07/01/18 [place], Delice Bison, DO 07/01/18 [phone]

## 2018-07-01 NOTE — H&P (Signed)
History and Physical    Karn Derk OMV:[id number] DOB: 07-31-1970 DOA: 07/01/2018  PCP: [name], MD Consultants:  None Patient coming from:  Home - lives with husband and 2 children; NOK: Husband  Chief Complaint: abdominal pain  HPI: Kelsey Swanson is a 48 y.o. female with medical history significant of chronic pancreatitis and ETOH dependence presenting with abdominal pain.  Diffuse abdominal pain since Monday, but now it is concentrated in the LUQ.  She came in for evaluation and was encouraged to stay but she preferred to go home.  She has been unable to sleep, too painful.  No n/v/d; she did vomit Monday and Tuesday but this has improved.  No fever.  Last ETOH was about 20 days ago - she quit for 14 months and relapsed.  She reports that she has an 2yo child and her husband is significantly older than she is.  She got pregnant unexpectedly and has a 48yo and she had severe post-partum depression and turned to drinking.  She wants to quit.  She had an ER visit for the same overnight on 5/4-5 and preferred outpatient treatment.  ED Course:  Carryover, as per Dr. Hal Hope:  48 year old female with history of alcohol abuse and previous alcohol induced pancreatitis presents with persistent abdominal pain nausea vomiting for last 2 days. CT scan shows acute pancreatitis admitted for acute pancreatitis management. Receiving 2 L of fluids.  Review of Systems: As per HPI; otherwise review of systems reviewed and negative.   Ambulatory Status:  Ambulates without assistance  Past Medical History:  Diagnosis Date  . Abnormal Pap smear 1996  . Fatty liver   . Pancreatitis, chronic ([place])     Past Surgical History:  Procedure Laterality Date  . BREAST EXCISIONAL BIOPSY Right   . BREAST SURGERY     right breast  . ECTOPIC PREGNANCY SURGERY  2012    Social History   Socioeconomic History  . Marital status: Married    Spouse name: Not on file  . Number of  children: Not on file  . Years of education: Not on file  . Highest education level: Not on file  Occupational History  . Occupation: unemployed  Social Needs  . Financial resource strain: Not on file  . Food insecurity:    Worry: Not on file    Inability: Not on file  . Transportation needs:    Medical: Not on file    Non-medical: Not on file  Tobacco Use  . Smoking status: Former Smoker    Packs/day: 0.30    Years: 10.00    Pack years: 3.00    Types: Cigarettes    Last attempt to quit: 06/07/2015    Years since quitting: 3.0  . Smokeless tobacco: Never Used  Substance and Sexual Activity  . Alcohol use: Yes    Comment: 20 days in remission currently with h/o heavy use  . Drug use: No  . Sexual activity: Yes    Partners: Male    Birth control/protection: None, Implant    Comment: early preg  Lifestyle  . Physical activity:    Days per week: Not on file    Minutes per session: Not on file  . Stress: Not on file  Relationships  . Social connections:    Talks on phone: Not on file    Gets together: Not on file    Attends religious service: Not on file    Active member of club or organization: Not on  file    Attends meetings of clubs or organizations: Not on file    Relationship status: Not on file  . Intimate partner violence:    Fear of current or ex partner: Not on file    Emotionally abused: Not on file    Physically abused: Not on file    Forced sexual activity: Not on file  Other Topics Concern  . Not on file  Social History Narrative  . Not on file    No Known Allergies  Family History  Problem Relation Age of Onset  . Diabetes Father   . Diabetes Maternal Aunt   . Ovarian cancer Maternal Aunt   . Diabetes Maternal Grandmother   . Breast cancer Neg Hx     Prior to Admission medications   Medication Sig Start Date End Date Taking? Authorizing Provider  Ascorbic Acid (VITAMIN C) 1000 MG tablet Take 1,000 mg by mouth daily.   Yes [provider]  HYDROcodone-acetaminophen (NORCO/VICODIN) 5-325 MG tablet Take 1 tablet by mouth every 6 (six) hours as needed for severe pain. 06/27/18  Yes Antonietta Breach, PA-C  Multiple Vitamins-Minerals (ALIVE ONCE DAILY WOMENS) TABS Take 1 tablet by mouth daily.   Yes [provider]  ondansetron (ZOFRAN ODT) 4 MG disintegrating tablet Take 1 tablet (4 mg total) by mouth every 8 (eight) hours as needed for nausea or vomiting. 06/27/18  Yes Antonietta Breach, PA-C  pantoprazole (PROTONIX) 20 MG tablet Take 1 tablet (20 mg total) by mouth daily. 06/27/18  Yes Antonietta Breach, PA-C    Physical Exam: Vitals:   07/01/18 9518 07/01/18 0715 07/01/18 0845 07/01/18 0908  BP: 120/81 125/77 119/73 104/74  Pulse: 87 83 86 92  Resp: 19 19 (!) 21 18  Temp:    99.1 F (37.3 C)  TempSrc:    Oral  SpO2: 98% 98% 97% 100%  Weight:      Height:         . General:  Appears calm and comfortable and is NAD . Eyes:  PERRL, EOMI, normal lids, iris . ENT:  grossly normal hearing, lips & tongue, mmm . Neck:  no LAD, masses or thyromegaly; no carotid bruits . Cardiovascular:  RRR, no m/r/g. No LE edema.  Marland Kitchen Respiratory:   CTA bilaterally with no wheezes/rales/rhonchi.  Normal respiratory effort. . Abdomen:  soft, diffusely TTP with most significant TTP in LUQ, ND, NABS . Skin:  no rash or induration seen on limited exam . Musculoskeletal:  grossly normal tone BUE/BLE, good ROM, no bony abnormality . Psychiatric:  grossly normal mood and affect, speech fluent and appropriate, AOx3; ; emotionally labile when discussing her ETOH problem . Neurologic:  CN 2-12 grossly intact, moves all extremities in coordinated fashion, sensation intact    Radiological Exams on Admission: Ct Abdomen Pelvis W Contrast  Result Date: 07/01/2018 CLINICAL DATA:  Pt c/o c/o periumbilical pain. H/o pancreatitis, chronic; fatty liver EXAM: CT ABDOMEN AND PELVIS WITH CONTRAST TECHNIQUE: Multidetector CT imaging of the abdomen and pelvis  was performed using the standard protocol following bolus administration of intravenous contrast. CONTRAST:  192mL OMNIPAQUE IOHEXOL 300 MG/ML  SOLN COMPARISON:  CT 04/07/2017 FINDINGS: Lower chest: Lung bases are clear. Hepatobiliary: Diffuse low-attenuation liver. No biliary duct dilatation. No gallstones identified. Common bile duct normal caliber. Pancreas: The pancreas enhances uniformly. No duct dilatation. Extensive fluid surrounds the pancreatic head body and tail extends along the greater curvature the stomach as well as along the anterior margin of the LEFT  and RIGHT pararenal fascia. Findings consistent acute pancreatitis. On delayed imaging (series 8) there is thin enhancing rim to the fluid collection along the greater curvature the stomach (image 4/8). No evidence of vascular complication associated with pancreatitis. Spleen: Normal spleen Adrenals/urinary tract: Adrenal glands and kidneys are normal. The ureters and bladder normal. Stomach/Bowel: Stomach, duodenum, small-bowel cecum normal. Appendix not identified. The colon and rectosigmoid colon are normal. Vascular/Lymphatic: Abdominal aorta is normal caliber with atherosclerotic calcification. There is no retroperitoneal or periportal lymphadenopathy. No pelvic lymphadenopathy. Reproductive: Uterus and necks are normal Other: No intraperitoneal free air Musculoskeletal: No aggressive osseous lesion. IMPRESSION: 1. Acute pancreatitis with extensive fluid surrounding the head, body and tail the pancreas. Fluid extends along the anterior pararenal spaces and along the greater curvature of the stomach. Findings similar to CT 04/07/2017. 2. Early organization of the fluid collection along the greater curvature the stomach. 3. No evidence of pancreatic necrosis. 4. Hepatic steatosis.  No gallstones identified. Electronically Signed   By: Suzy Bouchard M.D.   On: 07/01/2018 05:59    EKG: not done   Labs on Admission: I have personally reviewed  the available labs and imaging studies at the time of the admission.  Pertinent labs:   K+ 3.3 Glucose 102 Albumin 3.4 Lipase 186; 182 on 5/4 AST 32/ALT 56 LDH 276 WBC 8.8 Hgb 11.4 UA: mod Hgb, 80 ketones HCG negative COVID negative  Assessment/Plan Principal Problem:   Acute alcoholic pancreatitis Active Problems:   Alcohol dependence in early, early partial, sustained full, or sustained partial remission ([place])   Depression   Pancreatitis -Patient with prior h/o acute alcoholic pancreatitis requiring admission in 2/19, presenting with similar symptoms -Now with frank pancreatitis by H&P, mildly elevated lipase, and CT -Her Ranson's score is 0 with a mortality risk of 0.9%. -Will observe for now -Strict NPO for now -Aggressive IVF hydration at least for the first 12 hours with LR at 200 cc/hr (following 1L infusion of banana bag) -Pain control with Toradol.   -Nausea control with Zofran  ETOH dependence in early remission -Patient with acknowledged ETOH dependence -She reports not drinking in 20 days, and definitely not since prior ER visit 5/5 -She appears to be out of the window for DTs but certainly this could be considered if issues arise -Will give banana bag x 1 and then start PO MVI, folate, thiamine tomorrow -Ongoing cessation enouraged  Depression -Suggest outpatient f/u  Note: This patient has been tested and is negative for the novel coronavirus COVID-19.  DVT prophylaxis: Lovenox  Code Status:  Full  Family Communication: None present  Disposition Plan:  Home once clinically improved Consults called: None  Admission status: It is my clinical opinion that referral for OBSERVATION is reasonable and necessary in this patient based on the above information provided. The aforementioned taken together are felt to place the patient at high risk for further clinical deterioration. However it is anticipated that the patient may be medically stable for discharge  from the hospital within 24 to 48 hours.    Karmen Bongo MD [facility]   How to contact the [facility] Attending or Consulting provider [place] or covering provider during after hours [place], for this patient?  1. Check the care team in [facility] and look for a) attending/consulting [facility] provider listed and b) the [facility] team listed 2. Log into [email or web address] and use [place]'s universal password to access. If you do not have the password, please contact the hospital operator.  3. Locate the [facility] provider you are looking for under [facility] and page to a number that you can be directly reached. 4. If you still have difficulty reaching the provider, please page the [facility] (Director on Call) for the Hospitalists listed on amion for assistance.   07/01/2018, 12:38 PM

## 2018-07-02 DIAGNOSIS — Z79899 Other long term (current) drug therapy: Secondary | ICD-10-CM | POA: Diagnosis not present

## 2018-07-02 DIAGNOSIS — Z03818 Encounter for observation for suspected exposure to other biological agents ruled out: Secondary | ICD-10-CM | POA: Diagnosis not present

## 2018-07-02 DIAGNOSIS — Z1159 Encounter for screening for other viral diseases: Secondary | ICD-10-CM | POA: Diagnosis not present

## 2018-07-02 DIAGNOSIS — R1012 Left upper quadrant pain: Secondary | ICD-10-CM | POA: Diagnosis present

## 2018-07-02 DIAGNOSIS — Z87891 Personal history of nicotine dependence: Secondary | ICD-10-CM | POA: Diagnosis not present

## 2018-07-02 DIAGNOSIS — K760 Fatty (change of) liver, not elsewhere classified: Secondary | ICD-10-CM | POA: Diagnosis not present

## 2018-07-02 DIAGNOSIS — E860 Dehydration: Secondary | ICD-10-CM | POA: Diagnosis present

## 2018-07-02 DIAGNOSIS — K852 Alcohol induced acute pancreatitis without necrosis or infection: Secondary | ICD-10-CM | POA: Diagnosis not present

## 2018-07-02 DIAGNOSIS — F1021 Alcohol dependence, in remission: Secondary | ICD-10-CM | POA: Diagnosis present

## 2018-07-02 DIAGNOSIS — F329 Major depressive disorder, single episode, unspecified: Secondary | ICD-10-CM | POA: Diagnosis not present

## 2018-07-02 DIAGNOSIS — K859 Acute pancreatitis without necrosis or infection, unspecified: Secondary | ICD-10-CM | POA: Diagnosis not present

## 2018-07-02 DIAGNOSIS — K861 Other chronic pancreatitis: Secondary | ICD-10-CM

## 2018-07-02 LAB — URINE CULTURE: Culture: NO GROWTH

## 2018-07-02 LAB — CBC
HCT: 30.3 % — ABNORMAL LOW (ref 36.0–46.0)
Hemoglobin: 9.8 g/dL — ABNORMAL LOW (ref 12.0–15.0)
MCH: 30 pg (ref 26.0–34.0)
MCHC: 32.3 g/dL (ref 30.0–36.0)
MCV: 92.7 fL (ref 80.0–100.0)
Platelets: 239 10*3/uL (ref 150–400)
RBC: 3.27 MIL/uL — ABNORMAL LOW (ref 3.87–5.11)
RDW: 13.4 % (ref 11.5–15.5)
WBC: 7.8 10*3/uL (ref 4.0–10.5)
nRBC: 0 % (ref 0.0–0.2)

## 2018-07-02 LAB — COMPREHENSIVE METABOLIC PANEL
ALT: 41 U/L (ref 0–44)
AST: 23 U/L (ref 15–41)
Albumin: 2.6 g/dL — ABNORMAL LOW (ref 3.5–5.0)
Alkaline Phosphatase: 80 U/L (ref 38–126)
Anion gap: 13 (ref 5–15)
BUN: 5 mg/dL — ABNORMAL LOW (ref 6–20)
CO2: 21 mmol/L — ABNORMAL LOW (ref 22–32)
Calcium: 8.3 mg/dL — ABNORMAL LOW (ref 8.9–10.3)
Chloride: 104 mmol/L (ref 98–111)
Creatinine, Ser: 0.59 mg/dL (ref 0.44–1.00)
GFR calc Af Amer: >60 mL/min (ref >60)
GFR calc non Af Amer: >60 mL/min (ref >60)
Glucose, Bld: 80 mg/dL (ref 70–99)
Potassium: 3.5 mmol/L (ref 3.5–5.1)
Sodium: 138 mmol/L (ref 135–145)
Total Bilirubin: 1 mg/dL (ref 0.3–1.2)
Total Protein: 5.7 g/dL — ABNORMAL LOW (ref 6.5–8.1)

## 2018-07-02 LAB — HIV ANTIBODY (ROUTINE TESTING W REFLEX): HIV Screen 4th Generation wRfx: NONREACTIVE

## 2018-07-02 MED ORDER — MORPHINE SULFATE (PF) 2 MG/ML IV SOLN
2.0000 mg | Freq: Once | INTRAVENOUS | Status: AC
  Administered 2018-07-02: 2 mg via INTRAVENOUS
  Filled 2018-07-02: qty 1

## 2018-07-02 MED ORDER — MORPHINE SULFATE (PF) 4 MG/ML IV SOLN
4.0000 mg | Freq: Once | INTRAVENOUS | Status: AC
  Administered 2018-07-02: 4 mg via INTRAVENOUS
  Filled 2018-07-02: qty 1

## 2018-07-02 NOTE — Plan of Care (Signed)
  Problem: Education: Goal: Knowledge of General Education information will improve Description Including pain rating scale, medication(s)/side effects and non-pharmacologic comfort measures Outcome: Progressing   Problem: Clinical Measurements: Goal: Respiratory complications will improve Outcome: Not Applicable   Problem: Activity: Goal: Risk for activity intolerance will decrease Outcome: Progressing   Problem: Elimination: Goal: Will not experience complications related to bowel motility Outcome: Progressing

## 2018-07-02 NOTE — Progress Notes (Signed)
PROGRESS NOTE    Kelsey Swanson  ACZ:[id number] DOB: 1970-11-06 DOA: 07/01/2018 PCP: [name], MD    Brief Narrative:  48 y.o. female with medical history significant of chronic pancreatitis and ETOH dependence presenting with abdominal pain.  Diffuse abdominal pain since Monday, but now it is concentrated in the LUQ.  She came in for evaluation and was encouraged to stay but she preferred to go home.  She has been unable to sleep, too painful.  No n/v/d; she did vomit Monday and Tuesday but this has improved.  No fever.  Last ETOH was about 20 days ago - she quit for 14 months and relapsed.  She reports that she has an 64yo child and her husband is significantly older than she is.  She got pregnant unexpectedly and has a 46yo and she had severe post-partum depression and turned to drinking.  She wants to quit.  She had an ER visit for the same overnight on 5/4-5 and preferred outpatient treatment.  Assessment & Plan:   Principal Problem:   Acute alcoholic pancreatitis Active Problems:   Alcohol dependence in early, early partial, sustained full, or sustained partial remission ([place])   Depression    Acute Pancreatitis -Patient with prior h/o acute alcoholic pancreatitis requiring admission in 2/19, presenting with similar symptoms -Now with frank pancreatitis by H&P,mildlyelevated lipase, and CT -Her Ranson's score is 0 with a mortality risk of 0.9%. -Initially NPO -Pt received aggressive IVF hydration -Started clear liquid, advance diet as tolerated. Patient did report continued pain even with clear liquid, thus will slowly advance -Pain control with Toradol but required morphine this AM -Nausea control with Zofran  ETOH dependence in early remission -Patient with acknowledged ETOH dependence -She reports not drinking in 20 days, and definitely not since prior ER visit 5/5 -She appears to be out of the window for DTs but certainly this could be considered if issues  arise -Will give banana bag x 1 and then start PO MVI, folate, thiamine tomorrow -Cessation done at bedside  Depression -Suggest outpatient f/u   DVT prophylaxis: Lovenox subQ Code Status: Full Family Communication: Pt in room, family not at bedside Disposition Plan: Uncertain at this time  Consultants:     Procedures:     Antimicrobials: Anti-infectives (From admission, onward)   None       Subjective: Feeling better, however still complaining of abd pain even with clear liquid diet  Objective: Vitals:   07/01/18 0908 07/01/18 2123 07/02/18 0424 07/02/18 1326  BP: 104/74 132/82 (!) 143/82 129/80  Pulse: 92 79 82 84  Resp: 18 18 20 16   Temp: 99.1 F (37.3 C) 98.3 F (36.8 C) 98.7 F (37.1 C) 99 F (37.2 C)  TempSrc: Oral Oral Oral Oral  SpO2: 100% 100% 100% 99%  Weight:      Height:        Intake/Output Summary (Last 24 hours) at 07/02/2018 1519 Last data filed at 07/02/2018 1417 Gross per 24 hour  Intake 3774 ml  Output -  Net 3774 ml   Filed Weights   07/01/18 0353  Weight: 86.2 kg    Examination:  General exam: Appears calm and comfortable  Respiratory system: Clear to auscultation. Respiratory effort normal. Cardiovascular system: S1 & S2 heard, RRR Gastrointestinal system: nondistended, pos BS, mildly tender Central nervous system: Alert and oriented. No focal neurological deficits. Extremities: Symmetric 5 x 5 power. Skin: No rashes, lesions Psychiatry: Judgement and insight appear normal. Mood & affect appropriate.  Data Reviewed: I have personally reviewed following labs and imaging studies  CBC: Recent Labs  Lab 06/26/18 1819 07/01/18 0233 07/02/18 0230  WBC 10.7* 8.8 7.8  HGB 13.9 11.4* 9.8*  HCT 40.0 34.2* 30.3*  MCV 87.3 91.7 92.7  PLT 132* 261 409   Basic Metabolic Panel: Recent Labs  Lab 06/26/18 1819 07/01/18 0233 07/02/18 0230  NA 130* 135 138  K 3.1* 3.3* 3.5  CL 93* 95* 104  CO2 21* 26 21*  GLUCOSE 138*  102* 80  BUN 14 6 5*  CREATININE 0.70 0.57 0.59  CALCIUM 6.9* 9.2 8.3*   GFR: Estimated Creatinine Clearance: 94.3 mL/min (by C-G formula based on SCr of 0.59 mg/dL). Liver Function Tests: Recent Labs  Lab 06/26/18 1819 07/01/18 0233 07/02/18 0230  AST 43* 32 23  ALT 71* 56* 41  ALKPHOS 78 103 80  BILITOT 2.4* 1.1 1.0  PROT 6.9 7.1 5.7*  ALBUMIN 3.4* 3.4* 2.6*   Recent Labs  Lab 06/26/18 1819 07/01/18 0233  LIPASE 182* 186*   No results for input(s): AMMONIA in the last 168 hours. Coagulation Profile: No results for input(s): INR, PROTIME in the last 168 hours. Cardiac Enzymes: No results for input(s): CKTOTAL, CKMB, CKMBINDEX, TROPONINI in the last 168 hours. BNP (last 3 results) No results for input(s): PROBNP in the last 8760 hours. HbA1C: No results for input(s): HGBA1C in the last 72 hours. CBG: No results for input(s): GLUCAP in the last 168 hours. Lipid Profile: No results for input(s): CHOL, HDL, LDLCALC, TRIG, CHOLHDL, LDLDIRECT in the last 72 hours. Thyroid Function Tests: No results for input(s): TSH, T4TOTAL, FREET4, T3FREE, THYROIDAB in the last 72 hours. Anemia Panel: No results for input(s): VITAMINB12, FOLATE, FERRITIN, TIBC, IRON, RETICCTPCT in the last 72 hours. Sepsis Labs: No results for input(s): PROCALCITON, LATICACIDVEN in the last 168 hours.  Recent Results (from the past 240 hour(s))  Urine culture     Status: None   Collection Time: 07/01/18  2:36 AM  Result Value Ref Range Status   Specimen Description URINE, RANDOM  Final   Special Requests NONE  Final   Culture   Final    NO GROWTH Performed at [facility], [address], [place], [place] [address]    Report Status 07/02/2018 FINAL  Final  SARS Coronavirus 2 (CEPHEID - Performed in [place] hospital lab), Hosp Order     Status: None   Collection Time: 07/01/18  8:25 AM  Result Value Ref Range Status   SARS Coronavirus 2 NEGATIVE NEGATIVE Final    Comment: (NOTE) If  result is NEGATIVE SARS-CoV-2 target nucleic acids are NOT DETECTED. The SARS-CoV-2 RNA is generally detectable in upper and lower  respiratory specimens during the acute phase of infection. The lowest  concentration of SARS-CoV-2 viral copies this assay can detect is 250  copies / mL. A negative result does not preclude SARS-CoV-2 infection  and should not be used as the sole basis for treatment or other  patient management decisions.  A negative result may occur with  improper specimen collection / handling, submission of specimen other  than nasopharyngeal swab, presence of viral mutation(s) within the  areas targeted by this assay, and inadequate number of viral copies  (<250 copies / mL). A negative result must be combined with clinical  observations, patient history, and epidemiological information. If result is POSITIVE SARS-CoV-2 target nucleic acids are DETECTED. The SARS-CoV-2 RNA is generally detectable in upper and lower  respiratory  specimens dur ing the acute phase of infection.  Positive  results are indicative of active infection with SARS-CoV-2.  Clinical  correlation with patient history and other diagnostic information is  necessary to determine patient infection status.  Positive results do  not rule out bacterial infection or co-infection with other viruses. If result is PRESUMPTIVE POSTIVE SARS-CoV-2 nucleic acids MAY BE PRESENT.   A presumptive positive result was obtained on the submitted specimen  and confirmed on repeat testing.  While 2019 novel coronavirus  (SARS-CoV-2) nucleic acids may be present in the submitted sample  additional confirmatory testing may be necessary for epidemiological  and / or clinical management purposes  to differentiate between  SARS-CoV-2 and other Sarbecovirus currently known to infect humans.  If clinically indicated additional testing with an alternate test  methodology [phone]) is advised. The SARS-CoV-2 RNA is generally   detectable in upper and lower respiratory sp ecimens during the acute  phase of infection. The expected result is Negative. Fact Sheet for Patients:  StrictlyIdeas.no Fact Sheet for Healthcare Providers: BankingDealers.co.za This test is not yet approved or cleared by the [place] FDA and has been authorized for detection and/or diagnosis of SARS-CoV-2 by FDA under an Emergency Use Authorization (EUA).  This EUA will remain in effect (meaning this test can be used) for the duration of the COVID-19 declaration under Section 564(b)(1) of the Act, 21 U.S.C. section 360bbb-3(b)(1), unless the authorization is terminated or revoked sooner. Performed at [facility], [place] [address], [place], [place] [address]   Culture, blood (routine x 2)     Status: None (Preliminary result)   Collection Time: 07/01/18 11:45 AM  Result Value Ref Range Status   Specimen Description BLOOD RIGHT HAND  Final   Special Requests AEROBIC BOTTLE ONLY Blood Culture adequate volume  Final   Culture   Final    NO GROWTH < 24 HOURS Performed at [facility], [place] [address], [place], [place] [address]    Report Status PENDING  Incomplete  Culture, blood (routine x 2)     Status: None (Preliminary result)   Collection Time: 07/01/18 11:47 AM  Result Value Ref Range Status   Specimen Description BLOOD LEFT HAND  Final   Special Requests AEROBIC BOTTLE ONLY Blood Culture adequate volume  Final   Culture   Final    NO GROWTH < 24 HOURS Performed at [facility], [place] [address] [place], [place] [address]    Report Status PENDING  Incomplete     Radiology Studies: Ct Abdomen Pelvis W Contrast  Result Date: 07/01/2018 CLINICAL DATA:  Pt c/o c/o periumbilical pain. H/o pancreatitis, chronic; fatty liver EXAM: CT ABDOMEN AND PELVIS WITH CONTRAST TECHNIQUE: Multidetector CT imaging of the abdomen and pelvis was performed using the standard protocol  following bolus administration of intravenous contrast. CONTRAST:  132mL OMNIPAQUE IOHEXOL 300 MG/ML  SOLN COMPARISON:  CT 04/07/2017 FINDINGS: Lower chest: Lung bases are clear. Hepatobiliary: Diffuse low-attenuation liver. No biliary duct dilatation. No gallstones identified. Common bile duct normal caliber. Pancreas: The pancreas enhances uniformly. No duct dilatation. Extensive fluid surrounds the pancreatic head body and tail extends along the greater curvature the stomach as well as along the anterior margin of the LEFT and RIGHT pararenal fascia. Findings consistent acute pancreatitis. On delayed imaging (series 8) there is thin enhancing rim to the fluid collection along the greater curvature the stomach (image 4/8). No evidence of vascular complication associated with pancreatitis. Spleen: Normal  spleen Adrenals/urinary tract: Adrenal glands and kidneys are normal. The ureters and bladder normal. Stomach/Bowel: Stomach, duodenum, small-bowel cecum normal. Appendix not identified. The colon and rectosigmoid colon are normal. Vascular/Lymphatic: Abdominal aorta is normal caliber with atherosclerotic calcification. There is no retroperitoneal or periportal lymphadenopathy. No pelvic lymphadenopathy. Reproductive: Uterus and necks are normal Other: No intraperitoneal free air Musculoskeletal: No aggressive osseous lesion. IMPRESSION: 1. Acute pancreatitis with extensive fluid surrounding the head, body and tail the pancreas. Fluid extends along the anterior pararenal spaces and along the greater curvature of the stomach. Findings similar to CT 04/07/2017. 2. Early organization of the fluid collection along the greater curvature the stomach. 3. No evidence of pancreatic necrosis. 4. Hepatic steatosis.  No gallstones identified. Electronically Signed   By: Suzy Bouchard M.D.   On: 07/01/2018 05:59    Scheduled Meds: . enoxaparin (LOVENOX) injection  40 mg Subcutaneous Q24H  . folic acid  1 mg Oral Daily   . multivitamin with minerals  1 tablet Oral Daily  . thiamine  100 mg Oral Daily   Continuous Infusions: . lactated ringers 200 mL/hr at 07/02/18 1416     LOS: 0 days   Marylu Lund, MD [facility] Pager On Amion  If 7PM-7AM, please contact night-coverage 07/02/2018, 3:19 PM

## 2018-07-03 LAB — COMPREHENSIVE METABOLIC PANEL
ALT: 38 U/L (ref 0–44)
AST: 22 U/L (ref 15–41)
Albumin: 2.7 g/dL — ABNORMAL LOW (ref 3.5–5.0)
Alkaline Phosphatase: 77 U/L (ref 38–126)
Anion gap: 12 (ref 5–15)
BUN: <5 mg/dL — ABNORMAL LOW (ref 6–20)
CO2: 26 mmol/L (ref 22–32)
Calcium: 8.7 mg/dL — ABNORMAL LOW (ref 8.9–10.3)
Chloride: 100 mmol/L (ref 98–111)
Creatinine, Ser: 0.53 mg/dL (ref 0.44–1.00)
GFR calc Af Amer: >60 mL/min (ref >60)
GFR calc non Af Amer: >60 mL/min (ref >60)
Glucose, Bld: 93 mg/dL (ref 70–99)
Potassium: 3.2 mmol/L — ABNORMAL LOW (ref 3.5–5.1)
Sodium: 138 mmol/L (ref 135–145)
Total Bilirubin: 0.6 mg/dL (ref 0.3–1.2)
Total Protein: 5.8 g/dL — ABNORMAL LOW (ref 6.5–8.1)

## 2018-07-03 LAB — LIPASE, BLOOD: Lipase: 219 U/L — ABNORMAL HIGH (ref 11–51)

## 2018-07-03 MED ORDER — HYDROCODONE-ACETAMINOPHEN 5-325 MG PO TABS: 1.0000 | ORAL_TABLET | Freq: Four times a day (QID) | ORAL | 0 refills | Status: DC | PRN

## 2018-07-03 MED FILL — HYDROCODON-APAP 5-325: 5-325 | 5 days supply | Qty: 20 | Fill #0

## 2018-07-03 NOTE — Discharge Summary (Signed)
Physician Discharge Summary  Kelsey Swanson MWU:[id number] DOB: [birth year] DOA: 07/01/2018  PCP: [name], MD  Admit date: 07/01/2018 Discharge date: 07/03/2018  Admitted From: Home Disposition:  Home  Recommendations for Outpatient Follow-up:  1. Follow up with PCP in 1-2 weeks  Discharge Condition:Improved CODE STATUS:Full Diet recommendation: Soft, low fat   Brief/Interim Summary: 48 y.o.femalewith medical history significant ofchronic pancreatitis and ETOH dependence presenting with abdominal pain.Diffuse abdominal pain since Monday, but now it is concentrated in the LUQ. She came in for evaluation and was encouraged to stay but she preferred to go home. She has been unable to sleep, too painful. No n/v/d; she did vomit Monday and Tuesday but this has improved. No fever.Last ETOH was about 20 days ago - she quit for 14 months and relapsed.She reports that she has an 80yo child and her husband is significantly older than she is. She got pregnant unexpectedly and has a 46yo and she had severe post-partum depression and turned to drinking. She wants to quit.  She had an ER visit for the same overnight on 5/4-5 and preferred outpatient treatment.  Discharge Diagnoses:  Principal Problem:   Acute alcoholic pancreatitis Active Problems:   Alcohol dependence in early, early partial, sustained full, or sustained partial remission ([place])   Depression   Acute pancreatitis  Acute Pancreatitis -Patient with prior h/o acutealcoholicpancreatitis requiring admission in 2/19,presenting with similar symptoms -Now with frank pancreatitis by H&P,mildlyelevated lipase, andCT -HerRanson'sscore is 0with a mortality risk of 0.9%. -Initially NPO -Pt received aggressive IVF hydration -Started clear liquid, advance diet as tolerated. Patient did report continued pain even with clear liquid. Successfully advanced diet to soft -By day of discharge, pt reported feeling much  better. Eager to go home  ETOH dependence in early remission -Patient with acknowledged ETOH dependence -She reports not drinking in 20 days, and definitely not since prior ER visit 5/5 -She appears to be out of the window for DTs but certainly this could be considered if issues arise -Will give banana bag x 1 and then start PO MVI, folate, thiamine tomorrow -Cessation done at bedside  Depression -Suggest outpatient f/u   Discharge Instructions   Allergies as of 07/03/2018   No Known Allergies     Medication List    TAKE these medications   Alive Once Daily Womens Tabs Take 1 tablet by mouth daily.   HYDROcodone-acetaminophen 5-325 MG tablet Commonly known as:  NORCO/VICODIN Take 1 tablet by mouth every 6 (six) hours as needed for severe pain.   ondansetron 4 MG disintegrating tablet Commonly known as:  Zofran ODT Take 1 tablet (4 mg total) by mouth every 8 (eight) hours as needed for nausea or vomiting.   pantoprazole 20 MG tablet Commonly known as:  PROTONIX Take 1 tablet (20 mg total) by mouth daily.   vitamin C 1000 MG tablet Take 1,000 mg by mouth daily.       No Known Allergies    Procedures/Studies: Ct Abdomen Pelvis W Contrast  Result Date: 07/01/2018 CLINICAL DATA:  Pt c/o c/o periumbilical pain. H/o pancreatitis, chronic; fatty liver EXAM: CT ABDOMEN AND PELVIS WITH CONTRAST TECHNIQUE: Multidetector CT imaging of the abdomen and pelvis was performed using the standard protocol following bolus administration of intravenous contrast. CONTRAST:  125mL OMNIPAQUE IOHEXOL 300 MG/ML  SOLN COMPARISON:  CT 04/07/2017 FINDINGS: Lower chest: Lung bases are clear. Hepatobiliary: Diffuse low-attenuation liver. No biliary duct dilatation. No gallstones identified. Common bile duct normal caliber. Pancreas: The pancreas enhances  uniformly. No duct dilatation. Extensive fluid surrounds the pancreatic head body and tail extends along the greater curvature the stomach as  well as along the anterior margin of the LEFT and RIGHT pararenal fascia. Findings consistent acute pancreatitis. On delayed imaging (series 8) there is thin enhancing rim to the fluid collection along the greater curvature the stomach (image 4/8). No evidence of vascular complication associated with pancreatitis. Spleen: Normal spleen Adrenals/urinary tract: Adrenal glands and kidneys are normal. The ureters and bladder normal. Stomach/Bowel: Stomach, duodenum, small-bowel cecum normal. Appendix not identified. The colon and rectosigmoid colon are normal. Vascular/Lymphatic: Abdominal aorta is normal caliber with atherosclerotic calcification. There is no retroperitoneal or periportal lymphadenopathy. No pelvic lymphadenopathy. Reproductive: Uterus and necks are normal Other: No intraperitoneal free air Musculoskeletal: No aggressive osseous lesion. IMPRESSION: 1. Acute pancreatitis with extensive fluid surrounding the head, body and tail the pancreas. Fluid extends along the anterior pararenal spaces and along the greater curvature of the stomach. Findings similar to CT 04/07/2017. 2. Early organization of the fluid collection along the greater curvature the stomach. 3. No evidence of pancreatic necrosis. 4. Hepatic steatosis.  No gallstones identified. Electronically Signed   By: Suzy Bouchard M.D.   On: 07/01/2018 05:59     Subjective: Eager to go home today  Discharge Exam: Vitals:   07/02/18 2052 07/03/18 0455  BP: 138/80 136/85  Pulse: 70 65  Resp: 20 18  Temp: 99.1 F (37.3 C) 99.1 F (37.3 C)  SpO2: 100% 100%   Vitals:   07/02/18 0424 07/02/18 1326 07/02/18 2052 07/03/18 0455  BP: (!) 143/82 129/80 138/80 136/85  Pulse: 82 84 70 65  Resp: 20 16 20 18   Temp: 98.7 F (37.1 C) 99 F (37.2 C) 99.1 F (37.3 C) 99.1 F (37.3 C)  TempSrc: Oral Oral Oral Oral  SpO2: 100% 99% 100% 100%  Weight:      Height:        General: Pt is alert, awake, not in acute  distress Cardiovascular: RRR, S1/S2 +, no rubs, no gallops Respiratory: CTA bilaterally, no wheezing, no rhonchi Abdominal: Soft, NT, ND, bowel sounds + Extremities: no edema, no cyanosis   The results of significant diagnostics from this hospitalization (including imaging, microbiology, ancillary and laboratory) are listed below for reference.     Microbiology: Recent Results (from the past 240 hour(s))  Urine culture     Status: None   Collection Time: 07/01/18  2:36 AM  Result Value Ref Range Status   Specimen Description URINE, RANDOM  Final   Special Requests NONE  Final   Culture   Final    NO GROWTH Performed at [facility], [address], [place], [place] [address]    Report Status 07/02/2018 FINAL  Final  SARS Coronavirus 2 (CEPHEID - Performed in [facility] hospital lab), Hosp Order     Status: None   Collection Time: 07/01/18  8:25 AM  Result Value Ref Range Status   SARS Coronavirus 2 NEGATIVE NEGATIVE Final    Comment: (NOTE) If result is NEGATIVE SARS-CoV-2 target nucleic acids are NOT DETECTED. The SARS-CoV-2 RNA is generally detectable in upper and lower  respiratory specimens during the acute phase of infection. The lowest  concentration of SARS-CoV-2 viral copies this assay can detect is 250  copies / mL. A negative result does not preclude SARS-CoV-2 infection  and should not be used as the sole basis for treatment or other  patient management decisions.  A negative result  may occur with  improper specimen collection / handling, submission of specimen other  than nasopharyngeal swab, presence of viral mutation(s) within the  areas targeted by this assay, and inadequate number of viral copies  (<250 copies / mL). A negative result must be combined with clinical  observations, patient history, and epidemiological information. If result is POSITIVE SARS-CoV-2 target nucleic acids are DETECTED. The SARS-CoV-2 RNA is generally detectable in upper and  lower  respiratory specimens dur ing the acute phase of infection.  Positive  results are indicative of active infection with SARS-CoV-2.  Clinical  correlation with patient history and other diagnostic information is  necessary to determine patient infection status.  Positive results do  not rule out bacterial infection or co-infection with other viruses. If result is PRESUMPTIVE POSTIVE SARS-CoV-2 nucleic acids MAY BE PRESENT.   A presumptive positive result was obtained on the submitted specimen  and confirmed on repeat testing.  While 2019 novel coronavirus  (SARS-CoV-2) nucleic acids may be present in the submitted sample  additional confirmatory testing may be necessary for epidemiological  and / or clinical management purposes  to differentiate between  SARS-CoV-2 and other Sarbecovirus currently known to infect humans.  If clinically indicated additional testing with an alternate test  methodology [phone]) is advised. The SARS-CoV-2 RNA is generally  detectable in upper and lower respiratory sp ecimens during the acute  phase of infection. The expected result is Negative. Fact Sheet for Patients:  StrictlyIdeas.no Fact Sheet for Healthcare Providers: BankingDealers.co.za This test is not yet approved or cleared by the [place] FDA and has been authorized for detection and/or diagnosis of SARS-CoV-2 by FDA under an Emergency Use Authorization (EUA).  This EUA will remain in effect (meaning this test can be used) for the duration of the COVID-19 declaration under Section 564(b)(1) of the Act, 21 U.S.C. section 360bbb-3(b)(1), unless the authorization is terminated or revoked sooner. Performed at [facility], [place] [address], [place], [place] [address]   Culture, blood (routine x 2)     Status: None (Preliminary result)   Collection Time: 07/01/18 11:45 AM  Result Value Ref Range Status   Specimen Description BLOOD  RIGHT HAND  Final   Special Requests AEROBIC BOTTLE ONLY Blood Culture adequate volume  Final   Culture   Final    NO GROWTH 2 DAYS Performed at [facility], [place] [address] [place], [place] [address]    Report Status PENDING  Incomplete  Culture, blood (routine x 2)     Status: None (Preliminary result)   Collection Time: 07/01/18 11:47 AM  Result Value Ref Range Status   Specimen Description BLOOD LEFT HAND  Final   Special Requests AEROBIC BOTTLE ONLY Blood Culture adequate volume  Final   Culture   Final    NO GROWTH 2 DAYS Performed at [facility], [place] [address], [place], [place] [address]    Report Status PENDING  Incomplete     Labs: BNP (last 3 results) No results for input(s): BNP in the last 8760 hours. Basic Metabolic Panel: Recent Labs  Lab 06/26/18 1819 07/01/18 0233 07/02/18 0230 07/03/18 0302  NA 130* 135 138 138  K 3.1* 3.3* 3.5 3.2*  CL 93* 95* 104 100  CO2 21* 26 21* 26  GLUCOSE 138* 102* 80 93  BUN 14 6 5* <5*  CREATININE 0.70 0.57 0.59 0.53  CALCIUM 6.9* 9.2 8.3* 8.7*   Liver Function Tests: Recent Labs  Lab  06/26/18 1819 07/01/18 0233 07/02/18 0230 07/03/18 0302  AST 43* 32 23 22  ALT 71* 56* 41 38  ALKPHOS 78 103 80 77  BILITOT 2.4* 1.1 1.0 0.6  PROT 6.9 7.1 5.7* 5.8*  ALBUMIN 3.4* 3.4* 2.6* 2.7*   Recent Labs  Lab 06/26/18 1819 07/01/18 0233 07/03/18 0302  LIPASE 182* 186* 219*   No results for input(s): AMMONIA in the last 168 hours. CBC: Recent Labs  Lab 06/26/18 1819 07/01/18 0233 07/02/18 0230  WBC 10.7* 8.8 7.8  HGB 13.9 11.4* 9.8*  HCT 40.0 34.2* 30.3*  MCV 87.3 91.7 92.7  PLT 132* 261 239   Cardiac Enzymes: No results for input(s): CKTOTAL, CKMB, CKMBINDEX, TROPONINI in the last 168 hours. BNP: Invalid input(s): POCBNP CBG: No results for input(s): GLUCAP in the last 168 hours. D-Dimer No results for input(s): DDIMER in the last 72 hours. Hgb A1c No results for input(s): HGBA1C in the last 72  hours. Lipid Profile No results for input(s): CHOL, HDL, LDLCALC, TRIG, CHOLHDL, LDLDIRECT in the last 72 hours. Thyroid function studies No results for input(s): TSH, T4TOTAL, T3FREE, THYROIDAB in the last 72 hours.  Invalid input(s): FREET3 Anemia work up No results for input(s): VITAMINB12, FOLATE, FERRITIN, TIBC, IRON, RETICCTPCT in the last 72 hours. Urinalysis    Component Value Date/Time   COLORURINE YELLOW 07/01/2018 0236   APPEARANCEUR CLEAR 07/01/2018 0236   LABSPEC 1.012 07/01/2018 0236   PHURINE 6.0 07/01/2018 0236   GLUCOSEU NEGATIVE 07/01/2018 0236   HGBUR MODERATE (A) 07/01/2018 0236   BILIRUBINUR NEGATIVE 07/01/2018 0236   BILIRUBINUR neg 11/13/2015 1656   KETONESUR 80 (A) 07/01/2018 0236   PROTEINUR NEGATIVE 07/01/2018 0236   UROBILINOGEN negative 11/13/2015 1656   UROBILINOGEN 0.2 06/04/2009 1316   NITRITE NEGATIVE 07/01/2018 0236   LEUKOCYTESUR NEGATIVE 07/01/2018 0236   Sepsis Labs Invalid input(s): PROCALCITONIN,  WBC,  LACTICIDVEN Microbiology Recent Results (from the past 240 hour(s))  Urine culture     Status: None   Collection Time: 07/01/18  2:36 AM  Result Value Ref Range Status   Specimen Description URINE, RANDOM  Final   Special Requests NONE  Final   Culture   Final    NO GROWTH Performed at [facility], [place] [address] [place], [place] [address]    Report Status 07/02/2018 FINAL  Final  SARS Coronavirus 2 (CEPHEID - Performed in [facility] hospital lab), Hosp Order     Status: None   Collection Time: 07/01/18  8:25 AM  Result Value Ref Range Status   SARS Coronavirus 2 NEGATIVE NEGATIVE Final    Comment: (NOTE) If result is NEGATIVE SARS-CoV-2 target nucleic acids are NOT DETECTED. The SARS-CoV-2 RNA is generally detectable in upper and lower  respiratory specimens during the acute phase of infection. The lowest  concentration of SARS-CoV-2 viral copies this assay can detect is 250  copies / mL. A negative result does not  preclude SARS-CoV-2 infection  and should not be used as the sole basis for treatment or other  patient management decisions.  A negative result may occur with  improper specimen collection / handling, submission of specimen other  than nasopharyngeal swab, presence of viral mutation(s) within the  areas targeted by this assay, and inadequate number of viral copies  (<250 copies / mL). A negative result must be combined with clinical  observations, patient history, and epidemiological information. If result is POSITIVE SARS-CoV-2 target nucleic acids are DETECTED. The SARS-CoV-2 RNA is generally detectable  in upper and lower  respiratory specimens dur ing the acute phase of infection.  Positive  results are indicative of active infection with SARS-CoV-2.  Clinical  correlation with patient history and other diagnostic information is  necessary to determine patient infection status.  Positive results do  not rule out bacterial infection or co-infection with other viruses. If result is PRESUMPTIVE POSTIVE SARS-CoV-2 nucleic acids MAY BE PRESENT.   A presumptive positive result was obtained on the submitted specimen  and confirmed on repeat testing.  While 2019 novel coronavirus  (SARS-CoV-2) nucleic acids may be present in the submitted sample  additional confirmatory testing may be necessary for epidemiological  and / or clinical management purposes  to differentiate between  SARS-CoV-2 and other Sarbecovirus currently known to infect humans.  If clinically indicated additional testing with an alternate test  methodology [phone]) is advised. The SARS-CoV-2 RNA is generally  detectable in upper and lower respiratory sp ecimens during the acute  phase of infection. The expected result is Negative. Fact Sheet for Patients:  StrictlyIdeas.no Fact Sheet for Healthcare Providers: BankingDealers.co.za This test is not yet approved or cleared by  the [place] FDA and has been authorized for detection and/or diagnosis of SARS-CoV-2 by FDA under an Emergency Use Authorization (EUA).  This EUA will remain in effect (meaning this test can be used) for the duration of the COVID-19 declaration under Section 564(b)(1) of the Act, 21 U.S.C. section 360bbb-3(b)(1), unless the authorization is terminated or revoked sooner. Performed at [facility], [place] [address], [place], [place] [address]   Culture, blood (routine x 2)     Status: None (Preliminary result)   Collection Time: 07/01/18 11:45 AM  Result Value Ref Range Status   Specimen Description BLOOD RIGHT HAND  Final   Special Requests AEROBIC BOTTLE ONLY Blood Culture adequate volume  Final   Culture   Final    NO GROWTH 2 DAYS Performed at [facility], [place] [address] [place],  [address]    Report Status PENDING  Incomplete  Culture, blood (routine x 2)     Status: None (Preliminary result)   Collection Time: 07/01/18 11:47 AM  Result Value Ref Range Status   Specimen Description BLOOD LEFT HAND  Final   Special Requests AEROBIC BOTTLE ONLY Blood Culture adequate volume  Final   Culture   Final    NO GROWTH 2 DAYS Performed at [facility], [place] [address] [place],  [address]    Report Status PENDING  Incomplete   Time spent: 90min  SIGNED:   Marylu Lund, MD  [facility] 07/03/2018, 9:26 AM  If 7PM-7AM, please contact night-coverage

## 2018-07-03 NOTE — TOC Transition Note (Signed)
Transition of Care [facility]) - CM/SW Discharge Note   Patient Details  Name: Kelsey Swanson MRN: [id number] Date of Birth: 11-Oct-1970  Transition of Care [facility]) CM/SW Contact:  Marilu Favre, RN Phone Number: 07/03/2018, 10:04 AM   Clinical Narrative:     Patient to discharge to home today , has script for Percocet in [facility] cost $14.00, patient only has $5. Patient not eligible  For [facility] program. Patient called daughter daughter has [employer] card , however, TOC does not take [employer]. Daughter is going to take pictures of her mother's credit card send it to her mother and [facility] will call patient directly for credit card information.  Final next level of care: Home/Self Care Barriers to Discharge: No Barriers Identified   Patient Goals and CMS Choice Patient states their goals for this hospitalization and ongoing recovery are:: to go home  CMS Medicare.gov Compare Post Acute Care list provided to:: Patient Choice offered to / list presented to : NA  Discharge Placement                       Discharge Plan and Services In-house Referral: Financial Counselor Discharge Planning Services: CM Consult            DME Arranged: N/A DME Agency: NA       HH Arranged: NA HH Agency: NA        Social Determinants of Health (SDOH) Interventions     Readmission Risk Interventions No flowsheet data found.

## 2018-07-06 LAB — CULTURE, BLOOD (ROUTINE X 2)
Culture: NO GROWTH
Culture: NO GROWTH
Special Requests: ADEQUATE
Special Requests: ADEQUATE

## 2018-07-10 ENCOUNTER — Other Ambulatory Visit: Payer: Self-pay

## 2018-07-10 ENCOUNTER — Ambulatory Visit: Payer: Medicaid Other | Attending: Internal Medicine | Admitting: Internal Medicine

## 2018-07-10 ENCOUNTER — Encounter: Payer: Self-pay | Admitting: Internal Medicine

## 2018-07-10 DIAGNOSIS — G5603 Carpal tunnel syndrome, bilateral upper limbs: Secondary | ICD-10-CM | POA: Diagnosis not present

## 2018-07-10 DIAGNOSIS — Z8719 Personal history of other diseases of the digestive system: Secondary | ICD-10-CM

## 2018-07-10 DIAGNOSIS — F1011 Alcohol abuse, in remission: Secondary | ICD-10-CM

## 2018-07-10 MED ORDER — NAPROXEN 500 MG PO TABS: 500.0000 mg | ORAL_TABLET | Freq: Two times a day (BID) | ORAL | 1 refills | Status: DC | PRN

## 2018-07-10 MED FILL — NAPROXEN 500 MG TABLET: 500 | 20 days supply | Qty: 40 | Fill #0

## 2018-07-10 NOTE — Progress Notes (Signed)
Would like to have refill for Naproxen d/t hand pain.   Has pain occasionally but feels but   [place]-  [phone]

## 2018-07-10 NOTE — Progress Notes (Signed)
Virtual Visit via Telephone Note Due to current restrictions/limitations of in-office visits due to the COVID-19 pandemic, this scheduled clinical appointment was converted to a telehealth visit  I connected with Kelsey Swanson on 07/10/18 at 3:39 p.m by telephone and verified that I am speaking with the correct person using two identifiers. I am in my office.  The patient is at home.  Only the patient,myself and interpreter from [facility] [phone]) participated in this encounter.  I discussed the limitations, risks, security and privacy concerns of performing an evaluation and management service by telephone and the availability of in person appointments. I also discussed with the patient that there may be a patient responsible charge related to this service. The patient expressed understanding and agreed to proceed.   History of Present Illness: Patient with history of EtOH abuse, Fatty liver, HL and alcoholic pancreatitis.  Last seen 01/2018   This was a hospital follow-up encounter.  Patient hospitalized 5/9-12/2018 with acute pancreatitis in presence of relapse of ETOH abuse. Prior to this, she was free of ETOH for over 1 yr.  She has abstain from ETOH since hospital dischg.  Going to support meetings once a wk.  Reports good family support.   Pt states she started drinking about 6 mths after her last child was born 2.5 yrs ago.   She endorses occasional crying.  No problems sleeping.  No thoughts of wanting to hurt self or anyone else  Request RF on Naprosyn for pain in her hands.  Hx of BL CTS.   Observations/Objective:   Chemistry      Component Value Date/Time   NA 138 07/03/2018 0302   NA 140 04/20/2017 1425   K 3.2 (L) 07/03/2018 0302   CL 100 07/03/2018 0302   CO2 26 07/03/2018 0302   BUN <5 (L) 07/03/2018 0302   BUN 10 04/20/2017 1425   CREATININE 0.53 07/03/2018 0302      Component Value Date/Time   CALCIUM 8.7 (L) 07/03/2018 0302   ALKPHOS 77 07/03/2018  0302   AST 22 07/03/2018 0302   ALT 38 07/03/2018 0302   BILITOT 0.6 07/03/2018 0302   BILITOT <0.2 05/30/2017 1010       Assessment and Plan: 1. Mild alcohol abuse in early remission Commended her from abstaining since discharge.  She will continue going to her group meetings which he finds helpful.  2. History of pancreatitis Symptoms of acute pancreatitis resolved.  Discussed the importance of remaining free of alcohol  3. Bilateral carpal tunnel syndrome Patient requested refill on Naprosyn which she takes as needed for pain in the hands from carpal tunnel syndrome - naproxen (NAPROSYN) 500 MG tablet; Take 1 tablet (500 mg total) by mouth 2 (two) times daily as needed.  Dispense: 40 tablet; Refill: 1   Follow Up Instructions: PRN  I discussed the assessment and treatment plan with the patient. The patient was provided an opportunity to ask questions and all were answered. The patient agreed with the plan and demonstrated an understanding of the instructions.   The patient was advised to call back or seek an in-person evaluation if the symptoms worsen or if the condition fails to improve as anticipated.  I provided 16 minutes of non-face-to-face time during this encounter.   Karle Plumber, MD

## 2018-09-18 DIAGNOSIS — O09529 Supervision of elderly multigravida, unspecified trimester: Secondary | ICD-10-CM | POA: Insufficient documentation

## 2018-09-19 ENCOUNTER — Encounter: Payer: Self-pay | Admitting: Family Medicine

## 2018-09-19 ENCOUNTER — Other Ambulatory Visit: Payer: Self-pay

## 2018-09-19 ENCOUNTER — Ambulatory Visit (INDEPENDENT_AMBULATORY_CARE_PROVIDER_SITE_OTHER): Payer: Medicaid Other | Admitting: Family Medicine

## 2018-09-19 ENCOUNTER — Other Ambulatory Visit (HOSPITAL_COMMUNITY)
Admission: RE | Admit: 2018-09-19 | Discharge: 2018-09-19 | Disposition: A | Discharge status: Home / Self Care | Payer: Medicaid Other | Source: Ambulatory Visit | Attending: Family Medicine | Admitting: Family Medicine

## 2018-09-19 VITALS — BP 107/72 | HR 103 | Temp 98.9°F | Wt 199.4 lb

## 2018-09-19 DIAGNOSIS — O09522 Supervision of elderly multigravida, second trimester: Secondary | ICD-10-CM | POA: Diagnosis not present

## 2018-09-19 DIAGNOSIS — Z3480 Encounter for supervision of other normal pregnancy, unspecified trimester: Secondary | ICD-10-CM | POA: Insufficient documentation

## 2018-09-19 DIAGNOSIS — O0990 Supervision of high risk pregnancy, unspecified, unspecified trimester: Secondary | ICD-10-CM

## 2018-09-19 DIAGNOSIS — O99210 Obesity complicating pregnancy, unspecified trimester: Secondary | ICD-10-CM

## 2018-09-19 DIAGNOSIS — Z789 Other specified health status: Secondary | ICD-10-CM | POA: Insufficient documentation

## 2018-09-19 DIAGNOSIS — O99211 Obesity complicating pregnancy, first trimester: Secondary | ICD-10-CM

## 2018-09-19 DIAGNOSIS — F329 Major depressive disorder, single episode, unspecified: Secondary | ICD-10-CM

## 2018-09-19 DIAGNOSIS — O09529 Supervision of elderly multigravida, unspecified trimester: Secondary | ICD-10-CM

## 2018-09-19 DIAGNOSIS — O99341 Other mental disorders complicating pregnancy, first trimester: Secondary | ICD-10-CM

## 2018-09-19 DIAGNOSIS — Z758 Other problems related to medical facilities and other health care: Secondary | ICD-10-CM

## 2018-09-19 DIAGNOSIS — K5900 Constipation, unspecified: Secondary | ICD-10-CM | POA: Insufficient documentation

## 2018-09-19 DIAGNOSIS — O26891 Other specified pregnancy related conditions, first trimester: Secondary | ICD-10-CM

## 2018-09-19 DIAGNOSIS — O0991 Supervision of high risk pregnancy, unspecified, first trimester: Secondary | ICD-10-CM

## 2018-09-19 DIAGNOSIS — N6310 Unspecified lump in the right breast, unspecified quadrant: Secondary | ICD-10-CM

## 2018-09-19 DIAGNOSIS — Z3A12 12 weeks gestation of pregnancy: Secondary | ICD-10-CM

## 2018-09-19 DIAGNOSIS — O09521 Supervision of elderly multigravida, first trimester: Secondary | ICD-10-CM | POA: Diagnosis not present

## 2018-09-19 DIAGNOSIS — F1021 Alcohol dependence, in remission: Secondary | ICD-10-CM

## 2018-09-19 DIAGNOSIS — K5901 Slow transit constipation: Secondary | ICD-10-CM

## 2018-09-19 MED ORDER — ASPIRIN EC 81 MG PO TBEC: 81.0000 mg | DELAYED_RELEASE_TABLET | Freq: Every day | ORAL | 3 refills | Status: DC

## 2018-09-19 MED ORDER — BLOOD PRESSURE KIT: PACK | 0 refills | Status: DC

## 2018-09-19 MED ORDER — DOCUSATE SODIUM 100 MG PO CAPS: 100.0000 mg | ORAL_CAPSULE | Freq: Two times a day (BID) | ORAL | 2 refills | Status: DC | PRN

## 2018-09-19 NOTE — Progress Notes (Signed)
Subjective:   Kelsey Swanson is a 48 y.o. [id number] at 31w1dby early ultrasound being seen today for her first obstetrical visit.  Her obstetrical history is significant for advanced maternal age, obesity and recent alcohol abuse. Patient does intend to breast feed. Pregnancy history fully reviewed.  Patient reports fatigue and nausea.  HISTORY: OB History  Gravida Para Term Preterm AB Living  _0 0 2 3  SAB TAB Ectopic Multiple Live Births  1 0 1 0 3    # Outcome Date GA Lbr Len/2nd Weight Sex Delivery Anes PTL Lv  6 Current           5 Term 12/16/15 [id number]:43 / 00:04 6 lb 1.7 oz (2.77 kg) F Vag-Spont None  LIV     Name: RUIZFLORES,GIRL Al     Apgar1: 9  Apgar5: 9  4 Ectopic           3 SAB           2 Term      Vag-Spont     1 Term      Vag-Spont      Last pap smear was  04/2018 and was normal Past Medical History:  Diagnosis Date  . Abnormal Pap smear 1996  . Fatty liver   . Headache   . Pancreatitis, chronic ([place]  . Vaginal Pap smear, abnormal    Past Surgical History:  Procedure Laterality Date  . BREAST EXCISIONAL BIOPSY Right   . BREAST SURGERY     right breast  . ECTOPIC PREGNANCY SURGERY  2012   Family History  Problem Relation Age of Onset  . Diabetes Father   . Diabetes Mother   . Diabetes Sister   . Diabetes Maternal Aunt   . Ovarian cancer Maternal Aunt   . Diabetes Maternal Grandmother   . Diabetes Maternal Uncle   . Breast cancer Neg Hx    Social History   Tobacco Use  . Smoking status: Former Smoker    Packs/day: 0.30    Years: 10.00    Pack years: 3.00    Types: Cigarettes    Quit date: 06/07/2015    Years since quitting: 3.2  . Smokeless tobacco: Never Used  Substance Use Topics  . Alcohol use: Yes    Comment: stopped in May  . Drug use: No   No Known Allergies Current Outpatient Medications on File Prior to Visit  Medication Sig Dispense Refill  . Ascorbic Acid (VITAMIN C) 1000 MG tablet Take 1,000 mg by mouth  daily.    . Multiple Vitamins-Minerals (ALIVE ONCE DAILY WOMENS) TABS Take 1 tablet by mouth daily.    . naproxen (NAPROSYN) 500 MG tablet Take 1 tablet (500 mg total) by mouth 2 (two) times daily as needed. (Patient not taking: Reported on 09/19/2018) 40 tablet 1  . pantoprazole (PROTONIX) 20 MG tablet Take 1 tablet (20 mg total) by mouth daily. (Patient not taking: Reported on 07/10/2018) 30 tablet 1   No current facility-administered medications on file prior to visit.      Exam   Vitals:   09/19/18 1336  BP: 107/72  Pulse: (!) 103  Temp: 98.9 F (37.2 C)  Weight: 199 lb 6.4 oz (90.4 kg)   Fetal Heart Rate (bpm): + on usKoreaUterus:  Fundal Height: 13 cm  Pelvic Exam: Perineum: no hemorrhoids, normal perineum   Vulva: normal external genitalia, no lesions   Vagina:  normal mucosa,  normal discharge   Cervix: no lesions and normal, pap smear done.    Adnexa: normal adnexa and no mass, fullness, tenderness   Bony Pelvis: average  System: General: well-developed, well-nourished female in no acute distress   Breast:  normal appearance, no masses or tenderness left, tender with small subcentimeter mass in right upper outer quadrant.   Skin: normal coloration and turgor, no rashes   Neurologic: oriented, normal, negative, normal mood   Extremities: normal strength, tone, and muscle mass, ROM of all joints is normal   HEENT PERRLA, extraocular movement intact and sclera clear, anicteric   Mouth/Teeth mucous membranes moist, pharynx normal without lesions and dental hygiene good   Neck supple and no masses   Cardiovascular: regular rate and rhythm   Respiratory:  no respiratory distress, normal breath sounds   Abdomen: soft, non-tender; bowel sounds normal; no masses,  no organomegaly     Assessment:   Pregnancy: [id number] Patient Active Problem List   Diagnosis Date Noted  . Supervision of high risk pregnancy, antepartum 09/19/2018  . Slow transit constipation 09/19/2018  .  Obesity in pregnancy with antepartum complication 14/78/2956  . AMA (advanced maternal age) multigravida 63+, unspecified trimester 09/18/2018  . Acute pancreatitis 07/02/2018  . Alcohol dependence in early, early partial, sustained full, or sustained partial remission ([place]) 07/01/2018  . Depression 07/01/2018  . Normocytic anemia 04/08/2017  . Acute alcoholic hepatitis 21/30/8657  . Thrombocytopenia ([place]) 04/07/2017  . Acute alcoholic pancreatitis 84/69/6295  . Fatty liver 07/23/2016  . Obesity (BMI 30.0-34.9) 07/23/2016  . AMA (advanced maternal age) multigravida 35+ 11/13/2015  . OBESITY, NOS 04/21/2006    Limited OB u/s for dates reveals SIUP with CRL 12 1/7 days, + flicker Plan:  1. Slow transit constipation Since pregnancy--stool softener given - docusate sodium (COLACE) 100 MG capsule; Take 1 capsule (100 mg total) by mouth 2 (two) times daily as needed.  Dispense: 30 capsule; Refill: 2  2. Alcohol dependence in early, early partial, sustained full, or sustained partial remission ([place]) In treatment--refrain from use during pregnancy--has h/o Fatty liver and acute pancreatitis related to alcohol use--to check LFTs  3. AMA (advanced maternal age) multigravida 41+, unspecified trimester Paternal age is 64--genetics referral - TSH - Hemoglobin A1c - Comprehensive metabolic panel - Protein / creatinine ratio, urine - aspirin EC 81 MG tablet; Take 1 tablet (81 mg total) by mouth daily.  Dispense: 90 tablet; Refill: 3 - Ambulatory referral to Genetics - [place] MFM OB DETAIL +14 WK; Future  4. Supervision of high risk pregnancy, antepartum - Enroll Patient in Babyscripts - Babyscripts Schedule Optimization - Obstetric Panel, Including HIV - Culture, OB Urine - Genetic Screening - Cervicovaginal ancillary only( [place]) - Blood Pressure KIT; Monitor BP at home regularly O09.90 large  Dispense: 1 kit; Refill: 0 - Obstetric Panel, Including HIV - Genetic Screening - US MFM OB  DETAIL +14 WK; Future  5. Obesity in pregnancy with antepartum complication    Initial labs drawn. Continue prenatal vitamins. Genetic Screening discussed, NIPS: ordered. Ultrasound discussed; fetal anatomic survey: ordered. Problem list reviewed and updated. The nature of [place] with multiple MDs and other Advanced Practice Providers was explained to patient; also emphasized that residents, students are part of our team. Routine obstetric precautions reviewed. Return in 4 weeks (on 10/17/2018).

## 2018-09-19 NOTE — Patient Instructions (Signed)
Lactancia materna Breastfeeding  Decidir Economist es una de las mejores elecciones que puede hacer por usted y su beb. Un cambio en las hormonas durante el embarazo hace que las mamas produzcan leche materna en las glndulas productoras de [place]. Las hormonas impiden que la leche materna sea liberada antes del nacimiento del beb. Adems, impulsan el flujo de leche luego del nacimiento. Una vez que ha comenzado a Economist, Freight forwarder beb, as Therapist, occupational succin o Social research officer, government, pueden estimular la liberacin de [place] de las glndulas productoras de [place]. Los beneficios de [employer] investigaciones demuestran que la lactancia materna ofrece muchos beneficios de salud para bebs y [place]. Adems, ofrece una forma gratuita y conveniente de Research scientist (life sciences) al beb. Para el beb  La primera leche (calostro) ayuda a Garment/textile technologist funcionamiento del aparato digestivo del beb.  Las clulas especiales de la leche (anticuerpos) ayudan a Radio broadcast assistant las infecciones en el beb.  Los bebs que se alimentan con leche materna tambin tienen menos probabilidades de tener asma, alergias, obesidad o diabetes de tipo 2. Adems, tienen menor riesgo de sufrir el sndrome de muerte sbita del lactante (SMSL).  [place] son mejores para Engineer, water las necesidades del beb en comparacin con la Humana Inc.  La leche materna mejora el desarrollo cerebral del beb. Para usted  La lactancia materna favorece el desarrollo de un vnculo muy especial entre la madre y el beb.  Es conveniente. La leche materna es econmica y siempre est disponible a la Tree surgeon.  La lactancia materna ayuda a quemar caloras. Wyatt Mage a perder el peso ganado durante el [place].  Hace que el tero vuelva al tamao que tena antes del embarazo ms rpido. Adems, disminuye el sangrado (loquios) despus del parto.  La lactancia materna contribuye a reducir Catering manager de tener diabetes de tipo 2,  osteoporosis, artritis reumatoide, enfermedades cardiovasculares y cncer de mama, ovario, tero y endometrio en el futuro. Informacin bsica sobre la lactancia Comienzo de la lactancia  Encuentre un lugar cmodo para sentarse o Acupuncturist, con un buen respaldo para el cuello y la espalda.  Coloque una almohada o una manta enrollada debajo del beb para acomodarlo a la altura de la mama (si est sentada). Las almohadas para Economist se han diseado especialmente a fin de servir de apoyo para los brazos y el beb Kellogg.  Asegrese de que la barriga del beb (abdomen) est frente a la suya.  [place] suavemente la mama. Con las yemas de los dedos, Apple Computer bordes exteriores de la mama hacia adentro, en direccin al pezn. Esto estimula el flujo de [place]. Si la [employer], es posible que deba Clinical biochemist con este movimiento durante la Transport planner.  Sostenga la mama con 4 dedos por debajo y Counselling psychologist por arriba del pezn (forme la letra C con la mano). Asegrese de que los dedos se encuentren lejos del pezn y de la boca del beb.  Empuje suavemente los labios del beb con el pezn o con el dedo.  Cuando la boca del beb se abra lo suficiente, acrquelo rpidamente a la mama e introduzca todo el pezn y la arola, tanto como sea posible, dentro de la boca del beb. La arola es la zona de color que rodea al pezn. ? Debe haber ms arola visible por arriba del labio superior del beb que por debajo del labio inferior. ? Los labios del beb deben estar abiertos y extendidos hacia afuera (evertidos) para Electrical engineer  que el beb se prenda de forma [place] y cmoda. ? La lengua del beb debe estar entre la enca inferior y Scientist, research (medical).  Asegrese de que la boca del beb est en la posicin correcta alrededor del pezn (prendido). Los labios del beb deben crear un sello sobre la mama y estar doblados hacia afuera (invertidos).  Es comn que el beb succione durante 2 a 3 minutos  para que comience el flujo de [place]. Cmo debe prenderse Es muy importante que le ensee al beb cmo prenderse adecuadamente a la mama. Si el beb no se prende adecuadamente, puede causar DTE Energy Company, reducir la produccin de [place] materna y Field seismologist que el beb tenga un escaso aumento de [place]. Adems, si el beb no se prende adecuadamente al pezn, puede tragar aire durante la alimentacin. Esto puede causarle molestias al beb. Hacer eructar al beb al Eliezer Lofts de mama puede ayudarlo a liberar el aire. Sin embargo, ensearle al beb cmo prenderse a la mama adecuadamente es la mejor manera de evitar que se sienta molesto por tragar Administrator, sports se alimenta. Signos de que el beb se ha prendido adecuadamente al pezn  Tironea o succiona de modo silencioso, sin Education administrator. Los labios del beb deben estar extendidos hacia afuera (evertidos).  Se escucha que traga cada 3 o 4 succiones una vez que la Northeast Utilities ha comenzado a Airline pilot (despus de que se produzca el reflejo de eyeccin de la [place]).  Hay movimientos musculares por arriba y por delante de sus odos al Mining engineer. Signos de que el beb no se ha prendido Product manager al pezn  Hace ruidos de succin o de chasquido mientras se Haematologist.  Siente dolor en los pezones. Si cree que el beb no se prendi correctamente, deslice el dedo en la comisura de la boca y Micron Technology las encas del beb para interrumpir la succin. Intente volver a comenzar a Economist. Signos de Transport planner materna exitosa Signos del beb  El beb disminuir gradualmente el nmero de succiones o dejar de succionar por completo.  El beb se quedar dormido.  El cuerpo del beb se relajar.  El beb retendr Ardelia Mems pequea cantidad de [employer]TEL Corporation boca.  El beb se desprender solo del [place]. Signos que presenta usted  Las mamas han aumentado la firmeza, el peso y el tamao 1 a 3 horas despus de Economist.  Estn ms blandas inmediatamente despus  de amamantar.  Se producen un aumento del volumen de [place] y un cambio en su consistencia y color [place].  Los pezones no duelen, no estn agrietados ni sangran. Signos de que su beb recibe la cantidad de leche suficiente  Mojar por lo menos 1 o 2paales durante las primeras 24horas despus del nacimiento.  Mojar por lo menos 5 o 6paales cada 24horas durante la primera semana despus del nacimiento. La orina debe ser clara o de color amarillo plido a los 5das de vida.  Mojar entre 6 y 8paales cada 24horas a medida que el beb sigue creciendo y desarrollndose.  Defeca por lo menos 3 veces en 24 horas a los 5 das de vida. Las heces deben ser blandas y Careers adviser.  Defeca por lo menos 3 veces en 24 horas a los [address] de vida. Las heces deben ser grumosas y Careers adviser.  No registra una prdida de peso mayor al 10% del peso al nacer durante los primeros [place].  Aumenta de peso un promedio de 4  a 7onzas (113 a 198g) por semana despus de los [place].  Aumenta de [place], [place], de [place] uniforme a Proofreader de los 5 das de vida, sin Museum/gallery curator prdida de peso despus de las 2semanas de vida. Despus de alimentarse, es posible que el beb regurgite una pequea cantidad de [place]. Esto es normal. Frecuencia y duracin de la lactancia El amamantamiento frecuente la ayudar a producir ms [place] y puede prevenir dolores en los pezones y las mamas extremadamente llenas (congestin [place]). Alimente al beb cuando muestre signos de hambre o si siente la necesidad de reducir la congestin de las [place]. Esto se denomina "lactancia a demanda". Las seales de que el beb tiene hambre incluyen las siguientes:  Aumento del [place] de [place], [place] o inquietud.  Mueve la cabeza de un lado a otro.  Abre la boca cuando se le toca la mejilla o la comisura de la boca (reflejo de bsqueda).  [place], tales como sonidos de  succin, se relame los labios, emite arrullos, suspiros o chirridos.  Mueve la [employer] boca y se chupa los dedos o las manos.  Est molesto o llora. Evite el uso del chupete en las primeras 4 a 6 semanas despus del nacimiento del beb. Despus de este perodo, podr usar un chupete. Las investigaciones demostraron que el uso del chupete durante Software engineer ao de vida del beb disminuye el riesgo de tener el sndrome de muerte sbita del lactante (SMSL). Permita que el nio se alimente en cada mama todo lo que desee. Cuando el beb se desprende o se queda dormido mientras se est alimentando de la primera mama, ofrzcale la segunda. Debido a que, con frecuencia, los recin nacidos estn somnolientos las primeras semanas de vida, es posible que deba despertar al beb para alimentarlo. Los horarios de Writer de un beb a otro. Sin embargo, las siguientes reglas pueden servir como gua para ayudarla a Engineer, materials que el beb se alimenta adecuadamente:  Se puede amamantar a los recin nacidos (bebs de 4 semanas o menos de vida) cada 1 a 3 horas.  No deben transcurrir ms de 3 horas durante el da o 5 horas durante la noche sin que se amamante a los recin nacidos.  Debe amamantar al beb un mnimo de 8 veces en un perodo de 24 horas. Extraccin de Owens Corning extraccin y Recruitment consultant de la leche materna le permiten asegurarse de que el beb se alimente exclusivamente de su leche materna, aun en momentos en los que no puede Economist. Esto tiene especial importancia si debe regresar al [place] en el perodo en que an est amamantando o si no puede estar presente en los momentos en que el beb debe alimentarse. Su asesor en lactancia puede ayudarla a Pension scheme manager un mtodo de extraccin que funcione mejor para usted y Aeronautical engineer cunto tiempo es [place]. Cmo cuidar las mamas durante la lactancia Los pezones pueden secarse, Medical illustrator y doler  durante la Transport planner. Las siguientes recomendaciones pueden ayudarla a Theatre manager las [employer] y sanas:  Art therapist usar jabn en los pezones.  Use un sostn de soporte diseado especialmente para la lactancia materna. Evite usar sostenes con aro o sostenes muy ajustados (sostenes deportivos).  Seque al aire sus pezones durante 3 a 42minutos despus de amamantar al beb.  Utilice solo apsitos de Chiropodist sostn para Tax adviser las prdidas de [place]. La prdida de un poco de Regions Financial Corporation  las tomas es normal.  Utilice lanolina sobre los pezones luego de Economist. La lanolina ayuda a mantener la humedad normal de la piel. La lanolina pura no es perjudicial (no es txica) para el beb. Adems, puede extraer Cisco algunas gotas de [place] materna y Community education officer suavemente esa Express Scripts pezones para que la [place] se seque al aire. Durante las primeras semanas despus del nacimiento, algunas mujeres experimentan [place]. La congestin The Pepsi puede hacer que sienta las mamas pesadas, calientes y sensibles al tacto. El pico de la congestin mamaria ocurre en el plazo de los 3 a 5 das despus del [place]. Las siguientes recomendaciones pueden ayudarla a Public house manager la congestin mamaria:  Vace por completo las mamas al [place]. Puede aplicar calor hmedo en las mamas (en la ducha o con toallas hmedas para manos) antes de Economist o extraer Northeast Utilities. Esto aumenta la circulacin y [place] a que la [place]. Si el beb no vaca por completo las mamas cuando lo amamanta, extraiga la [place] restante despus de que haya finalizado.  Aplique compresas de hielo E[employer] inmediatamente despus de Economist o extraer [place], a menos que le resulte demasiado incmodo. Haga lo siguiente: ? Ponga el hielo en una bolsa plstica. ? Coloque una Genuine Parts piel y la bolsa de hielo. ? Coloque el hielo durante 49minutos, 2 o 3veces por da.  Asegrese de que el beb  est prendido y se encuentre en la posicin correcta mientras lo alimenta. Si la congestin mamaria persiste luego de 48 horas o despus de seguir estas recomendaciones, comunquese con su mdico o un Lobbyist. Recomendaciones de salud general durante la lactancia  Consuma 3 comidas y 3 colaciones [place]. Las Toll Brothers bien alimentadas que amamantan necesitan entre 450 y [place] por Training and development officer. Puede cumplir con este requisito al aumentar la cantidad de una dieta equilibrada que realice.  Beba suficiente agua para mantener la orina clara o de color amarillo plido.  Descanse con frecuencia, reljese y siga tomando sus vitaminas prenatales para prevenir la fatiga, el estrs y los niveles bajos de vitaminas y Boston Scientific en el cuerpo (deficiencias de nutrientes).  No consuma ningn producto que contenga nicotina o tabaco, como cigarrillos y Psychologist, sport and exercise. El beb puede verse afectado por las sustancias qumicas de los cigarrillos que pasan a la [place] materna y por la exposicin al humo ambiental del tabaco. Si necesita ayuda para dejar de fumar, consulte al mdico.  Evite el consumo de alcohol.  No consuma drogas ilegales o marihuana.  Antes de Engineer, manufacturing systems, hable con el mdico. Estos incluyen medicamentos recetados y de [place], como tambin vitaminas y suplementos a base de hierbas. Algunos medicamentos, que pueden ser perjudiciales para el beb, pueden pasar a travs de la SLM Corporation.  Puede quedar embarazada durante la lactancia. Si se desea un mtodo anticonceptivo, consulte al mdico sobre cules son [place]. Dnde encontrar ms informacin: Liga internacional [place]: [email or web address]. Comunquese con un mdico si:  Siente que quiere dejar de Economist o se siente frustrada con la lactancia.  Sus pezones estn agrietados o Control and instrumentation engineer.  Sus mamas estn irritadas, sensibles o  calientes.  Tiene los siguientes sntomas: ? Dolor en las mamas o en los pezones. ? Un rea hinchada en cualquiera de las mamas. ? Cristy Hilts o escalofros. ? Nuseas o vmitos. ? Drenaje de otro lquido distinto de la Northeast Utilities materna desde los pezones.  Sus mamas no  se llenan antes de amamantar al beb para el quinto da despus del [place].  Se siente triste y deprimida.  El beb: ? Est demasiado somnoliento como para comer bien. ? Tiene problemas para dormir. ? Tiene ms de 1 semana de vida y Albertson's de 6 paales en un periodo de 24 horas. ? No ha aumentado de peso a los [place].  El beb defeca menos de 3 veces en 24 horas.  La piel del beb o las partes blancas de los ojos se vuelven amarillentas. Solicite ayuda de inmediato si:  El beb est muy cansado Engineer, manufacturing) y no se quiere despertar para comer.  Le sube la fiebre sin causa. Resumen  La lactancia materna ofrece muchos beneficios de salud para bebs y [place].  Intente amamantar a su beb cuando muestre signos tempranos de hambre.  Haga cosquillas o empuje suavemente los labios del beb con el dedo o el pezn para lograr que el beb abra la boca. Acerque el beb a la mama. Asegrese de que la mayor parte de la arola se encuentre dentro de la boca del beb. Ofrzcale una mama y haga eructar al beb antes de pasar a la otra.  Hable con su mdico o asesor en lactancia si tiene dudas o problemas con la lactancia. Esta informacin no tiene Marine scientist el consejo del mdico. Asegrese de hacerle al mdico cualquier pregunta que tenga. Document Released: 02/08/2005 Document Revised: 05/05/2017 Document Reviewed: 05/31/2016 Elsevier Patient Education  2020 Reynolds [employer].

## 2018-09-19 NOTE — Addendum Note (Signed)
Addended by: Tamela Oddi on: 09/19/2018 03:36 PM   Modules accepted: Orders

## 2018-09-20 ENCOUNTER — Telehealth: Payer: Self-pay | Admitting: Clinical

## 2018-09-20 NOTE — Telephone Encounter (Signed)
Attempt to follow-up with patient, per Dr. Kennon Rounds, for community resources and alcohol treatment options. Left HIPPA-compliant message to call back Roselyn Reef from [facility] [employer] at [phone], via [place] interpreter [place], [phone].

## 2018-09-21 ENCOUNTER — Ambulatory Visit: Payer: Medicaid Other | Admitting: Clinical

## 2018-09-21 ENCOUNTER — Institutional Professional Consult (permissible substitution): Payer: Medicaid Other

## 2018-09-21 LAB — PROTEIN / CREATININE RATIO, URINE
Creatinine, Urine: 44.8 mg/dL
Protein, Ur: <4 mg/dL

## 2018-09-21 LAB — OBSTETRIC PANEL, INCLUDING HIV
Antibody Screen: NEGATIVE
Basophils Absolute: 0 10*3/uL (ref 0.0–0.2)
Basos: 0 %
EOS (ABSOLUTE): 0.1 10*3/uL (ref 0.0–0.4)
Eos: 1 %
HIV Screen 4th Generation wRfx: NONREACTIVE
Hematocrit: 37.9 % (ref 34.0–46.6)
Hemoglobin: 12.6 g/dL (ref 11.1–15.9)
Hepatitis B Surface Ag: NEGATIVE
Immature Grans (Abs): 0 10*3/uL (ref 0.0–0.1)
Immature Granulocytes: 1 %
Lymphocytes Absolute: 2.3 10*3/uL (ref 0.7–3.1)
Lymphs: 27 %
MCH: 28.6 pg (ref 26.6–33.0)
MCHC: 33.2 g/dL (ref 31.5–35.7)
MCV: 86 fL (ref 79–97)
Monocytes Absolute: 0.4 10*3/uL (ref 0.1–0.9)
Monocytes: 5 %
Neutrophils Absolute: 5.7 10*3/uL (ref 1.4–7.0)
Neutrophils: 66 %
Platelets: 237 10*3/uL (ref 150–450)
RBC: 4.4 x10E6/uL (ref 3.77–5.28)
RDW: 12.2 % (ref 11.7–15.4)
RPR Ser Ql: NONREACTIVE
Rh Factor: POSITIVE
Rubella Antibodies, IGG: 15.7 index (ref >0.99)
WBC: 8.7 10*3/uL (ref 3.4–10.8)

## 2018-09-21 LAB — COMPREHENSIVE METABOLIC PANEL
ALT: 11 IU/L (ref 0–32)
AST: 12 IU/L (ref 0–40)
Albumin/Globulin Ratio: 1.8 (ref 1.2–2.2)
Albumin: 4.3 g/dL (ref 3.8–4.8)
Alkaline Phosphatase: 56 IU/L (ref 39–117)
BUN/Creatinine Ratio: 22 (ref 9–23)
BUN: 11 mg/dL (ref 6–24)
Bilirubin Total: <0.2 mg/dL (ref 0.0–1.2)
CO2: 19 mmol/L — ABNORMAL LOW (ref 20–29)
Calcium: 9.2 mg/dL (ref 8.7–10.2)
Chloride: 101 mmol/L (ref 96–106)
Creatinine, Ser: 0.5 mg/dL — ABNORMAL LOW (ref 0.57–1.00)
GFR calc Af Amer: 135 mL/min/{1.73_m2} (ref >59)
GFR calc non Af Amer: 117 mL/min/{1.73_m2} (ref >59)
Globulin, Total: 2.4 g/dL (ref 1.5–4.5)
Glucose: 94 mg/dL (ref 65–99)
Potassium: 4.1 mmol/L (ref 3.5–5.2)
Sodium: 137 mmol/L (ref 134–144)
Total Protein: 6.7 g/dL (ref 6.0–8.5)

## 2018-09-21 LAB — HEMOGLOBIN A1C
Est. average glucose Bld gHb Est-mCnc: 114 mg/dL
Hgb A1c MFr Bld: 5.6 % (ref 4.8–5.6)

## 2018-09-21 LAB — CERVICOVAGINAL ANCILLARY ONLY
Bacterial vaginitis: NEGATIVE
Candida vaginitis: NEGATIVE
Chlamydia: NEGATIVE
Neisseria Gonorrhea: NEGATIVE
Trichomonas: NEGATIVE

## 2018-09-21 LAB — CULTURE, OB URINE

## 2018-09-21 LAB — TSH: TSH: 1.52 u[IU]/mL (ref 0.450–4.500)

## 2018-09-21 LAB — URINE CULTURE, OB REFLEX

## 2018-09-21 NOTE — BH Specialist Note (Signed)
error 

## 2018-09-21 NOTE — BH Specialist Note (Addendum)
Integrated Behavioral Health via Telemedicine Video Visit  09/21/2018 Kelsey Swanson [id number]  Number of [place] visits: 1 Session Start time: 8:15  Session End time: 9:11 Total time: 1 hour  Referring Provider: Darron Doom, MD Type of Visit: Video Patient/Family location: Home [facility] Provider location: [facility] All persons participating in visit: Patient Kelsey Swanson and [place], and [place] interpreter Hoyle Sauer, [place]  Confirmed patient's address: Yes  Confirmed patient's phone number: Yes  Any changes to demographics: No   Confirmed patient's insurance: Yes  Any changes to patient's insurance: No   Discussed confidentiality: Yes   I connected with Kelsey Swanson a video enabled telemedicine application and verified that I am speaking with the correct person using two identifiers.     I discussed the limitations of evaluation and management by telemedicine and the availability of in person appointments.  I discussed that the purpose of this visit is to provide behavioral health care while limiting exposure to the novel coronavirus.   Discussed there is a possibility of technology failure and discussed alternative modes of communication if that failure occurs.  I discussed that engaging in this video visit, they consent to the provision of behavioral healthcare and the services will be billed under their insurance.  Patient and/or legal guardian expressed understanding and consented to video visit: Yes   PRESENTING CONCERNS: Patient and/or family reports the following symptoms/concerns: Pt states her primary concern today is worry over baby, as she drank alcohol prior to positive pregnancy, and experienced depression after her last pregnancy. Pt did not drink alcohol for a full year prior to March 2020, and resumed March-May 2020, until pregnancy.  Duration of problem: Current pregnancy; Severity of problem: mild  STRENGTHS (Protective  Factors/Coping Skills): Family support; open to learn  GOALS ADDRESSED: Patient will: 1.  Maintain reduction of symptoms of: anxiety and depression  2.  Increase knowledge and/or ability of: healthy habits  3.  Demonstrate ability to: Decrease self-medicating behaviors  INTERVENTIONS: Interventions utilized:  Motivational Interviewing and Psychoeducation and/or Health Education Standardized Assessments completed: AUDIT, GAD-7 and PHQ 9  ASSESSMENT: Patient currently experiencing Major depressive disorder in remission; Alcohol use disorder in remission  Patient may benefit from psychoeducation and brief therapeutic interventions regarding preventing symptoms of depression and brief alcohol use assessment.  Marland Kitchen  PLAN: 1. Follow up with behavioral health clinician on : As needed 2. Behavioral recommendations:  -Continue taking prenatal vitamin daily -Prioritize healthy sleep nightly -Contact MyChart Help Desk, if needed, to set up MyChart account, prior to expiration date on 10/07/2018 (daughter will help with this) 3. Referral(s): [place] (In Clinic) and [employer] Resources:  Alcohol treatment, as needed, in future  I discussed the assessment and treatment plan with the patient and/or parent/guardian. They were provided an opportunity to ask questions and all were answered. They agreed with the plan and demonstrated an understanding of the instructions.   They were advised to call back or seek an in-person evaluation if the symptoms worsen or if the condition fails to improve as anticipated.  Caroleen Hamman Kelsey Swanson

## 2018-09-25 ENCOUNTER — Other Ambulatory Visit: Payer: Self-pay

## 2018-09-25 ENCOUNTER — Ambulatory Visit (INDEPENDENT_AMBULATORY_CARE_PROVIDER_SITE_OTHER): Payer: Medicaid Other | Admitting: Clinical

## 2018-09-25 ENCOUNTER — Encounter: Payer: Self-pay | Admitting: Family Medicine

## 2018-09-25 DIAGNOSIS — F1011 Alcohol abuse, in remission: Secondary | ICD-10-CM

## 2018-09-25 DIAGNOSIS — F3342 Major depressive disorder, recurrent, in full remission: Secondary | ICD-10-CM

## 2018-09-25 NOTE — Patient Instructions (Signed)
For crisis assistance, 24/7:   [facility] Mobile Crisis: [place] Access to Care line:  [phone] [facility]: [address], [place], [facility] Recovery: [address], [place], [place]: [place], [place]  [facility]/Harm reduction:  [facility]: [place], [place], [place] locations  *All locations, call [phone]  [address], [place], [place] 1-7 Mondays and Tuesdays 4pm-8pm Thursdays 1pm-8pm Fridays 4pm-6pm Sundays [facility]: [address] Wed 2-5, Thurs 3-8 [phone] [email or web address]  *All Services are Free Primary school teacher, Fentanyl test kits, access to Narcan and training, access to [employer] and methadone, case management, referrals, nurse counseling groups)                                                                    [place] ADS: [facility]: [address]. [phone] www.[email or web address]  *Subsidized costs available   [facility]: [place] 9JT-7SV, Tues & Thurs 9am-8pm, Wed & Fri 9am-4pm, [place] 9am-1pm  [phone] [email or web address]   [facility]: [address] [phone]/ Fax: [phone] [email or web address] [facility]: [address], [place], [place] 24/7 [place] Hours [phone] Helpline: [phone]/ Fax: [phone] [email or web address] *Interpreters Available *Accepts Medicare, Medicaid, and uninsured patients   [facility]: [address] [place], [place] Mon-Fri 5am-10am, [place] 6am-8:30am, [place] [phone]/ Fax: [phone] [facility]: [phone] [email or web address] *Accepts  Medicaid   [facility]: [address] [place], [place] BombUnit.ch   [facility] [place] Overdose Loss [facility] Please call Bradd Burner at [phone] for: Date, time, location, brief intake interview, & registration. [email or web address]  [facility]: [address], [place] Fax: [phone]   [email or web address]  [facility]: [address] [place], [place], [place] [email or web address]   [facility]: [place], [place], [place] Mon, [place], Fri: 9:00am-9:00pm; [place]: 9:00am-6:00pm [phone]  [email or web address]  *Habla Espanol   [facility]: [address], [place], [place] Mon-Thurs 9am-6pm, Fri 9am-2pm [phone]  [email or web address]://[email or web address] *Accepts Medicaid  [place]: [address] [place], [place], [place] fax) [phone]  [email or web address]    *Accepts Medicaid  [facility]: [place], [place], [place], [place] / Fax: [phone]  [email or web address]                                                               [place] ADS: [facility]: [address] [place], [place] fax: [place]: [address], [place], [place] Mon-Fri [place] [phone]/ Fax: [phone]  [email or web address] *Accepts Medicare, Medicaid  [facility]: [address], [place] [email or web address]: [facility]: [address] [place], [place]  [place].: [place], [place], [place] [address] Phone: [phone] [email or web address] *Accepts [facility]                                                                 [facility] : [address], [place], [place]  [phone]/ [phone] [email or web address] *Accepts Medicaid                                                        [place].: [address] [place] or [place] Medicaid [place]: [address] [place] [email or web address]  *Accepts Medicare, Medicaid    These referrals have been provided to you as appropriate for your clinical needs while taking into account your financial concerns. Please be aware that agencies, practitioners and insurance companies sometimes change contracts. When calling to make an appointment have your insurance information available so the professional you are going to see can confirm whether they are covered by your plan. Take this form with you in case the person you are seeing needs a copy or to contact us.

## 2018-09-28 ENCOUNTER — Encounter: Payer: Self-pay | Admitting: Family Medicine

## 2018-10-17 ENCOUNTER — Ambulatory Visit (INDEPENDENT_AMBULATORY_CARE_PROVIDER_SITE_OTHER): Payer: Medicaid Other | Admitting: Obstetrics & Gynecology

## 2018-10-17 ENCOUNTER — Other Ambulatory Visit (HOSPITAL_COMMUNITY): Payer: Medicaid Other

## 2018-10-17 ENCOUNTER — Encounter: Payer: Medicaid Other | Admitting: Obstetrics & Gynecology

## 2018-10-17 ENCOUNTER — Encounter (HOSPITAL_COMMUNITY): Payer: Medicaid Other

## 2018-10-17 DIAGNOSIS — O0990 Supervision of high risk pregnancy, unspecified, unspecified trimester: Secondary | ICD-10-CM

## 2018-10-17 DIAGNOSIS — O99210 Obesity complicating pregnancy, unspecified trimester: Secondary | ICD-10-CM

## 2018-10-17 DIAGNOSIS — Z758 Other problems related to medical facilities and other health care: Secondary | ICD-10-CM

## 2018-10-17 DIAGNOSIS — O09522 Supervision of elderly multigravida, second trimester: Secondary | ICD-10-CM

## 2018-10-17 DIAGNOSIS — O99212 Obesity complicating pregnancy, second trimester: Secondary | ICD-10-CM

## 2018-10-17 DIAGNOSIS — E669 Obesity, unspecified: Secondary | ICD-10-CM

## 2018-10-17 DIAGNOSIS — O0992 Supervision of high risk pregnancy, unspecified, second trimester: Secondary | ICD-10-CM

## 2018-10-17 DIAGNOSIS — F1011 Alcohol abuse, in remission: Secondary | ICD-10-CM | POA: Diagnosis not present

## 2018-10-17 DIAGNOSIS — L299 Pruritus, unspecified: Secondary | ICD-10-CM

## 2018-10-17 DIAGNOSIS — Z789 Other specified health status: Secondary | ICD-10-CM

## 2018-10-17 DIAGNOSIS — E66811 Obesity, class 1: Secondary | ICD-10-CM

## 2018-10-17 DIAGNOSIS — Z3A16 16 weeks gestation of pregnancy: Secondary | ICD-10-CM

## 2018-10-17 DIAGNOSIS — O09529 Supervision of elderly multigravida, unspecified trimester: Secondary | ICD-10-CM

## 2018-10-17 NOTE — Progress Notes (Addendum)
[facility] VIRTUAL VIDEO VISIT ENCOUNTER NOTE  Provider location: [facility] [place] [facility] [place]   I connected with Kelsey Swanson on 10/17/18 at  2:15 PM EDT by telephone Encounter at home and verified that I am speaking with the correct person using two identifiers.   I discussed the limitations, risks, security and privacy concerns of performing an evaluation and management service virtually and the availability of in person appointments. I also discussed with the patient that there may be a patient responsible charge related to this service. The patient expressed understanding and agreed to proceed. Subjective:  Kelsey Swanson is a 48 y.o. [phone] at [redacted]w[redacted]d being seen today for ongoing prenatal care.  She is currently monitored for the following issues for this high-risk pregnancy and has OBESITY, NOS; AMA (advanced maternal age) multigravida 30+; Fatty liver; Obesity (BMI 30.0-34.9); Acute alcoholic hepatitis; Thrombocytopenia ([place]); Acute alcoholic pancreatitis; Normocytic anemia; Alcohol dependence in early, early partial, sustained full, or sustained partial remission ([place]); Depression; Acute pancreatitis; AMA (advanced maternal age) multigravida 74+, unspecified trimester; Supervision of high risk pregnancy, antepartum; Slow transit constipation; Obesity in pregnancy with antepartum complication; Language barrier; and Mass of breast, right on their problem list.  Patient reports no complaints except itching  .  .   . Denies any leaking of fluid.   The following portions of the patient's history were reviewed and updated as appropriate: allergies, current medications, past family history, past medical history, past social history, past surgical history and problem list.   Objective:  There were no vitals filed for this visit.  Fetal Status:           General:  Alert, oriented and cooperative. Patient is in no acute distress.  Respiratory: Normal  respiratory effort, no problems with respiration noted  Mental Status: Normal mood and affect. Normal behavior. Normal judgment and thought content.  Rest of physical exam deferred due to type of encounter  Imaging: No results found.  Assessment and Plan:  Pregnancy: [id number] at [redacted]w[redacted]d 1. Alcohol use disorder, mild, in sustained remission - She denies alcohol use currently  2. AMA (advanced maternal age) multigravida 7+, unspecified trimester - normal NIPs - she has a MFM u/s and genetic counseling appt in 3 weeks  4. Supervision of high risk pregnancy, antepartum   5. Language barrier - interpretor used for exam  6. Obesity (BMI 30.0-34.9) - rec less than 20 pound weight gain total  7. Itching during pregnancy- check bile acids tomorrow  Preterm labor symptoms and general obstetric precautions including but not limited to vaginal bleeding, contractions, leaking of fluid and fetal movement were reviewed in detail with the patient. I discussed the assessment and treatment plan with the patient. The patient was provided an opportunity to ask questions and all were answered. The patient agreed with the plan and demonstrated an understanding of the instructions. The patient was advised to call back or seek an in-person office evaluation/go to [facility] at [facility] for any urgent or concerning symptoms. Please refer to After Visit Summary for other counseling recommendations.   I provided 11 minutes of face-to-face time during this encounter.  Return in about 4 weeks (around 11/14/2018) for in person.  Future Appointments  Date Time Provider [place]  10/18/2018  1:00 PM [place] MFC-US  10/18/2018  1:00 PM [place] 3 WH-MFCUS MFC-US  10/18/2018  2:00 PM WH-MFC GENETIC COUNSELING RM WH-MFC MFC-US    Emily Filbert, MD  [facility]

## 2018-10-17 NOTE — Addendum Note (Signed)
Addended by: Emily Filbert on: 10/17/2018 03:20 PM   Modules accepted: Orders

## 2018-10-17 NOTE — Progress Notes (Signed)
Pt states that she has not started feeling fetal movement, denies pain, states that she cannot download babyrx because she does not understand how and her daughter is not around to help her. Pt states that she has not received BP cuff yet, advised that distributor is behind on orders, so there is a delay.

## 2018-10-18 ENCOUNTER — Ambulatory Visit (HOSPITAL_COMMUNITY): Payer: MEDICAID

## 2018-10-18 ENCOUNTER — Other Ambulatory Visit: Payer: Medicaid Other

## 2018-10-18 ENCOUNTER — Ambulatory Visit (HOSPITAL_COMMUNITY): Payer: Medicaid Other

## 2018-10-23 ENCOUNTER — Other Ambulatory Visit: Payer: Self-pay

## 2018-10-23 ENCOUNTER — Other Ambulatory Visit: Payer: Medicaid Other

## 2018-10-23 DIAGNOSIS — L299 Pruritus, unspecified: Secondary | ICD-10-CM | POA: Diagnosis not present

## 2018-10-25 LAB — BILE ACIDS, TOTAL: Bile Acids Total: 2 umol/L (ref 0.0–10.0)

## 2018-10-26 ENCOUNTER — Telehealth: Payer: Self-pay | Admitting: Obstetrics

## 2018-11-01 ENCOUNTER — Ambulatory Visit (HOSPITAL_COMMUNITY): Payer: Medicaid Other | Admitting: *Deleted

## 2018-11-01 ENCOUNTER — Ambulatory Visit (HOSPITAL_BASED_OUTPATIENT_CLINIC_OR_DEPARTMENT_OTHER): Payer: Medicaid Other | Admitting: Genetic Counselor

## 2018-11-01 ENCOUNTER — Encounter (HOSPITAL_COMMUNITY): Payer: Self-pay

## 2018-11-01 ENCOUNTER — Other Ambulatory Visit (HOSPITAL_COMMUNITY): Payer: Self-pay | Admitting: *Deleted

## 2018-11-01 ENCOUNTER — Ambulatory Visit (HOSPITAL_COMMUNITY)
Admission: RE | Admit: 2018-11-01 | Discharge: 2018-11-01 | Disposition: A | Discharge status: Home / Self Care | Payer: Medicaid Other | Source: Ambulatory Visit | Attending: Obstetrics and Gynecology | Admitting: Obstetrics and Gynecology

## 2018-11-01 ENCOUNTER — Ambulatory Visit (HOSPITAL_COMMUNITY): Payer: Self-pay | Admitting: Genetic Counselor

## 2018-11-01 ENCOUNTER — Other Ambulatory Visit: Payer: Self-pay

## 2018-11-01 DIAGNOSIS — O99212 Obesity complicating pregnancy, second trimester: Secondary | ICD-10-CM

## 2018-11-01 DIAGNOSIS — Z315 Encounter for genetic counseling: Secondary | ICD-10-CM | POA: Diagnosis not present

## 2018-11-01 DIAGNOSIS — O2692 Pregnancy related conditions, unspecified, second trimester: Secondary | ICD-10-CM | POA: Diagnosis not present

## 2018-11-01 DIAGNOSIS — O99112 Other diseases of the blood and blood-forming organs and certain disorders involving the immune mechanism complicating pregnancy, second trimester: Secondary | ICD-10-CM

## 2018-11-01 DIAGNOSIS — Z148 Genetic carrier of other disease: Secondary | ICD-10-CM | POA: Diagnosis not present

## 2018-11-01 DIAGNOSIS — O09522 Supervision of elderly multigravida, second trimester: Secondary | ICD-10-CM

## 2018-11-01 DIAGNOSIS — Z3A18 18 weeks gestation of pregnancy: Secondary | ICD-10-CM

## 2018-11-01 DIAGNOSIS — O0990 Supervision of high risk pregnancy, unspecified, unspecified trimester: Secondary | ICD-10-CM | POA: Insufficient documentation

## 2018-11-01 DIAGNOSIS — O09529 Supervision of elderly multigravida, unspecified trimester: Secondary | ICD-10-CM | POA: Insufficient documentation

## 2018-11-01 DIAGNOSIS — D696 Thrombocytopenia, unspecified: Secondary | ICD-10-CM

## 2018-11-01 DIAGNOSIS — O99210 Obesity complicating pregnancy, unspecified trimester: Secondary | ICD-10-CM | POA: Insufficient documentation

## 2018-11-01 NOTE — Progress Notes (Signed)
11/01/2018  Kelsey Swanson 06/21/1970 MRN: [id number] DOV: 11/01/2018  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 [name] came to her appointment alone due to COVID-19 visitor restrictions. [name] was offered Spanish interpreting services to assist in facilitating the session, which she declined.  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 is a [id number], 48 y.o. female. Her current pregnancy has completed [redacted]w[redacted]d (Estimated Date of Delivery: 04/02/19).  [name] denied exposure to environmental toxins or chemical agents. She denied the use of alcohol, tobacco or street drugs. She reported taking prenatal vitamins. She denied significant viral illnesses, fevers, and bleeding during the course of her pregnancy. Her medical and surgical histories were noncontributory.  Family History  A three generation pedigree was drafted and reviewed. The family history is remarkable for the following:  - [name] has a daughter who had laryngomalacia in infancy. Laryngomalacia is a congenital softening of the larynx (voice box) above the vocal cords. Since the larynx is floppy, the tissues fall over the airway opening and partially block it. Laryngomalacia is the most common cause of stridor (noisy breathing) in infancy. In 90% of affected infants, laryngomalacia will resolve on its own. The underlying cause of the condition is unknown. Most cases occur sporadically in people with no family history of the condition. However, laryngomalacia may rarely be inherited. Only a few cases of familial laryngomalacia have been described in the literature. Given that no one else in [name]'s family has laryngomalacia, the risk of recurrence is likely low.  - [name]'s mother, maternal  aunts, and several maternal first cousins have diabetes. We discussed that many times, diabetes is multifactorial in nature, occurring due to a combination of genetic, lifestyle, and environmental factors. Diabetes can appear to run in families; thus, there is a chance that [name] and her children could also develop diabetes.  - [name]'s partner, Kelsey Swanson had a sister who passed away from breast cancer that was diagnosed at age 71. No one else in Mr. Kelsey Swanson's family has had cancer to [name]'s knowledge. We discussed that most cancers are thought to be sporadic or due to environmental factors. Given the lack of family history, an inherited cancer syndrome is unlikely in Mr. Kelsey Swanson's family. However, risk assessment was limited, as [name] had limited information about Mr. Devall's family history.  The remaining family histories were reviewed and found to be noncontributory for birth defects, intellectual disability, recurrent pregnancy loss, and known genetic conditions.    The patient's ethnicity is [place]. The father of the pregnancy's ethnicity is [place]. Ashkenazi Jewish ancestry and consanguinity were denied. Pedigree will be scanned under Media.  Discussion  Ms.Floreswas referred to genetic counseling for advanced maternal age, as she will be48years old at the time of delivery. At her age and during thesecondtrimester, there is approximately a 1 in11(9.1%) chance of having a child with a chromosomal abnormality. Her age-related risk to have a child with Down syndrome specifically is 1 in26(3.8%)in the second trimester. We briefly discussed features of trisomy 63 (Down syndrome), trisomy 31, and trisomy 82. These conditions often are not inherited, but instead occurdueto an error in chromosomal division during the formation of sperm and egg cells.  We reviewed that [name] had [place] NIPS through [facility] that was low-risk  for fetal aneuploidies. We reviewed that  these results showed a less than 1 in 10,000 risk for trisomies 21, 18 and 13, and monosomy X (Turner syndrome). In addition, the risk for triploidy and sex chromosome trisomies (47,XXX and 47,XXY) was also low. [name] elected to have cffDNA analysis for 22q11 deletion syndrome, which was also low risk (1 in 9000). These results significantly decrease the chances of the current fetus being affected by one of these conditions. We reviewed that while this testing identifies >99% of pregnancies with trisomy 72, trisomy 38, sex chromosome trisomies (47,XXX and 47,XXY), and triploidy, it is NOT diagnostic. A positive test result requires confirmation by CVS or amniocentesis, and a negative test result does not rule out a fetal chromosome abnormality. She also understands that this testing does not identify all genetic conditions.  Ms.Floreshad [place]-14carrier screeningperformedthrough [facility]. The results of the screendetected one normal sized CGG repeat (29 repeats) and an intermediate sized CGG repeat (53 repeats) for fragile X syndrome.  Fragile X syndrome is the most common inherited cause of intellectual disability. Fragile X syndrome is caused by mutations in the FMR1 gene on the X chromosome. Nearly all cases of fragile X syndrome are caused by mutations in the CGG trinucleotide repeat region of the FMR1 gene. Typically, the FMR1 gene contains 6 to 44 CGG trinucleotide repeats. Individuals with this number of repeats are not affected by fragile X syndrome. Individuals with fragile X syndrome have over 200 CGG repeats. This abnormally expanded CGG trinucleotide repeat segment silences the FMR1 gene, which prevents the production of the FMRP protein. Loss or deficiency of this protein leads to the symptoms associated with fragile X syndrome, including mild to moderate intellectual disability, developmental delays, autism, behavioral abnormalities, and characteristic physical features. Fragile X  syndrome usually affects males more severely than females, since males have one X chromosome and females have two.  Repeat sizes of 45-54 in the CGG trinucleotide segment of the FMR1 gene are considered to be intermediate sized repeats. Repeats of this size are also commonly referred to as the "gray zone". Individuals with repeat alleles that fall in the intermediate range are not at risk of having a child affected by fragile X syndrome, as an intermediate allele is not at risk of expanding to a full mutation (>200 repeats) in one generation. Individuals who have an allele that falls in the intermediate range do not experience any symptoms themselves.  Individuals with 55-200 CGG trinucleotide repeats in the FMR1 gene are said to be premutation carriers for fragile X syndrome. Individuals with this number of repeats are at risk for their children to inherit an expanded allele (>200 repeats) and potentially be affected by fragile X syndrome. Premutation carriers also may experience symptoms of their own, such as symptoms associated with fragile X-associated primary ovarian insufficiency (FXPOI) or fragile X-associated tremor/ataxia syndrome (FXTAS).  The largest known repeat size to expand into a full mutation is 56 repeats, which falls in the premutation range. [name] largest repeat size of 53 falls in the intermediate range, indicating that she is not at increased risk to have a child affected by fragile X syndrome. However, it is possible that an intermediate allele may expand into a premutation in the next generation; thus, [name]'s children may be at risk to have children of their own who are affected by fragile X syndrome.  [name] carrier screening was negative for the other 13 conditions screened. Thus, her risk to be a carrier for  these additional conditions (listed separately in the laboratory report) has been reduced but not eliminated.   A complete ultrasound was performed  today prior to our visit. The ultrasound report will be sent under separate cover. There were no visualized fetal anomalies or markers suggestive of aneuploidy.  [name] was also counseled regarding diagnostic testing via amniocentesis. We discussed the technical aspects of the procedure and quoted up to a 1 in 500 (0.2%) risk for spontaneous pregnancy loss or other adverse pregnancy outcomes as a result of amniocentesis. Cultured cells from an amniocentesis sample allow for the visualization of a fetal karyotype, which can detect >99% of chromosomal aberrations. Chromosomal microarray can also be performed to identify smaller deletions or duplications of fetal chromosomal material. After careful consideration, [name] declined amniocentesis at this time. She understands that amniocentesis is available at any point after 16 weeks of pregnancy and that she may opt to undergo the procedure at a later date should she change her mind.  Lastly, the patient was made aware that screening for open neural tube defects (ONTDs) via MS-AFP in the second trimester in addition to level II ultrasound examination is recommended. We reviewed that [name]'s level II ultrasound did not detect any ONTDs, and that level II ultrasound is able to detect them with 90-95% sensitivity. However, normal results from any of the above options do not guarantee a normal baby, as 3-5% of newborns have some type of birth defect, many of which are not prenatally diagnosable.  Additional screening and diagnostic testing were declined today. She understands that screening tests, including ultrasound, cannot rule out all birth defects or genetic syndromes. The patient was advised of this limitation and states she still does not want additional testing or screening at this time.   During our session, [name] expressed concerns about feeling faint and experiencing pain from a mass on her breast. She was also concerned about the effects  of her pancreatitis on her pregnancy. I encouraged [name] to discuss these concerns with her OBGYN provider at her next appointment, or to send her OBGYN's office a message via the MyChart app. [name] was agreeable to this.  I counseled [name] regarding the above risks and available options. The approximate face-to-face time with the genetic counselor was 25 minutes.  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

## 2018-11-04 DIAGNOSIS — O0990 Supervision of high risk pregnancy, unspecified, unspecified trimester: Secondary | ICD-10-CM | POA: Diagnosis not present

## 2018-11-06 ENCOUNTER — Telehealth: Payer: Self-pay | Admitting: Obstetrics and Gynecology

## 2018-11-06 NOTE — Telephone Encounter (Signed)
Patient called with complaint of breast pain and requesting a mammogram.  Patient is [redacted] weeks pregnant.  She denies fever or heat in her breasts.  Advised patient to keep her scheduled OB appointment for next week.  Advised patient to go to [facility] should she develop severe pain or fever presents.   Patient also stated she was told by nurse at MFM that she has hepatitis.  Reviewed OB labs and told her that her Hep B was negative and I did not see any other hepatitis screening labs.   MFM note does not reflect this finding.

## 2018-11-14 ENCOUNTER — Ambulatory Visit (INDEPENDENT_AMBULATORY_CARE_PROVIDER_SITE_OTHER): Payer: Medicaid Other | Admitting: Obstetrics & Gynecology

## 2018-11-14 ENCOUNTER — Encounter: Payer: Self-pay | Admitting: Obstetrics & Gynecology

## 2018-11-14 ENCOUNTER — Other Ambulatory Visit: Payer: Self-pay

## 2018-11-14 VITALS — BP 118/76 | HR 115 | Wt 206.9 lb

## 2018-11-14 DIAGNOSIS — Z789 Other specified health status: Secondary | ICD-10-CM

## 2018-11-14 DIAGNOSIS — O09522 Supervision of elderly multigravida, second trimester: Secondary | ICD-10-CM

## 2018-11-14 DIAGNOSIS — O0992 Supervision of high risk pregnancy, unspecified, second trimester: Secondary | ICD-10-CM

## 2018-11-14 DIAGNOSIS — N6310 Unspecified lump in the right breast, unspecified quadrant: Secondary | ICD-10-CM

## 2018-11-14 DIAGNOSIS — O0990 Supervision of high risk pregnancy, unspecified, unspecified trimester: Secondary | ICD-10-CM | POA: Diagnosis not present

## 2018-11-14 DIAGNOSIS — Z23 Encounter for immunization: Secondary | ICD-10-CM | POA: Diagnosis not present

## 2018-11-14 DIAGNOSIS — Z3A20 20 weeks gestation of pregnancy: Secondary | ICD-10-CM

## 2018-11-14 DIAGNOSIS — O09529 Supervision of elderly multigravida, unspecified trimester: Secondary | ICD-10-CM

## 2018-11-14 DIAGNOSIS — O99210 Obesity complicating pregnancy, unspecified trimester: Secondary | ICD-10-CM

## 2018-11-14 DIAGNOSIS — G5603 Carpal tunnel syndrome, bilateral upper limbs: Secondary | ICD-10-CM | POA: Insufficient documentation

## 2018-11-14 DIAGNOSIS — F1021 Alcohol dependence, in remission: Secondary | ICD-10-CM

## 2018-11-14 DIAGNOSIS — N6001 Solitary cyst of right breast: Secondary | ICD-10-CM

## 2018-11-14 DIAGNOSIS — O99212 Obesity complicating pregnancy, second trimester: Secondary | ICD-10-CM

## 2018-11-14 NOTE — Progress Notes (Signed)
Patient reports fetal movement with occasional pressure. 

## 2018-11-14 NOTE — Addendum Note (Signed)
Addended by: Lavonia Drafts on: 11/14/2018 02:27 PM   Modules accepted: Orders

## 2018-11-14 NOTE — Progress Notes (Addendum)
   PRENATAL VISIT NOTE  Subjective:  Kelsey Swanson is a 48 y.o. [phone] at [redacted]w[redacted]d being seen today for ongoing prenatal care.  She is currently monitored for the following issues for this high-risk pregnancy and has OBESITY, NOS; AMA (advanced maternal age) multigravida 38+; Fatty liver; Obesity (BMI 30.0-34.9); Acute alcoholic hepatitis; Thrombocytopenia ([place]); Acute alcoholic pancreatitis; Normocytic anemia; Alcohol dependence in early, early partial, sustained full, or sustained partial remission ([place]); Depression; Acute pancreatitis; AMA (advanced maternal age) multigravida 8+, unspecified trimester; Supervision of high risk pregnancy, antepartum; Slow transit constipation; Obesity in pregnancy with antepartum complication; Language barrier; and Mass of breast, right on their problem list.  Patient reports round ligament pain and nighttime hand numbness. .  Contractions: Not present. Vag. Bleeding: None.  Movement: Present. Denies leaking of fluid.   The following portions of the patient's history were reviewed and updated as appropriate: allergies, current medications, past family history, past medical history, past social history, past surgical history and problem list.   Objective:   Vitals:   11/14/18 1402  BP: 118/76  Pulse: (!) 115  Weight: 206 lb 14.4 oz (93.8 kg)    Fetal Status: Fetal Heart Rate (bpm): 162   Movement: Present     General:  Alert, oriented and cooperative. Patient is in no acute distress.  Skin: Skin is warm and dry. No rash noted.   Cardiovascular: Normal heart rate noted  Respiratory: Normal respiratory effort, no problems with respiration noted  Abdomen: Soft, gravid, appropriate for gestational age.  Pain/Pressure: Present     Pelvic: Cervical exam deferred        Extremities: Normal range of motion.  Edema: Trace  Mental Status: Normal mood and affect. Normal behavior. Normal judgment and thought content.   Assessment and Plan:  Pregnancy: [id number]  at [redacted]w[redacted]d 1. Supervision of high risk pregnancy, antepartum AFP today   2. Obesity in pregnancy with antepartum complication  3. Mass of breast, right schedule diagnostic mammogram   4. Language barrier Spanish interpreter used the entire visit.   5. Antepartum multigravida of advanced maternal age Recommendations  Given maternal age recommend serial growth exam with  weekly testing at 46  Consider delivery by 39 weeks  6. Alcohol dependence in early, early partial, sustained full, or sustained partial remission ([place]) No used reported since 1 month prior to the pregnancy  7. Bilateral carpal tunnel  Bilateral wrist splints   Preterm labor symptoms and general obstetric precautions including but not limited to vaginal bleeding, contractions, leaking of fluid and fetal movement were reviewed in detail with the patient. Please refer to After Visit Summary for other counseling recommendations.   No follow-ups on file.  Future Appointments  Date Time Provider [place]  11/29/2018  2:15 PM [place] NURSE [place] [facility]-US  11/29/2018  2:15 PM [place] 4 WH-MFCUS MFC-US    Lavonia Drafts, MD

## 2018-11-17 LAB — AFP, SERUM, OPEN SPINA BIFIDA
AFP MoM: 1.48
AFP Value: 69.5 ng/mL
Gest. Age on Collection Date: 20 weeks
Maternal Age At EDD: 46.3 yr
OSBR Risk 1 IN: 2886
Test Results:: NEGATIVE
Weight: 205 [lb_av]

## 2018-11-21 ENCOUNTER — Other Ambulatory Visit: Payer: Self-pay | Admitting: Obstetrics & Gynecology

## 2018-11-21 ENCOUNTER — Other Ambulatory Visit: Payer: Self-pay

## 2018-11-21 ENCOUNTER — Ambulatory Visit
Admission: RE | Admit: 2018-11-21 | Discharge: 2018-11-21 | Disposition: A | Discharge status: Home / Self Care | Payer: Medicaid Other | Source: Ambulatory Visit | Attending: Obstetrics & Gynecology | Admitting: Obstetrics & Gynecology

## 2018-11-21 DIAGNOSIS — N6311 Unspecified lump in the right breast, upper outer quadrant: Secondary | ICD-10-CM | POA: Diagnosis not present

## 2018-11-21 DIAGNOSIS — N6001 Solitary cyst of right breast: Secondary | ICD-10-CM

## 2018-11-21 DIAGNOSIS — N6310 Unspecified lump in the right breast, unspecified quadrant: Secondary | ICD-10-CM

## 2018-11-23 ENCOUNTER — Ambulatory Visit
Admission: RE | Admit: 2018-11-23 | Discharge: 2018-11-23 | Disposition: A | Discharge status: Home / Self Care | Payer: Medicaid Other | Source: Ambulatory Visit | Attending: Obstetrics & Gynecology | Admitting: Obstetrics & Gynecology

## 2018-11-23 ENCOUNTER — Other Ambulatory Visit: Payer: Self-pay

## 2018-11-23 ENCOUNTER — Other Ambulatory Visit: Payer: Self-pay | Admitting: Obstetrics & Gynecology

## 2018-11-23 DIAGNOSIS — N6310 Unspecified lump in the right breast, unspecified quadrant: Secondary | ICD-10-CM

## 2018-11-23 DIAGNOSIS — N6311 Unspecified lump in the right breast, upper outer quadrant: Secondary | ICD-10-CM | POA: Diagnosis not present

## 2018-11-23 DIAGNOSIS — D241 Benign neoplasm of right breast: Secondary | ICD-10-CM | POA: Diagnosis not present

## 2018-11-29 ENCOUNTER — Other Ambulatory Visit: Payer: Self-pay

## 2018-11-29 ENCOUNTER — Encounter (HOSPITAL_COMMUNITY): Payer: Self-pay

## 2018-11-29 ENCOUNTER — Other Ambulatory Visit (HOSPITAL_COMMUNITY): Payer: Self-pay | Admitting: *Deleted

## 2018-11-29 ENCOUNTER — Ambulatory Visit (HOSPITAL_COMMUNITY): Payer: Medicaid Other | Admitting: *Deleted

## 2018-11-29 ENCOUNTER — Ambulatory Visit (HOSPITAL_COMMUNITY)
Admission: RE | Admit: 2018-11-29 | Discharge: 2018-11-29 | Disposition: A | Discharge status: Home / Self Care | Payer: Medicaid Other | Source: Ambulatory Visit | Attending: Obstetrics and Gynecology | Admitting: Obstetrics and Gynecology

## 2018-11-29 DIAGNOSIS — D696 Thrombocytopenia, unspecified: Secondary | ICD-10-CM

## 2018-11-29 DIAGNOSIS — Z3A22 22 weeks gestation of pregnancy: Secondary | ICD-10-CM | POA: Diagnosis not present

## 2018-11-29 DIAGNOSIS — O2392 Unspecified genitourinary tract infection in pregnancy, second trimester: Secondary | ICD-10-CM | POA: Diagnosis not present

## 2018-11-29 DIAGNOSIS — G5603 Carpal tunnel syndrome, bilateral upper limbs: Secondary | ICD-10-CM | POA: Diagnosis not present

## 2018-11-29 DIAGNOSIS — O09522 Supervision of elderly multigravida, second trimester: Secondary | ICD-10-CM

## 2018-11-29 DIAGNOSIS — O09529 Supervision of elderly multigravida, unspecified trimester: Secondary | ICD-10-CM | POA: Diagnosis not present

## 2018-11-29 DIAGNOSIS — O99210 Obesity complicating pregnancy, unspecified trimester: Secondary | ICD-10-CM

## 2018-11-29 DIAGNOSIS — Z362 Encounter for other antenatal screening follow-up: Secondary | ICD-10-CM

## 2018-11-29 DIAGNOSIS — O99112 Other diseases of the blood and blood-forming organs and certain disorders involving the immune mechanism complicating pregnancy, second trimester: Secondary | ICD-10-CM | POA: Diagnosis not present

## 2018-11-29 DIAGNOSIS — O99212 Obesity complicating pregnancy, second trimester: Secondary | ICD-10-CM

## 2018-11-29 DIAGNOSIS — O0990 Supervision of high risk pregnancy, unspecified, unspecified trimester: Secondary | ICD-10-CM

## 2018-11-30 ENCOUNTER — Encounter: Payer: Self-pay | Admitting: Family Medicine

## 2018-11-30 DIAGNOSIS — Z148 Genetic carrier of other disease: Secondary | ICD-10-CM | POA: Insufficient documentation

## 2018-12-12 ENCOUNTER — Other Ambulatory Visit: Payer: Self-pay

## 2018-12-12 ENCOUNTER — Encounter: Payer: Self-pay | Admitting: Family Medicine

## 2018-12-12 ENCOUNTER — Ambulatory Visit (INDEPENDENT_AMBULATORY_CARE_PROVIDER_SITE_OTHER): Payer: Medicaid Other | Admitting: Family Medicine

## 2018-12-12 VITALS — BP 114/76 | HR 92 | Wt 209.0 lb

## 2018-12-12 DIAGNOSIS — O09522 Supervision of elderly multigravida, second trimester: Secondary | ICD-10-CM

## 2018-12-12 DIAGNOSIS — O09529 Supervision of elderly multigravida, unspecified trimester: Secondary | ICD-10-CM

## 2018-12-12 DIAGNOSIS — O0990 Supervision of high risk pregnancy, unspecified, unspecified trimester: Secondary | ICD-10-CM

## 2018-12-12 DIAGNOSIS — F1021 Alcohol dependence, in remission: Secondary | ICD-10-CM

## 2018-12-12 DIAGNOSIS — E669 Obesity, unspecified: Secondary | ICD-10-CM

## 2018-12-12 DIAGNOSIS — Z3A24 24 weeks gestation of pregnancy: Secondary | ICD-10-CM

## 2018-12-12 DIAGNOSIS — O99212 Obesity complicating pregnancy, second trimester: Secondary | ICD-10-CM

## 2018-12-12 NOTE — Progress Notes (Signed)
   PRENATAL VISIT NOTE  Subjective:  Kelsey Swanson is a 48 y.o. [phone] at [redacted]w[redacted]d being seen today for ongoing prenatal care.  She is currently monitored for the following issues for this high-risk pregnancy and has OBESITY, NOS; AMA (advanced maternal age) multigravida 75+; Fatty liver; Obesity (BMI 30.0-34.9); Acute alcoholic hepatitis; Thrombocytopenia ([place]); Acute alcoholic pancreatitis; Normocytic anemia; Alcohol dependence in early, early partial, sustained full, or sustained partial remission ([place]); Depression; Acute pancreatitis; AMA (advanced maternal age) multigravida 57+, unspecified trimester; Supervision of high risk pregnancy, antepartum; Slow transit constipation; Obesity in pregnancy with antepartum complication; Language barrier; Mass of breast, right; Carpal tunnel syndrome, bilateral; and 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  Patient reports feeling tired but otherwise no complaints. Many questions about previous labs and ultrasounds. Contractions: Not present. Vag. Bleeding: None.  Movement: Present. Denies leaking of fluid.   The following portions of the patient's history were reviewed and updated as appropriate: allergies, current medications, past family history, past medical history, past social history, past surgical history and problem list.   Objective:   Vitals:   12/12/18 0839  BP: 114/76  Pulse: 92  Weight: 209 lb (94.8 kg)    Fetal Status: Fetal Heart Rate (bpm): 142 Fundal Height: 25 cm Movement: Present     General:  Alert, oriented and cooperative. Patient is in no acute distress.  Skin: Skin is warm and dry. No rash noted.   Cardiovascular: Normal heart rate noted  Respiratory: Normal respiratory effort, no problems with respiration noted  Abdomen: Soft, gravid, appropriate for gestational age.  Pain/Pressure: Absent     Pelvic: Cervical exam deferred        Extremities: Normal range of motion.     Mental Status: Normal mood and  affect. Normal behavior. Normal judgment and thought content.   Assessment and Plan:  Pregnancy: [id number] at [redacted]w[redacted]d 1. Supervision of high risk pregnancy, antepartum RTC in 4 weeks Last [place] 10/7 for completion of anatomy. AFI WNL. Fetal anatomy complete and normal A1c 5.6 on 7/28. GTT and 28 week labs at next visit; consider CMP along with 28 week routine labs Wants BTL; sign paperwork closer to delivery (only right tube remains due to previous ectopic on left side)  2. AMA (advanced maternal age) multigravida 61+, unspecified trimester Serial growth exams beginning at 36 weeks; consider IOL at 39 weeks   3. Obesity (BMI 30.0-34.9) Discussed appropriate amount of extra calories each day and importance of staying active  4. Alcohol dependence in early, early partial, sustained full, or sustained partial remission ([place]) Recent liver enzymes and platelets are within normal limits No alcohol use reported since 1 month prior to the pregnancy  Preterm labor symptoms and general obstetric precautions including but not limited to vaginal bleeding, contractions, leaking of fluid and fetal movement were reviewed in detail with the patient. Please refer to After Visit Summary for other counseling recommendations.   Return in about 4 weeks (around 01/09/2019) for [facility]; in person.  Future Appointments  Date Time Provider [place]  01/09/2019  1:00 PM [place], Vickii Chafe, MD [place] None  01/11/2019  2:15 PM [place] NURSE [place] MFC-US  01/11/2019  2:15 PM [facility]-MFC [place] 4 WH-MFCUS MFC-US    Chauncey Mann, MD

## 2018-12-12 NOTE — Progress Notes (Signed)
ROB   CC: pt wants a different prenatal vitamin sent to her pharmacy.

## 2019-01-09 ENCOUNTER — Ambulatory Visit (INDEPENDENT_AMBULATORY_CARE_PROVIDER_SITE_OTHER): Payer: Medicaid Other | Admitting: Obstetrics and Gynecology

## 2019-01-09 ENCOUNTER — Encounter: Payer: Self-pay | Admitting: Obstetrics and Gynecology

## 2019-01-09 ENCOUNTER — Other Ambulatory Visit: Payer: Self-pay

## 2019-01-09 VITALS — BP 101/65 | HR 98 | Wt 214.0 lb

## 2019-01-09 DIAGNOSIS — Z789 Other specified health status: Secondary | ICD-10-CM

## 2019-01-09 DIAGNOSIS — F1021 Alcohol dependence, in remission: Secondary | ICD-10-CM

## 2019-01-09 DIAGNOSIS — O09529 Supervision of elderly multigravida, unspecified trimester: Secondary | ICD-10-CM

## 2019-01-09 DIAGNOSIS — O09523 Supervision of elderly multigravida, third trimester: Secondary | ICD-10-CM

## 2019-01-09 DIAGNOSIS — O0990 Supervision of high risk pregnancy, unspecified, unspecified trimester: Secondary | ICD-10-CM

## 2019-01-09 DIAGNOSIS — Z3A28 28 weeks gestation of pregnancy: Secondary | ICD-10-CM

## 2019-01-09 DIAGNOSIS — Z148 Genetic carrier of other disease: Secondary | ICD-10-CM

## 2019-01-09 DIAGNOSIS — O99313 Alcohol use complicating pregnancy, third trimester: Secondary | ICD-10-CM | POA: Diagnosis not present

## 2019-01-09 DIAGNOSIS — Z23 Encounter for immunization: Secondary | ICD-10-CM | POA: Diagnosis not present

## 2019-01-09 DIAGNOSIS — O99213 Obesity complicating pregnancy, third trimester: Secondary | ICD-10-CM

## 2019-01-09 DIAGNOSIS — O0993 Supervision of high risk pregnancy, unspecified, third trimester: Secondary | ICD-10-CM

## 2019-01-09 DIAGNOSIS — O99210 Obesity complicating pregnancy, unspecified trimester: Secondary | ICD-10-CM

## 2019-01-09 NOTE — Addendum Note (Signed)
Addended by: Delrae Alfred on: 01/09/2019 01:29 PM   Modules accepted: Orders

## 2019-01-09 NOTE — Progress Notes (Signed)
   PRENATAL VISIT NOTE  Subjective:  Kelsey Swanson is a 48 y.o. [phone] at [redacted]w[redacted]d being seen today for ongoing prenatal care.  She is currently monitored for the following issues for this high-risk pregnancy and has AMA (advanced maternal age) multigravida 65+; Fatty liver; Obesity (BMI 30.0-34.9); Acute alcoholic hepatitis; Acute alcoholic pancreatitis; Alcohol dependence in early, early partial, sustained full, or sustained partial remission ([place]); Depression; Acute pancreatitis; AMA (advanced maternal age) multigravida 53+, unspecified trimester; Supervision of high risk pregnancy, antepartum; Slow transit constipation; Obesity in pregnancy with antepartum complication; Language barrier; Mass of breast, right; Carpal tunnel syndrome, bilateral; and 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  Patient reports vaginal irritation.  Contractions: Not present. Vag. Bleeding: None.  Movement: Present. Denies leaking of fluid.   The following portions of the patient's history were reviewed and updated as appropriate: allergies, current medications, past family history, past medical history, past social history, past surgical history and problem list.   Objective:   Vitals:   01/09/19 1305  BP: 101/65  Pulse: 98  Weight: 214 lb (97.1 kg)    Fetal Status: Fetal Heart Rate (bpm): 150 Fundal Height: 29 cm Movement: Present     General:  Alert, oriented and cooperative. Patient is in no acute distress.  Skin: Skin is warm and dry. No rash noted.   Cardiovascular: Normal heart rate noted  Respiratory: Normal respiratory effort, no problems with respiration noted  Abdomen: Soft, gravid, appropriate for gestational age.  Pain/Pressure: Present     Pelvic: Cervical exam deferred        Extremities: Normal range of motion.  Edema: Trace  Mental Status: Normal mood and affect. Normal behavior. Normal judgment and thought content.   Assessment and Plan:  Pregnancy: [id number] at [redacted]w[redacted]d 1.  Supervision of high risk pregnancy, antepartum Patient is doing well with complaints of vaginal pruritis Patient to return before next appointment for third trimester labs BTL papers signed  2. 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 AMA (advanced maternal age) multigravida 26+, unspecified trimester Follow up growth ultrasound   4. Obesity in pregnancy with antepartum complication   5. Language barrier   6. Alcohol dependence in early, early partial, sustained full, or sustained partial remission ([place]) Stable. No alcohol intake since 1 month prior to pregnancy  Preterm labor symptoms and general obstetric precautions including but not limited to vaginal bleeding, contractions, leaking of fluid and fetal movement were reviewed in detail with the patient. Please refer to After Visit Summary for other counseling recommendations.   Return in about 2 years (around 01/08/2021) for Virtual, ROB, High risk.  Future Appointments  Date Time Provider [place]  01/11/2019  2:15 PM [place] MFC-US  01/11/2019  2:15 PM [place] 4 WH-MFCUS MFC-US    Mora Bellman, MD

## 2019-01-11 ENCOUNTER — Ambulatory Visit (HOSPITAL_COMMUNITY): Payer: Medicaid Other | Admitting: *Deleted

## 2019-01-11 ENCOUNTER — Encounter (HOSPITAL_COMMUNITY): Payer: Self-pay

## 2019-01-11 ENCOUNTER — Other Ambulatory Visit (HOSPITAL_COMMUNITY): Payer: Self-pay | Admitting: *Deleted

## 2019-01-11 ENCOUNTER — Other Ambulatory Visit: Payer: Self-pay

## 2019-01-11 ENCOUNTER — Ambulatory Visit (HOSPITAL_COMMUNITY)
Admission: RE | Admit: 2019-01-11 | Discharge: 2019-01-11 | Disposition: A | Discharge status: Home / Self Care | Payer: Medicaid Other | Source: Ambulatory Visit | Attending: Obstetrics and Gynecology | Admitting: Obstetrics and Gynecology

## 2019-01-11 DIAGNOSIS — G5603 Carpal tunnel syndrome, bilateral upper limbs: Secondary | ICD-10-CM | POA: Diagnosis not present

## 2019-01-11 DIAGNOSIS — O99213 Obesity complicating pregnancy, third trimester: Secondary | ICD-10-CM | POA: Diagnosis not present

## 2019-01-11 DIAGNOSIS — O0990 Supervision of high risk pregnancy, unspecified, unspecified trimester: Secondary | ICD-10-CM

## 2019-01-11 DIAGNOSIS — O99210 Obesity complicating pregnancy, unspecified trimester: Secondary | ICD-10-CM

## 2019-01-11 DIAGNOSIS — O2693 Pregnancy related conditions, unspecified, third trimester: Secondary | ICD-10-CM | POA: Diagnosis not present

## 2019-01-11 DIAGNOSIS — O09529 Supervision of elderly multigravida, unspecified trimester: Secondary | ICD-10-CM | POA: Insufficient documentation

## 2019-01-11 DIAGNOSIS — Z362 Encounter for other antenatal screening follow-up: Secondary | ICD-10-CM

## 2019-01-11 DIAGNOSIS — O09523 Supervision of elderly multigravida, third trimester: Secondary | ICD-10-CM

## 2019-01-11 DIAGNOSIS — Z3A28 28 weeks gestation of pregnancy: Secondary | ICD-10-CM | POA: Diagnosis not present

## 2019-01-12 ENCOUNTER — Other Ambulatory Visit: Payer: Medicaid Other

## 2019-01-12 DIAGNOSIS — O0990 Supervision of high risk pregnancy, unspecified, unspecified trimester: Secondary | ICD-10-CM | POA: Diagnosis not present

## 2019-01-13 LAB — CBC
Hematocrit: 36.3 % (ref 34.0–46.6)
Hemoglobin: 11.7 g/dL (ref 11.1–15.9)
MCH: 27.6 pg (ref 26.6–33.0)
MCHC: 32.2 g/dL (ref 31.5–35.7)
MCV: 86 fL (ref 79–97)
Platelets: 190 10*3/uL (ref 150–450)
RBC: 4.24 x10E6/uL (ref 3.77–5.28)
RDW: 13.3 % (ref 11.7–15.4)
WBC: 9.7 10*3/uL (ref 3.4–10.8)

## 2019-01-13 LAB — HIV ANTIBODY (ROUTINE TESTING W REFLEX): HIV Screen 4th Generation wRfx: NONREACTIVE

## 2019-01-13 LAB — GLUCOSE TOLERANCE, 2 HOURS W/ 1HR
Glucose, 1 hour: 174 mg/dL (ref 65–179)
Glucose, 2 hour: 138 mg/dL (ref 65–152)
Glucose, Fasting: 85 mg/dL (ref 65–91)

## 2019-01-13 LAB — RPR: RPR Ser Ql: NONREACTIVE

## 2019-01-23 ENCOUNTER — Ambulatory Visit (INDEPENDENT_AMBULATORY_CARE_PROVIDER_SITE_OTHER): Payer: Medicaid Other | Admitting: Obstetrics & Gynecology

## 2019-01-23 DIAGNOSIS — O0990 Supervision of high risk pregnancy, unspecified, unspecified trimester: Secondary | ICD-10-CM

## 2019-01-23 DIAGNOSIS — O09523 Supervision of elderly multigravida, third trimester: Secondary | ICD-10-CM

## 2019-01-23 DIAGNOSIS — O09529 Supervision of elderly multigravida, unspecified trimester: Secondary | ICD-10-CM

## 2019-01-23 DIAGNOSIS — Z3A30 30 weeks gestation of pregnancy: Secondary | ICD-10-CM

## 2019-01-23 DIAGNOSIS — O0993 Supervision of high risk pregnancy, unspecified, third trimester: Secondary | ICD-10-CM

## 2019-01-23 MED ORDER — TERCONAZOLE 0.4 % VA CREA: 1.0000 | TOPICAL_CREAM | Freq: Every day | VAGINAL | 0 refills | Status: DC

## 2019-01-23 NOTE — Progress Notes (Signed)
   TELEHEALTH VIRTUAL OBSTETRICS VISIT ENCOUNTER NOTE  I connected with Kelsey Swanson on 01/23/19 at  2:30 PM EST by telephone at home and verified that I am speaking with the correct person using two identifiers.   I discussed the limitations, risks, security and privacy concerns of performing an evaluation and management service by telephone and the availability of in person appointments. I also discussed with the patient that there may be a patient responsible charge related to this service. The patient expressed understanding and agreed to proceed.  Subjective:  Kelsey Swanson is a 48 y.o. [phone] at [redacted]w[redacted]d being followed for ongoing prenatal care.  She is currently monitored for the following issues for this high-risk pregnancy and has AMA (advanced maternal age) multigravida 56+; Fatty liver; Obesity (BMI 30.0-34.9); Acute alcoholic hepatitis; Acute alcoholic pancreatitis; Alcohol dependence in early, early partial, sustained full, or sustained partial remission ([place]); Depression; Acute pancreatitis; AMA (advanced maternal age) multigravida 41+, unspecified trimester; Supervision of high risk pregnancy, antepartum; Slow transit constipation; Obesity in pregnancy with antepartum complication; Language barrier; Mass of breast, right; Carpal tunnel syndrome, bilateral; and 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  Patient reports no complaints. Reports fetal movement. Denies any contractions, bleeding or leaking of fluid.   The following portions of the patient's history were reviewed and updated as appropriate: allergies, current medications, past family history, past medical history, past social history, past surgical history and problem list.   Objective:   General:  Alert, oriented and cooperative.   Mental Status: Normal mood and affect perceived. Normal judgment and thought content.  Rest of physical exam deferred due to type of encounter  Assessment and Plan:   Pregnancy: [id number] at [redacted]w[redacted]d There are no diagnoses linked to this encounter. Preterm labor symptoms and general obstetric precautions including but not limited to vaginal bleeding, contractions, leaking of fluid and fetal movement were reviewed in detail with the patient.  I discussed the assessment and treatment plan with the patient. The patient was provided an opportunity to ask questions and all were answered. The patient agreed with the plan and demonstrated an understanding of the instructions. The patient was advised to call back or seek an in-person office evaluation/go to [facility] at [facility] for any urgent or concerning symptoms. Please refer to After Visit Summary for other counseling recommendations.   I provided 10 minutes of non-face-to-face time during this encounter. [place] result reviewed, has f/u in 2 weeks Return in about 2 weeks (around 02/06/2019) for virtual.  Future Appointments  Date Time Provider [place]  02/09/2019  3:15 PM [place] MFC-US  02/09/2019  3:15 PM [place] 4 WH-MFCUS MFC-US  03/09/2019  3:15 PM [place] NURSE [place] MFC-US  03/09/2019  3:15 PM [place]    Emeterio Reeve, [place] for [place], [place]

## 2019-01-23 NOTE — Progress Notes (Signed)
Pt states she hit her stomach with car door and has bruising. Pt denies any cramping/bleeding.  Pt states she is still having vaginal itching, was to have tx last visit but did not get.   Pt is unable to check BP today.

## 2019-01-23 NOTE — Patient Instructions (Signed)

## 2019-02-06 ENCOUNTER — Encounter: Payer: Medicaid Other | Admitting: Obstetrics & Gynecology

## 2019-02-09 ENCOUNTER — Ambulatory Visit (HOSPITAL_COMMUNITY)
Admission: RE | Admit: 2019-02-09 | Discharge: 2019-02-09 | Disposition: A | Discharge status: Home / Self Care | Payer: Medicaid Other | Source: Ambulatory Visit | Attending: Obstetrics and Gynecology | Admitting: Obstetrics and Gynecology

## 2019-02-09 ENCOUNTER — Encounter (HOSPITAL_COMMUNITY): Payer: Self-pay

## 2019-02-09 ENCOUNTER — Ambulatory Visit (HOSPITAL_COMMUNITY): Payer: Medicaid Other | Admitting: *Deleted

## 2019-02-09 ENCOUNTER — Encounter: Payer: Medicaid Other | Admitting: Medical

## 2019-02-09 ENCOUNTER — Other Ambulatory Visit (HOSPITAL_COMMUNITY): Payer: Self-pay | Admitting: *Deleted

## 2019-02-09 ENCOUNTER — Other Ambulatory Visit (HOSPITAL_COMMUNITY): Payer: Self-pay | Admitting: Obstetrics

## 2019-02-09 ENCOUNTER — Other Ambulatory Visit: Payer: Self-pay

## 2019-02-09 DIAGNOSIS — Z3A32 32 weeks gestation of pregnancy: Secondary | ICD-10-CM | POA: Diagnosis not present

## 2019-02-09 DIAGNOSIS — O0990 Supervision of high risk pregnancy, unspecified, unspecified trimester: Secondary | ICD-10-CM | POA: Diagnosis not present

## 2019-02-09 DIAGNOSIS — G5603 Carpal tunnel syndrome, bilateral upper limbs: Secondary | ICD-10-CM | POA: Insufficient documentation

## 2019-02-09 DIAGNOSIS — O09529 Supervision of elderly multigravida, unspecified trimester: Secondary | ICD-10-CM

## 2019-02-09 DIAGNOSIS — O99323 Drug use complicating pregnancy, third trimester: Secondary | ICD-10-CM

## 2019-02-09 DIAGNOSIS — O09893 Supervision of other high risk pregnancies, third trimester: Secondary | ICD-10-CM

## 2019-02-09 DIAGNOSIS — Z362 Encounter for other antenatal screening follow-up: Secondary | ICD-10-CM | POA: Diagnosis not present

## 2019-02-09 DIAGNOSIS — O99210 Obesity complicating pregnancy, unspecified trimester: Secondary | ICD-10-CM | POA: Insufficient documentation

## 2019-02-12 ENCOUNTER — Other Ambulatory Visit (HOSPITAL_COMMUNITY): Payer: Self-pay | Admitting: Obstetrics

## 2019-02-12 DIAGNOSIS — O09529 Supervision of elderly multigravida, unspecified trimester: Secondary | ICD-10-CM

## 2019-02-16 IMAGING — MR MR ABDOMEN WO/W CM MRCP
12 of 24 series · 16 of 48 positions shown · IV contrast (multihance)
Comparison: CT 06/15/2016.

CLINICAL DATA: Epigastric pain with nausea and intermittent
vomiting for 3 days. History of ectopic pregnancy and hepatic
steatosis. Acute pancreatitis.

EXAM:
MRI ABDOMEN WITHOUT AND WITH CONTRAST (INCLUDING MRCP)
TECHNIQUE: Multiplanar multisequence MR imaging of the abdomen was performed
both before and after the administration of intravenous contrast.
Heavily T2-weighted images of the biliary and pancreatic ducts were
obtained, and three-dimensional MRCP images were rendered by post
processing.
CONTRAST:  16mL MULTIHANCE GADOBENATE DIMEGLUMINE 529 MG/ML IV SOLN

[Series 5: cor ssfse nav · coronal · 6.0mm · 0.78mm/px · 1 of 35 slices shown]
[im 1/35]
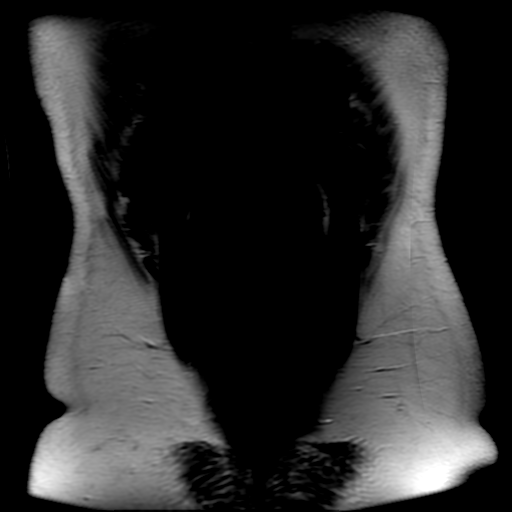

[Series 7: ax ssfse nav · axial · 6.0mm · 0.74mm/px · 1 of 45 slices shown]
[im 1/45]
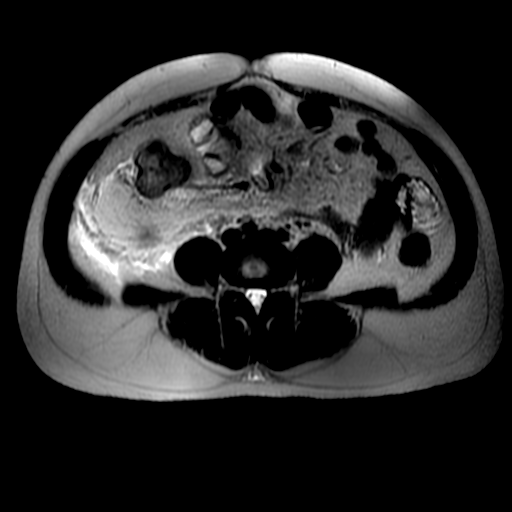

[Series 8: DWI b500 · axial · 8.0mm · 1.48mm/px · 1 of 54 slices shown]
[im 1/54]
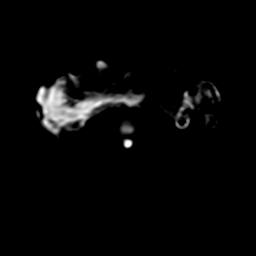

[Series 9: T2 fat-sat · axial · 6.0mm · 0.78mm/px · 1 of 45 slices shown]
[im 1/45]
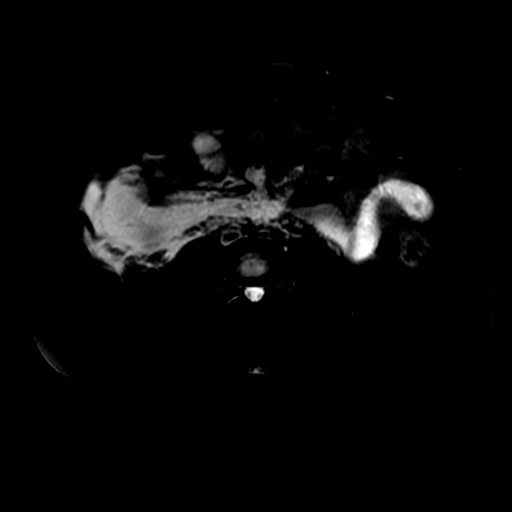

[Series 11: T1 dynamic · axial · 5.0mm · 0.82mm/px · z∈[+4,+272]mm · 2 of 108 slices shown (1 of 4)]
[im 1/108]
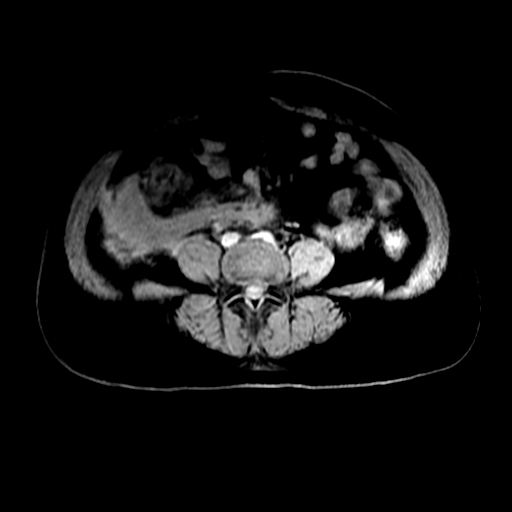
[im 108/108]
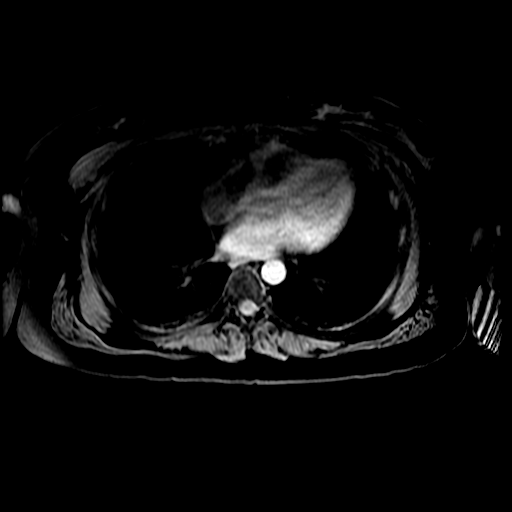

[Series 12: radial 2d thick · oblique · 40.0mm · 0.86mm/px · 1 of 6 slices shown]
[im 1/6]
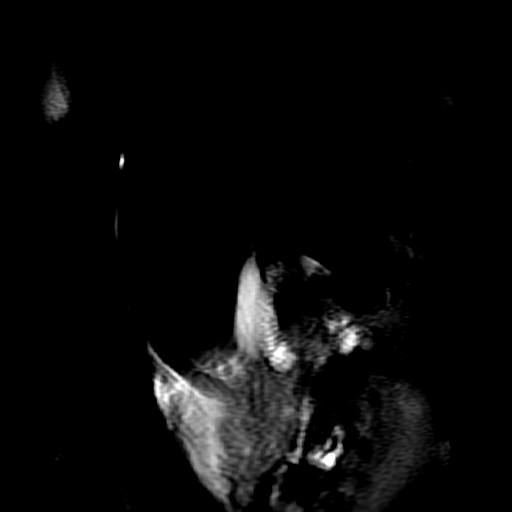

[Series 14: T1 dynamic · coronal · 3.4mm · 1.56mm/px · 2 of 124 slices shown (2 of 4)]
[im 1/124]
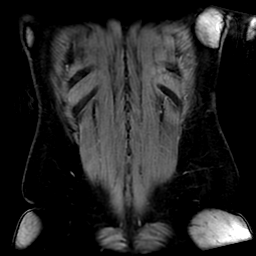
[im 124/124]
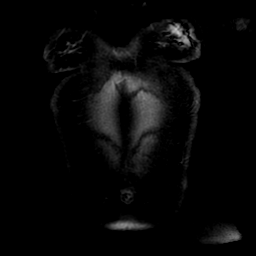

[Series 850: ADC · axial · 8.0mm · 1.48mm/px · 1 of 27 slices shown]
[im 1/27]
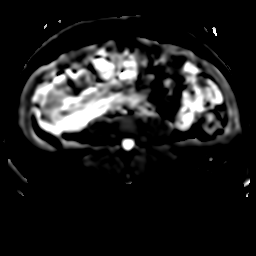

[Series 1050: processed images · axial · 2.0mm · 0.37mm/px · 1 of 13 slices shown (1 of 2)]
[im 1/13]
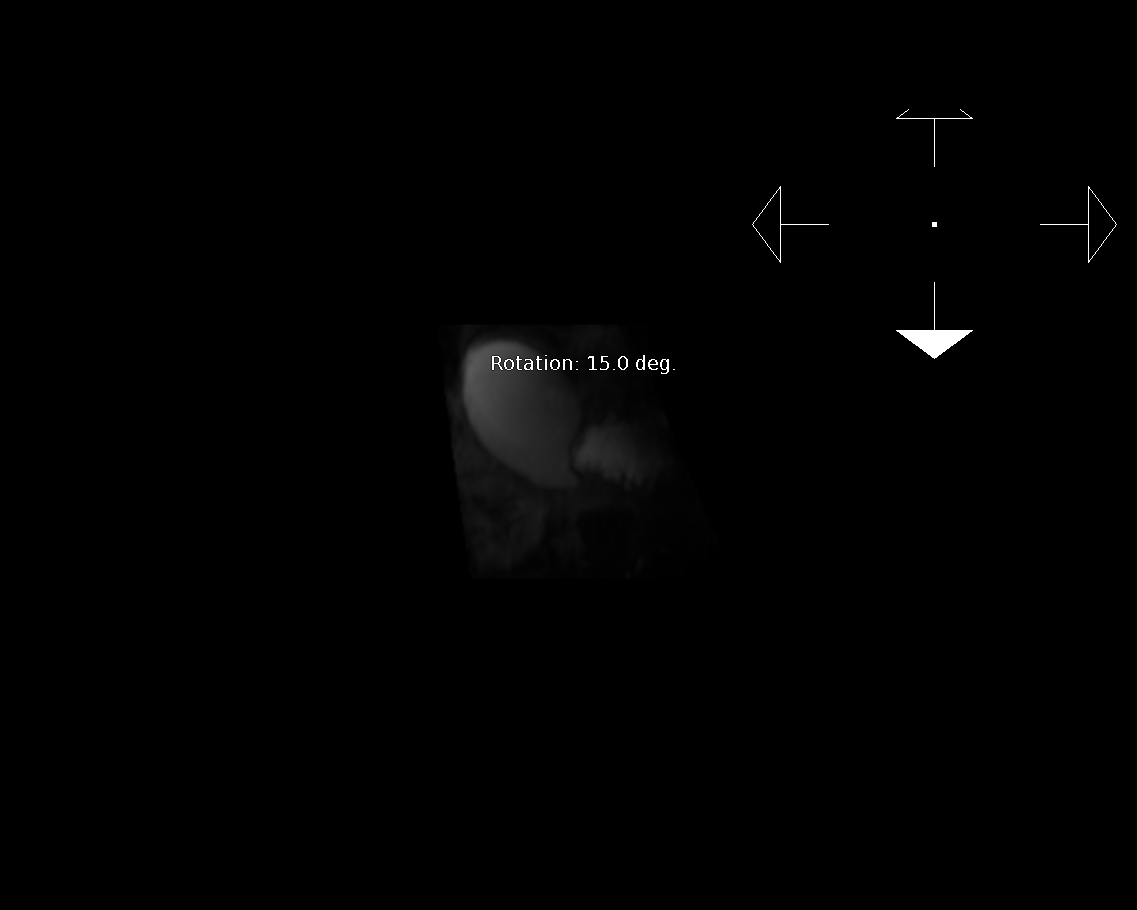

[Series 1051: processed images · sagittal · 2.0mm · 0.37mm/px · 1 of 12 slices shown (2 of 2)]
[im 1/12]
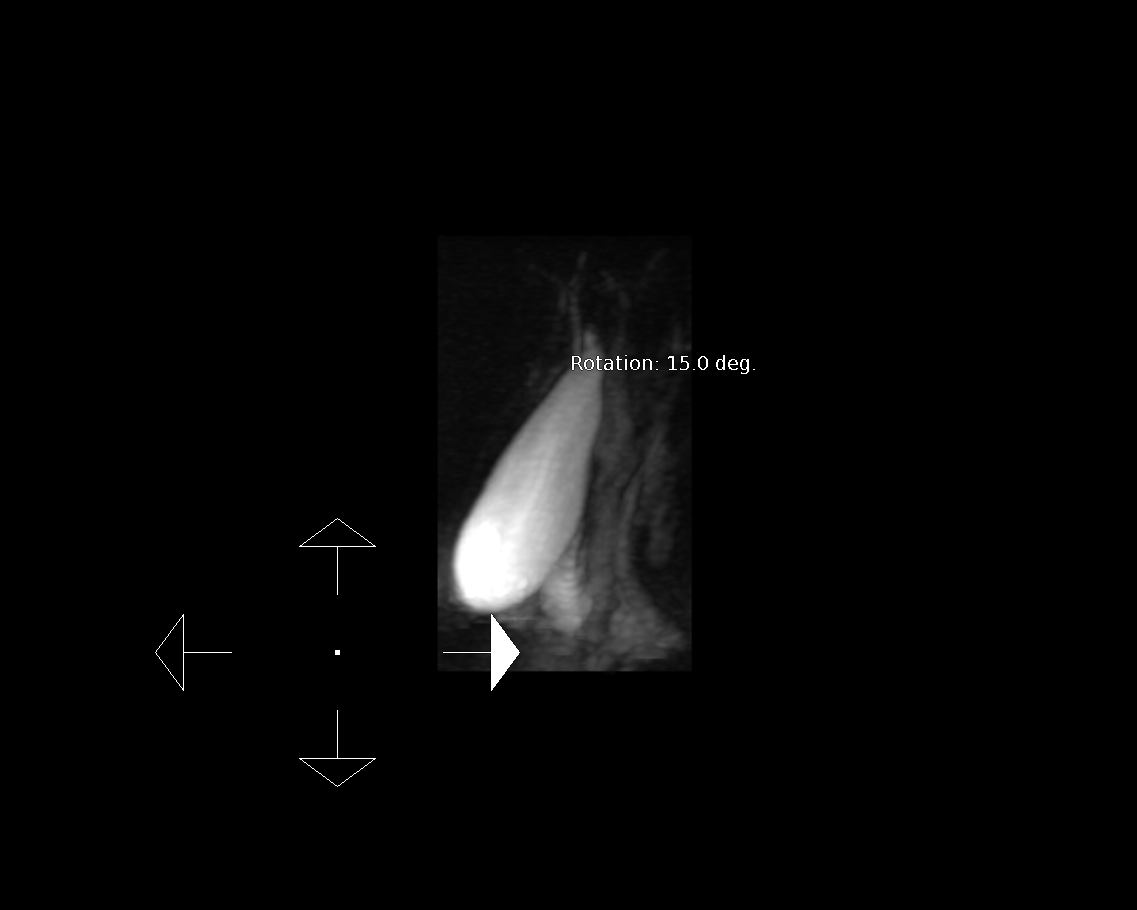

[Series 1100: T1 dynamic · axial · 5.0mm · 0.82mm/px · z∈[+4,+272]mm · 2 of 108 slices shown (3 of 4)]
[im 1/108]
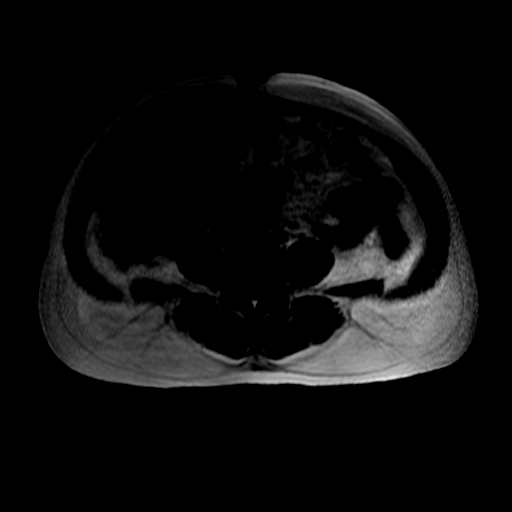
[im 108/108]
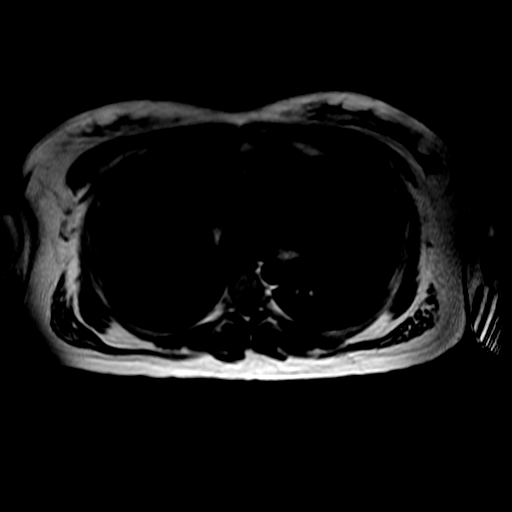

[Series 1101: T1 dynamic · axial · 5.0mm · 0.82mm/px · z∈[+4,+272]mm · 2 of 108 slices shown (4 of 4)]
[im 1/108]
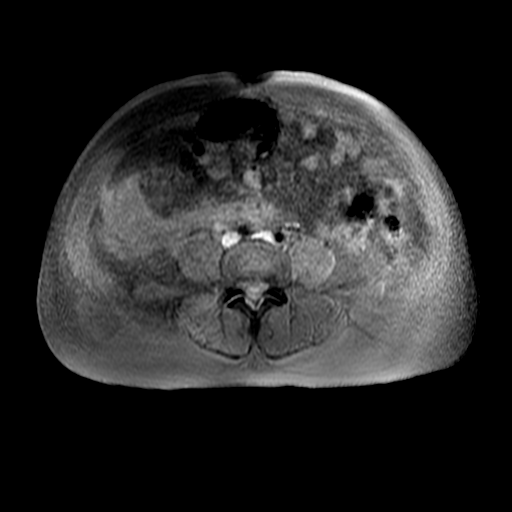
[im 108/108]
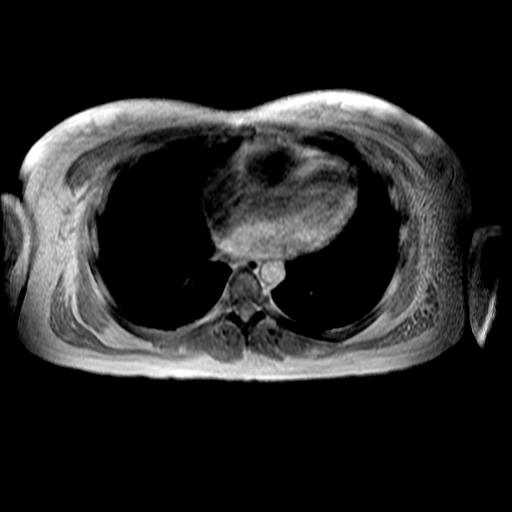

[16 of 48 positions shown; findings below may reference images not displayed]

FINDINGS: Lower chest: The visualized lower chest appears unremarkable. There
is no significant pleural effusion.

Hepatobiliary: The liver is enlarged with marked loss of signal on
gradient echo opposed phase images, consistent with severe
steatosis. No focal hepatic lesions are demonstrated. There is no
abnormal enhancement following contrast. The gallbladder is well
distended and appears normal. There is no evidence of cholelithiasis
or choledocholithiasis. There is no biliary dilatation.

Pancreas: Extensive peripancreatic inflammatory changes are again
demonstrated with fluid extension along the duodenum and into the
right pericolic gutter. The pancreas is homogeneous in signal and
demonstrates uniform enhancement following contrast. There is no
pancreatic ductal dilatation or focal fluid collection. The
pancreatic ductal anatomy is not well delineated. No definite
pancreas divisum.

Spleen: Normal in size without focal abnormality.

Adrenals/Urinary Tract: Both adrenal glands appear normal. Both
kidneys appear normal. There is no evidence of renal mass or
hydronephrosis. The bladder is not imaged.

Stomach/Bowel: No evidence of bowel wall thickening, distention or
surrounding inflammatory change.

Vascular/Lymphatic: There are no enlarged abdominal lymph nodes. No
significant vascular findings. The portal, superior mesenteric and
splenic veins are patent.

Other: As above, there is peripancreatic inflammation with fluid
extending into the right pericolic gutter.

Musculoskeletal: No acute or significant osseous findings.
IMPRESSION: 1. Acute pancreatitis with peripancreatic inflammation and fluid in
the right pericolic gutter. No evidence of pancreatic necrosis or
definite associated hemorrhage. Associated cause is not determined.
2. Hepatomegaly with severe steatosis.
3. No evidence of biliary dilatation or choledocholithiasis.

## 2019-02-23 NOTE — L&D Delivery Note (Signed)
OB/GYN Faculty Practice Delivery Note  Rylea Rinner is a 49 y.o. [phone] s/p SVD at [redacted]w[redacted]d. She was admitted for SOL.   ROM: 11h 50m with clear fluid GBS Status: --/Positive (01/14 0305), w/ PCN tx Maximum Maternal Temperature: 98.65F  Labor Progress: . Patient presented to L&D for SOL. Initial SVE: 4/70/-3. Labor course was complicated by GBS+ status but was given PCN for >4h prior to delivery. uncomplicated. She then progressed to complete.   Delivery Date/Time: 03/13/19 @ 1814 Delivery: Called to room and patient was complete and pushing. Head position was OA and delivered with ease over the perineum. Nuchal cord present x1. Shoulder and body delivered in usual fashion. Infant with spontaneous cry, placed on mother's abdomen, dried and stimulated. Cord clamped x 2 after 1-minute delay, and cut by FOB. Cord blood drawn. Placenta delivered spontaneously with gentle cord traction. Fundus firm with massage and pitocin started. Labia, perineum, vagina, and cervix inspected and significant for 1st degree perineal tear.  Baby Weight: pending  Cord: central insertion, 3 vessel Placenta: Sent to L&D Complications: None Lacerations: 1st degree perineal lac, and was repaired in the standard fashion EBL: 381cc Analgesia: Epidural   Infant: APGAR (1 MIN): 9 APGAR (5 MINS): [place], DO, PGY-1 OBGYN Faculty Teaching Service  03/13/2019, 7:52 PM

## 2019-03-08 ENCOUNTER — Other Ambulatory Visit: Payer: Self-pay

## 2019-03-08 ENCOUNTER — Ambulatory Visit (INDEPENDENT_AMBULATORY_CARE_PROVIDER_SITE_OTHER): Payer: Medicaid Other | Admitting: Family Medicine

## 2019-03-08 ENCOUNTER — Other Ambulatory Visit (HOSPITAL_COMMUNITY)
Admission: RE | Admit: 2019-03-08 | Discharge: 2019-03-08 | Disposition: A | Discharge status: Home / Self Care | Payer: Medicaid Other | Source: Ambulatory Visit | Attending: Obstetrics & Gynecology | Admitting: Obstetrics & Gynecology

## 2019-03-08 ENCOUNTER — Encounter: Payer: Self-pay | Admitting: Family Medicine

## 2019-03-08 VITALS — BP 112/72 | HR 89 | Wt 219.0 lb

## 2019-03-08 DIAGNOSIS — O0993 Supervision of high risk pregnancy, unspecified, third trimester: Secondary | ICD-10-CM

## 2019-03-08 DIAGNOSIS — O09523 Supervision of elderly multigravida, third trimester: Secondary | ICD-10-CM

## 2019-03-08 DIAGNOSIS — O0990 Supervision of high risk pregnancy, unspecified, unspecified trimester: Secondary | ICD-10-CM | POA: Diagnosis not present

## 2019-03-08 DIAGNOSIS — O09529 Supervision of elderly multigravida, unspecified trimester: Secondary | ICD-10-CM

## 2019-03-08 DIAGNOSIS — O99210 Obesity complicating pregnancy, unspecified trimester: Secondary | ICD-10-CM

## 2019-03-08 DIAGNOSIS — K5901 Slow transit constipation: Secondary | ICD-10-CM

## 2019-03-08 DIAGNOSIS — M549 Dorsalgia, unspecified: Secondary | ICD-10-CM

## 2019-03-08 DIAGNOSIS — O99213 Obesity complicating pregnancy, third trimester: Secondary | ICD-10-CM

## 2019-03-08 DIAGNOSIS — Z3A36 36 weeks gestation of pregnancy: Secondary | ICD-10-CM

## 2019-03-08 MED ORDER — COMFORT FIT MATERNITY SUPP SM MISC: 0 refills | Status: DC

## 2019-03-08 MED ORDER — POLYETHYLENE GLYCOL 3350 17 G PO PACK: 17.0000 g | PACK | Freq: Every day | ORAL | Status: DC

## 2019-03-08 NOTE — Progress Notes (Signed)
Subjective:  Kelsey Swanson is a 49 y.o. [phone] at [redacted]w[redacted]d being seen today for ongoing prenatal care.  She is currently monitored for the following issues for this high-risk pregnancy and has AMA (advanced maternal age) multigravida 20+; Fatty liver; Obesity (BMI 30.0-34.9); Acute alcoholic hepatitis; Acute alcoholic pancreatitis; Alcohol dependence in early, early partial, sustained full, or sustained partial remission ([place]); Depression; Acute pancreatitis; AMA (advanced maternal age) multigravida 77+, unspecified trimester; Supervision of high risk pregnancy, antepartum; Slow transit constipation; Obesity in pregnancy with antepartum complication; Language barrier; Mass of breast, right; Carpal tunnel syndrome, bilateral; and 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  Patient reports vaginal pressure, constipation.  Contractions: Not present. Vag. Bleeding: None.  Movement: Present. Denies leaking of fluid.   The following portions of the patient's history were reviewed and updated as appropriate: allergies, current medications, past family history, past medical history, past social history, past surgical history and problem list. Problem list updated.  Objective:   Vitals:   03/08/19 1424  BP: 112/72  Pulse: 89  Weight: 219 lb (99.3 kg)    Fetal Status: Fetal Heart Rate (bpm): 155 Fundal Height: 37 cm Movement: Present     General:  Alert, oriented and cooperative. Patient is in no acute distress.  Skin: Skin is warm and dry. No rash noted.   Cardiovascular: Normal heart rate noted  Respiratory: Normal respiratory effort, no problems with respiration noted  Abdomen: Soft, gravid, appropriate for gestational age. Pain/Pressure: Present     Pelvic: Vag. Bleeding: None     Cervical exam performed      Large amounts of stool palpated in rectal vault, unable to reach cervix due to stool  Extremities: Normal range of motion.  Edema: Trace  Mental Status: Normal mood and  affect. Normal behavior. Normal judgment and thought content.   Urinalysis:       Vaginal exam chaperoned by Elyn Peers, CMA  Assessment and Plan:  Pregnancy: [id number] at [redacted]w[redacted]d  1. Slow transit constipation - add Miralax daily  2. Supervision of high risk pregnancy, antepartum - Continue routine prenatal care - GBS and GC/CT swabs collected today  3. Obesity in pregnancy with antepartum complication - BPP tomorrow at MFM with follow up sono  4. AMA (advanced maternal age) multigravida 32+, unspecified trimester  Preterm labor symptoms and general obstetric precautions including but not limited to vaginal bleeding, contractions, leaking of fluid and fetal movement were reviewed in detail with the patient. Please refer to After Visit Summary for other counseling recommendations.  Return in about 1 week (around 03/15/2019) for [facility].   Kelsey Swanson L, DO

## 2019-03-08 NOTE — Patient Instructions (Signed)

## 2019-03-08 NOTE — Progress Notes (Signed)
Pt presents for ROB. Pt states that she is having a lot of pain in her lower abdomen.

## 2019-03-09 ENCOUNTER — Ambulatory Visit (HOSPITAL_COMMUNITY)
Admission: RE | Admit: 2019-03-09 | Discharge: 2019-03-09 | Disposition: A | Discharge status: Home / Self Care | Payer: Medicaid Other | Source: Ambulatory Visit | Attending: Obstetrics and Gynecology | Admitting: Obstetrics and Gynecology

## 2019-03-09 ENCOUNTER — Encounter (HOSPITAL_COMMUNITY): Payer: Self-pay

## 2019-03-09 ENCOUNTER — Ambulatory Visit (HOSPITAL_COMMUNITY): Payer: Medicaid Other | Admitting: *Deleted

## 2019-03-09 DIAGNOSIS — D696 Thrombocytopenia, unspecified: Secondary | ICD-10-CM | POA: Diagnosis not present

## 2019-03-09 DIAGNOSIS — O99113 Other diseases of the blood and blood-forming organs and certain disorders involving the immune mechanism complicating pregnancy, third trimester: Secondary | ICD-10-CM | POA: Diagnosis not present

## 2019-03-09 DIAGNOSIS — O09523 Supervision of elderly multigravida, third trimester: Secondary | ICD-10-CM | POA: Insufficient documentation

## 2019-03-09 DIAGNOSIS — O99210 Obesity complicating pregnancy, unspecified trimester: Secondary | ICD-10-CM

## 2019-03-09 DIAGNOSIS — Z3A36 36 weeks gestation of pregnancy: Secondary | ICD-10-CM | POA: Diagnosis not present

## 2019-03-09 DIAGNOSIS — O0990 Supervision of high risk pregnancy, unspecified, unspecified trimester: Secondary | ICD-10-CM

## 2019-03-09 DIAGNOSIS — O2693 Pregnancy related conditions, unspecified, third trimester: Secondary | ICD-10-CM

## 2019-03-09 DIAGNOSIS — O09529 Supervision of elderly multigravida, unspecified trimester: Secondary | ICD-10-CM

## 2019-03-09 DIAGNOSIS — E669 Obesity, unspecified: Secondary | ICD-10-CM | POA: Diagnosis not present

## 2019-03-09 DIAGNOSIS — Z362 Encounter for other antenatal screening follow-up: Secondary | ICD-10-CM

## 2019-03-09 DIAGNOSIS — O99213 Obesity complicating pregnancy, third trimester: Secondary | ICD-10-CM | POA: Insufficient documentation

## 2019-03-09 DIAGNOSIS — G5603 Carpal tunnel syndrome, bilateral upper limbs: Secondary | ICD-10-CM

## 2019-03-09 DIAGNOSIS — Z3A32 32 weeks gestation of pregnancy: Secondary | ICD-10-CM

## 2019-03-10 LAB — STREP GP B NAA: Strep Gp B NAA: POSITIVE — AB

## 2019-03-12 DIAGNOSIS — O339 Maternal care for disproportion, unspecified: Secondary | ICD-10-CM | POA: Diagnosis not present

## 2019-03-12 DIAGNOSIS — G5601 Carpal tunnel syndrome, right upper limb: Secondary | ICD-10-CM | POA: Diagnosis not present

## 2019-03-12 DIAGNOSIS — G5602 Carpal tunnel syndrome, left upper limb: Secondary | ICD-10-CM | POA: Diagnosis not present

## 2019-03-12 LAB — CERVICOVAGINAL ANCILLARY ONLY
Bacterial Vaginitis (gardnerella): NEGATIVE
Candida Glabrata: NEGATIVE
Candida Vaginitis: NEGATIVE
Chlamydia: NEGATIVE
Comment: NEGATIVE
Comment: NEGATIVE
Comment: NEGATIVE
Comment: NEGATIVE
Comment: NEGATIVE
Comment: NORMAL
Neisseria Gonorrhea: NEGATIVE
Trichomonas: NEGATIVE

## 2019-03-13 ENCOUNTER — Encounter (HOSPITAL_COMMUNITY): Payer: Self-pay | Admitting: Family Medicine

## 2019-03-13 ENCOUNTER — Other Ambulatory Visit: Payer: Self-pay

## 2019-03-13 ENCOUNTER — Inpatient Hospital Stay (HOSPITAL_COMMUNITY)
Admission: AD | Admit: 2019-03-13 | Discharge: 2019-03-15 | DRG: 797 | Disposition: A | Discharge status: Home / Self Care | Payer: Medicaid Other | Attending: Family Medicine | Admitting: Family Medicine

## 2019-03-13 DIAGNOSIS — D696 Thrombocytopenia, unspecified: Secondary | ICD-10-CM | POA: Diagnosis present

## 2019-03-13 DIAGNOSIS — O99314 Alcohol use complicating childbirth: Secondary | ICD-10-CM | POA: Diagnosis present

## 2019-03-13 DIAGNOSIS — O09529 Supervision of elderly multigravida, unspecified trimester: Secondary | ICD-10-CM

## 2019-03-13 DIAGNOSIS — N6310 Unspecified lump in the right breast, unspecified quadrant: Secondary | ICD-10-CM | POA: Diagnosis present

## 2019-03-13 DIAGNOSIS — E669 Obesity, unspecified: Secondary | ICD-10-CM | POA: Diagnosis present

## 2019-03-13 DIAGNOSIS — O4292 Full-term premature rupture of membranes, unspecified as to length of time between rupture and onset of labor: Secondary | ICD-10-CM | POA: Diagnosis present

## 2019-03-13 DIAGNOSIS — Z148 Genetic carrier of other disease: Secondary | ICD-10-CM

## 2019-03-13 DIAGNOSIS — O99824 Streptococcus B carrier state complicating childbirth: Secondary | ICD-10-CM | POA: Diagnosis present

## 2019-03-13 DIAGNOSIS — Z9079 Acquired absence of other genital organ(s): Secondary | ICD-10-CM

## 2019-03-13 DIAGNOSIS — O0990 Supervision of high risk pregnancy, unspecified, unspecified trimester: Secondary | ICD-10-CM

## 2019-03-13 DIAGNOSIS — O9912 Other diseases of the blood and blood-forming organs and certain disorders involving the immune mechanism complicating childbirth: Secondary | ICD-10-CM | POA: Diagnosis present

## 2019-03-13 DIAGNOSIS — Z302 Encounter for sterilization: Secondary | ICD-10-CM

## 2019-03-13 DIAGNOSIS — O99214 Obesity complicating childbirth: Secondary | ICD-10-CM | POA: Diagnosis present

## 2019-03-13 DIAGNOSIS — Z789 Other specified health status: Secondary | ICD-10-CM | POA: Diagnosis present

## 2019-03-13 DIAGNOSIS — O4290 Premature rupture of membranes, unspecified as to length of time between rupture and onset of labor, unspecified weeks of gestation: Secondary | ICD-10-CM | POA: Diagnosis present

## 2019-03-13 DIAGNOSIS — Z20822 Contact with and (suspected) exposure to covid-19: Secondary | ICD-10-CM | POA: Diagnosis present

## 2019-03-13 DIAGNOSIS — O99210 Obesity complicating pregnancy, unspecified trimester: Secondary | ICD-10-CM | POA: Diagnosis present

## 2019-03-13 DIAGNOSIS — O26893 Other specified pregnancy related conditions, third trimester: Secondary | ICD-10-CM | POA: Diagnosis present

## 2019-03-13 DIAGNOSIS — F1021 Alcohol dependence, in remission: Secondary | ICD-10-CM

## 2019-03-13 DIAGNOSIS — K5901 Slow transit constipation: Secondary | ICD-10-CM

## 2019-03-13 DIAGNOSIS — B951 Streptococcus, group B, as the cause of diseases classified elsewhere: Secondary | ICD-10-CM | POA: Diagnosis present

## 2019-03-13 DIAGNOSIS — Z3A37 37 weeks gestation of pregnancy: Secondary | ICD-10-CM

## 2019-03-13 DIAGNOSIS — Z603 Acculturation difficulty: Secondary | ICD-10-CM | POA: Diagnosis present

## 2019-03-13 DIAGNOSIS — G5603 Carpal tunnel syndrome, bilateral upper limbs: Secondary | ICD-10-CM

## 2019-03-13 DIAGNOSIS — Z87891 Personal history of nicotine dependence: Secondary | ICD-10-CM | POA: Diagnosis not present

## 2019-03-13 DIAGNOSIS — O98819 Other maternal infectious and parasitic diseases complicating pregnancy, unspecified trimester: Secondary | ICD-10-CM | POA: Diagnosis present

## 2019-03-13 HISTORY — DX: Acute pancreatitis without necrosis or infection, unspecified: K85.90

## 2019-03-13 LAB — CBC
HCT: 40.1 % (ref 36.0–46.0)
Hemoglobin: 12.8 g/dL (ref 12.0–15.0)
MCH: 27.4 pg (ref 26.0–34.0)
MCHC: 31.9 g/dL (ref 30.0–36.0)
MCV: 85.7 fL (ref 80.0–100.0)
Platelets: 190 10*3/uL (ref 150–400)
RBC: 4.68 MIL/uL (ref 3.87–5.11)
RDW: 14.1 % (ref 11.5–15.5)
WBC: 9.6 10*3/uL (ref 4.0–10.5)
nRBC: 0 % (ref 0.0–0.2)

## 2019-03-13 LAB — RESPIRATORY PANEL BY RT PCR (FLU A&B, COVID)
Influenza A by PCR: NEGATIVE
Influenza B by PCR: NEGATIVE
SARS Coronavirus 2 by RT PCR: NEGATIVE

## 2019-03-13 LAB — TYPE AND SCREEN
ABO/RH(D): O POS
Antibody Screen: NEGATIVE

## 2019-03-13 LAB — POCT FERN TEST: POCT Fern Test: POSITIVE

## 2019-03-13 MED ORDER — COCONUT OIL OIL: 1.0000 "application " | TOPICAL_OIL | Status: DC | PRN

## 2019-03-13 MED ORDER — TERBUTALINE SULFATE 1 MG/ML IJ SOLN: 0.2500 mg | Freq: Once | INTRAMUSCULAR | Status: DC | PRN

## 2019-03-13 MED ORDER — DOCUSATE SODIUM 100 MG PO CAPS
100.0000 mg | ORAL_CAPSULE | Freq: Two times a day (BID) | ORAL | Status: DC
  Administered 2019-03-14 – 2019-03-15 (×2): 100 mg via ORAL
  Filled 2019-03-13 (×2): qty 1

## 2019-03-13 MED ORDER — POLYETHYLENE GLYCOL 3350 17 G PO PACK
17.0000 g | PACK | Freq: Every day | ORAL | Status: DC
  Administered 2019-03-15: 17 g via ORAL
  Filled 2019-03-13: qty 1

## 2019-03-13 MED ORDER — TETANUS-DIPHTH-ACELL PERTUSSIS 5-2.5-18.5 LF-MCG/0.5 IM SUSP: 0.5000 mL | Freq: Once | INTRAMUSCULAR | Status: DC

## 2019-03-13 MED ORDER — OXYTOCIN 40 UNITS IN NORMAL SALINE INFUSION - SIMPLE MED
1.0000 m[IU]/min | INTRAVENOUS | Status: DC
  Administered 2019-03-13: 2 m[IU]/min via INTRAVENOUS
  Filled 2019-03-13: qty 1000

## 2019-03-13 MED ORDER — ZOLPIDEM TARTRATE 5 MG PO TABS: 5.0000 mg | ORAL_TABLET | Freq: Every evening | ORAL | Status: DC | PRN

## 2019-03-13 MED ORDER — LIDOCAINE HCL (PF) 1 % IJ SOLN
30.0000 mL | INTRAMUSCULAR | Status: AC | PRN
  Administered 2019-03-13: 30 mL via SUBCUTANEOUS
  Filled 2019-03-13: qty 30

## 2019-03-13 MED ORDER — LACTATED RINGERS IV SOLN: 500.0000 mL | INTRAVENOUS | Status: DC | PRN

## 2019-03-13 MED ORDER — SOD CITRATE-CITRIC ACID 500-334 MG/5ML PO SOLN: 30.0000 mL | ORAL | Status: DC | PRN

## 2019-03-13 MED ORDER — PRENATAL MULTIVITAMIN CH: 1.0000 | ORAL_TABLET | Freq: Every day | ORAL | Status: DC

## 2019-03-13 MED ORDER — OXYCODONE-ACETAMINOPHEN 5-325 MG PO TABS: 2.0000 | ORAL_TABLET | ORAL | Status: DC | PRN

## 2019-03-13 MED ORDER — FENTANYL CITRATE (PF) 100 MCG/2ML IJ SOLN
50.0000 ug | INTRAMUSCULAR | Status: DC | PRN
  Administered 2019-03-13: 100 ug via INTRAVENOUS
  Filled 2019-03-13: qty 2

## 2019-03-13 MED ORDER — OXYTOCIN BOLUS FROM INFUSION: 500.0000 mL | Freq: Once | INTRAVENOUS | Status: DC

## 2019-03-13 MED ORDER — OXYTOCIN 40 UNITS IN NORMAL SALINE INFUSION - SIMPLE MED: 2.5000 [IU]/h | INTRAVENOUS | Status: DC

## 2019-03-13 MED ORDER — OXYCODONE-ACETAMINOPHEN 5-325 MG PO TABS: 1.0000 | ORAL_TABLET | ORAL | Status: DC | PRN

## 2019-03-13 MED ORDER — SODIUM CHLORIDE 0.9 % IV SOLN
5.0000 10*6.[IU] | Freq: Once | INTRAVENOUS | Status: AC
  Administered 2019-03-13: 5 10*6.[IU] via INTRAVENOUS
  Filled 2019-03-13: qty 5

## 2019-03-13 MED ORDER — ONDANSETRON HCL 4 MG PO TABS: 4.0000 mg | ORAL_TABLET | ORAL | Status: DC | PRN

## 2019-03-13 MED ORDER — ONDANSETRON HCL 4 MG/2ML IJ SOLN: 4.0000 mg | INTRAMUSCULAR | Status: DC | PRN

## 2019-03-13 MED ORDER — PENICILLIN G POT IN DEXTROSE 60000 UNIT/ML IV SOLN
3.0000 10*6.[IU] | INTRAVENOUS | Status: DC
  Administered 2019-03-13 (×2): 3 10*6.[IU] via INTRAVENOUS
  Filled 2019-03-13 (×2): qty 50

## 2019-03-13 MED ORDER — SIMETHICONE 80 MG PO CHEW: 80.0000 mg | CHEWABLE_TABLET | ORAL | Status: DC | PRN

## 2019-03-13 MED ORDER — WITCH HAZEL-GLYCERIN EX PADS: 1.0000 "application " | MEDICATED_PAD | CUTANEOUS | Status: DC | PRN

## 2019-03-13 MED ORDER — ACETAMINOPHEN 325 MG PO TABS: 650.0000 mg | ORAL_TABLET | ORAL | Status: DC | PRN

## 2019-03-13 MED ORDER — BENZOCAINE-MENTHOL 20-0.5 % EX AERO: 1.0000 "application " | INHALATION_SPRAY | CUTANEOUS | Status: DC | PRN

## 2019-03-13 MED ORDER — LACTATED RINGERS IV SOLN: INTRAVENOUS | Status: DC

## 2019-03-13 MED ORDER — DIBUCAINE (PERIANAL) 1 % EX OINT: 1.0000 "application " | TOPICAL_OINTMENT | CUTANEOUS | Status: DC | PRN

## 2019-03-13 MED ORDER — IBUPROFEN 600 MG PO TABS
600.0000 mg | ORAL_TABLET | Freq: Four times a day (QID) | ORAL | Status: DC
  Administered 2019-03-13 – 2019-03-15 (×5): 600 mg via ORAL
  Filled 2019-03-13 (×5): qty 1

## 2019-03-13 MED ORDER — DIPHENHYDRAMINE HCL 25 MG PO CAPS: 25.0000 mg | ORAL_CAPSULE | Freq: Four times a day (QID) | ORAL | Status: DC | PRN

## 2019-03-13 MED ORDER — ONDANSETRON HCL 4 MG/2ML IJ SOLN: 4.0000 mg | Freq: Four times a day (QID) | INTRAMUSCULAR | Status: DC | PRN

## 2019-03-13 MED ORDER — SENNOSIDES-DOCUSATE SODIUM 8.6-50 MG PO TABS
2.0000 | ORAL_TABLET | ORAL | Status: DC
  Administered 2019-03-13 – 2019-03-14 (×2): 2 via ORAL
  Filled 2019-03-13 (×2): qty 2

## 2019-03-13 NOTE — Discharge Summary (Signed)
c   Postpartum Discharge Summary  Date of Service updated1/21/21     Patient Name: Kelsey Swanson DOB: December 27, 1970 MRN: [id number]  Date of admission: 03/13/2019 Delivering Provider: Serita Grammes D   Date of discharge: 03/15/2019  Admitting diagnosis: PROM (premature rupture of membranes) [O42.90] Intrauterine pregnancy: [redacted]w[redacted]d    Secondary diagnosis:  Principal Problem:   PROM (premature rupture of membranes) Active Problems:   Alcohol dependence in early, early partial, sustained full, or sustained partial remission ([place]   AMA (advanced maternal age) multigravida 35+, unspecified trimester   Supervision of high risk pregnancy, antepartum   Obesity in pregnancy with antepartum complication   Language barrier   Mass of breast, right   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 yo G220 [id number] [id number] admitted to the hospital 03/13/2019 for induction of labor.  Indication for induction: PROM.  Patient had an uncomplicated labor course, including Pitocin being given for induction after PCN dosing to adequately prophylax for GBS. Membrane Rupture Time/Date: 7:00 AM ,03/13/2019   Intrapartum Procedures: Episiotomy: None [1]                                         Lacerations:  1st degree [2]  Patient had delivery of a Viable infant.  Information for the patient's newborn:  [name], [name][[id number]]Delivery Method: Vaginal, Spontaneous(Filed from Delivery Summary)    03/13/2019  Details of  delivery can be found in separate delivery note.  Patient had a routine postpartum course. Patient is discharged home 03/15/19. Delivery time: 6:14 PM    Magnesium Sulfate received: No BMZ received: No Rhophylac:N/A MMR:N/A Transfusion:No  Physical exam  Vitals:   03/14/19 1445 03/14/19 1502 03/14/19 1605 03/14/19 2241  BP: 106/67 106/64 102/72 103/70  Pulse: 89 89 85 66  Resp: '16 18 17 16  '$ Temp:  98.8 F (37.1 C) 98.5 F (36.9 C) 98.1 F (36.7 C)  TempSrc:   Oral Oral  SpO2: 94% 96% 97% 99%  Weight:       General: alert, cooperative and no distress Lochia: appropriate Uterine Fundus: firm Incision: Healing well with no significant drainage DVT Evaluation: No evidence of DVT seen on physical exam. Labs: Lab Results  Component Value Date   WBC 9.6 03/13/2019   HGB 12.8 03/13/2019   HCT 40.1 03/13/2019   MCV 85.7 03/13/2019   PLT 190 03/13/2019   CMP Latest Ref Rng & Units 09/19/2018  Glucose 65 - 99 mg/dL 94  BUN 6 - 24 mg/dL 11  Creatinine 0.57 - 1.00 mg/dL 0.50(L)  Sodium 134 - 144 mmol/L 137  Potassium 3.5 - 5.2 mmol/L 4.1  Chloride 96 - 106 mmol/L 101  CO2 20 - 29 mmol/L 19(L)  Calcium 8.7 - 10.2 mg/dL 9.2  Total Protein 6.0 - 8.5 g/dL 6.7  Total Bilirubin 0.0 - 1.2 mg/dL <0.2  Alkaline Phos 39 - 117 IU/L 56  AST 0 - 40 IU/L 12  ALT 0 - 32 IU/L 11    Discharge instruction: per After Visit Summary and "Baby and Me Booklet".  After visit meds:  Allergies as of 03/15/2019   No Known Allergies     Medication List    STOP taking these medications   aspirin EC 81 MG tablet     TAKE these medications   Blood Pressure Kit Monitor BP at home regularly. large Cuff   DX:O09.90   Comfort Fit Maternity Supp Sm Misc Backache   docusate sodium 100 MG capsule Commonly known as: COLACE Take 1 capsule (100 mg total) by mouth 2 (two) times daily as needed.   ibuprofen 600 MG tablet Commonly known as: ADVIL Take 1 tablet (600 mg total) by mouth every 6  (six) hours.   oxyCODONE-acetaminophen 5-325 MG tablet Commonly known as: PERCOCET/ROXICET Take 1 tablet by mouth every 4 (four) hours as needed for moderate pain (pain scale 4-7).   prenatal multivitamin Tabs tablet Take 1 tablet by mouth daily at 12 noon.   Viactiv Calcium Plus D 650-12.5-40 MG-MCG-MCG Chew Generic drug: Calcium-Vitamin D-Vitamin K Chew 1 each by mouth daily.   vitamin C 1000 MG tablet Take 1,000 mg by mouth daily.       Diet: routine diet  Activity: Advance as tolerated. Pelvic rest for 6 weeks.   Outpatient follow up:4 weeks Follow up Appt: Future Appointments  Date Time Provider [place]  04/10/2019 10:00 AM Sloan Leiter, MD [place] None   Follow up Visit: [place]. Schedule an appointment as soon as possible for a visit in 4 week(s).   Specialty: Obstetrics and Gynecology Contact information: [address], [place] [phone]          Please schedule this patient for Postpartum visit in: 4 weeks with the following provider: Any provider Virtual For C/S patients schedule nurse incision check in weeks 2 weeks: no High risk pregnancy complicated by: AMA, thrombocytopenia Delivery mode:  SVD Anticipated Birth Control:  BTL done PP PP Procedures needed: none  Schedule Integrated BH visit: no    Newborn Data: Live born female  Birth Weight: 3314 gm (7lb 4.9oz)    APGAR: 9, 9  Newborn Delivery   Birth date/time: 03/13/2019 18:14:00 Delivery type:       Baby Feeding: Breast Disposition:home with mother   03/15/2019 Hansel Feinstein, CNM

## 2019-03-13 NOTE — MAU Note (Signed)
Presents with c/o LOF since 0730 this morning.  Denies ctxs.  Reports +FM.

## 2019-03-13 NOTE — H&P (Addendum)
LABOR AND DELIVERY ADMISSION HISTORY AND PHYSICAL NOTE  Kelsey Swanson is a 49 y.o. female [phone] with IUP at 68w1dby 1st trimester U/S presenting for SROM. Reports LOF since 0730 this AM. Reports positive fetal movement. She denies vaginal bleeding, or contractions.   She plans on bottle feeding. Her contraception plan is: BTL (RTL, LT was removed surgically 2/2 ectopic pregnancy in 2011).  [facility]Com[facility] , CWD, normal anatomy, cephalic presentation, Posterior placenta, 78%ile, [id number] [id number] Pregnancy complications:  - AMA - Obesity in pregnancy (BMI 37) - Thrombocytopenia in 1st trimester - History of alcohol use, quit prior to pregnancy  Past Medical History: Past Medical History:  Diagnosis Date  . Abnormal Pap smear 1996  . Fatty liver   . Headache   . Pancreatitis   . Pancreatitis, chronic ([place]   . Thrombocytopenia ([place]   . Vaginal Pap smear, abnormal     Past Surgical History: Past Surgical History:  Procedure Laterality Date  . BREAST EXCISIONAL BIOPSY Right   . BREAST SURGERY     right breast  . ECTOPIC PREGNANCY SURGERY  2012    Obstetrical History: OB History    Gravida  6   Para  3   Term  3   Preterm      AB  2   Living  3     SAB  1   TAB      Ectopic  1   Multiple  0   Live Births  3           Social History: Social History   Socioeconomic History  . Marital status: Married    Spouse name: Not on file  . Number of children: Not on file  . Years of education: Not on file  . Highest education level: Not on file  Occupational History  . Occupation: unemployed  Tobacco Use  . Smoking status: Former Smoker    Packs/day: 0.30    Years: 10.00    Pack years: 3.00    Types: Cigarettes    Quit date: 06/07/2015    Years since quitting: 3.7  . Smokeless tobacco: Never Used  Substance and Sexual Activity  . Alcohol use: Yes    Comment:  May 2020  . Drug use: No  . Sexual  activity: Not on file    Comment: early preg  Other Topics Concern  . Not on file  Social History Narrative  . Not on file   Social Determinants of Health   Financial Resource Strain:   . Difficulty of Paying Living Expenses: Not on file  Food Insecurity:   . Worried About RCharity fundraiserin the Last Year: Not on file  . Ran Out of Food in the Last Year: Not on file  Transportation Needs:   . Lack of Transportation (Medical): Not on file  . Lack of Transportation (Non-Medical): Not on file  Physical Activity:   . Days of Exercise per Week: Not on file  . Minutes of Exercise per Session: Not on file  Stress:   . Feeling of Stress : Not on file  Social Connections:   . Frequency of Communication with Friends and Family: Not on file  . Frequency of Social Gatherings with Friends and Family: Not on file  . Attends Religious Services: Not on file  . Active Member of Clubs or Organizations: Not on file  . Attends CArchivistMeetings: Not on file  .  Marital Status: Not on file    Family History: Family History  Problem Relation Age of Onset  . Diabetes Father   . Diabetes Mother   . Diabetes Sister   . Diabetes Maternal Aunt   . Ovarian cancer Maternal Aunt   . Diabetes Maternal Grandmother   . Diabetes Maternal Uncle   . Breast cancer Neg Hx     Allergies: No Known Allergies  Facility-Administered Medications Prior to Admission  Medication Dose Route Frequency Provider Last Rate Last Admin  . polyethylene glycol (MIRALAX / GLYCOLAX) packet 17 g  17 g Oral Daily Sparacino, Hailey L, DO       Medications Prior to Admission  Medication Sig Dispense Refill Last Dose  . Ascorbic Acid (VITAMIN C) 1000 MG tablet Take 1,000 mg by mouth daily.   03/12/2019 at 1200  . aspirin EC 81 MG tablet Take 1 tablet (81 mg total) by mouth daily. 90 tablet 3 03/12/2019 at 1800  . docusate sodium (COLACE) 100 MG capsule Take 1 capsule (100 mg total) by mouth 2 (two) times daily  as needed. 30 capsule 2 Past Week at Unknown time  . Prenatal Vit-Fe Fumarate-FA (MULTIVITAMIN-PRENATAL) 27-0.8 MG TABS tablet Take 1 tablet by mouth daily at 12 noon.   03/12/2019 at 1200  . Blood Pressure KIT Monitor BP at home regularly. large Cuff   DX:O09.90 1 kit 0   . Elastic Bandages & Supports (COMFORT FIT MATERNITY SUPP SM) MISC Backache 1 each 0   . Multiple Vitamins-Minerals (ALIVE ONCE DAILY WOMENS) TABS Take 1 tablet by mouth daily.     . naproxen (NAPROSYN) 500 MG tablet Take 1 tablet (500 mg total) by mouth 2 (two) times daily as needed. (Patient not taking: Reported on 03/08/2019) 40 tablet 1   . pantoprazole (PROTONIX) 20 MG tablet Take 1 tablet (20 mg total) by mouth daily. (Patient not taking: Reported on 03/09/2019) 30 tablet 1      Review of Systems  All systems reviewed and negative except as stated in HPI  Physical Exam Blood pressure 124/76, pulse 96, temperature 97.8 F (36.6 C), temperature source Axillary, resp. rate 20, weight 99.4 kg, last menstrual period 06/03/2018, SpO2 98 %. General appearance: alert, oriented, NAD Lungs: normal respiratory effort Heart: regular rate Abdomen: soft, non-tender; gravid Extremities: No calf swelling or tenderness Presentation: to be confirmed on first SVE  Fetal monitoringBaseline: 150 bpm, Variability: Good {> 6 bpm), Accelerations: Reactive and Decelerations: Absent Uterine activity Irregular, duration: 80-90s     Prenatal labs: ABO, Rh: O/Positive/-- (07/28 1409) Antibody: Negative (07/28 1409) Rubella: 15.70 (07/28 1409) RPR: Non Reactive (11/20 0813)  HBsAg: Negative (07/28 1409)  HIV: Non Reactive (11/20 0813)  GC/Chlamydia: neg/neg 03/08/2019 GBS: --Tessie Fass (01/14 0305)  2-hr GTT: WNL, early A1c 5.6% Genetic screening: Low risk panorama Anatomy US: WNL  Prenatal Transfer Tool  Maternal Diabetes: No Genetic Screening: Normal Maternal Ultrasounds/Referrals: Normal Fetal Ultrasounds or other Referrals:   None Maternal Substance Abuse:  Yes:  Type: Other: History of alcohol use. Significant Maternal Medications:  None Significant Maternal Lab Results: Group B Strep positive, Rh positive  Results for orders placed or performed during the hospital encounter of 03/13/19 (from the past 24 hour(s))  POCT fern test   Collection Time: 03/13/19  8:33 AM  Result Value Ref Range   POCT Fern Test Positive = ruptured amniotic membanes   CBC   Collection Time: 03/13/19  9:09 AM  Result Value Ref Range   WBC 9.6  4.0 - 10.5 K/uL   RBC 4.68 3.87 - 5.11 MIL/uL   Hemoglobin 12.8 12.0 - 15.0 g/dL   HCT 40.1 36.0 - 46.0 %   MCV 85.7 80.0 - 100.0 fL   MCH 27.4 26.0 - 34.0 pg   MCHC 31.9 30.0 - 36.0 g/dL   RDW 14.1 11.5 - 15.5 %   Platelets 190 150 - 400 K/uL   nRBC 0.0 0.0 - 0.2 %    Patient Active Problem List   Diagnosis Date Noted  . PROM (premature rupture of membranes) 03/13/2019  . 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 Carpal tunnel syndrome, bilateral 11/14/2018  . Supervision of high risk pregnancy, antepartum 09/19/2018  . Slow transit constipation 09/19/2018  . Obesity in pregnancy with antepartum complication 42/90/3795  . Language barrier 09/19/2018  . Mass of breast, right 09/19/2018  . AMA (advanced maternal age) multigravida 19+, unspecified trimester 09/18/2018  . Acute pancreatitis 07/02/2018  . Alcohol dependence in early, early partial, sustained full, or sustained partial remission ([place]) 07/01/2018  . Depression 07/01/2018  . Acute alcoholic hepatitis 58/31/6742  . Acute alcoholic pancreatitis 55/25/8948  . Fatty liver 07/23/2016  . Obesity (BMI 30.0-34.9) 07/23/2016  . AMA (advanced maternal age) multigravida 35+ 11/13/2015    Assessment: Emersyn Wyss is a 49 y.o. [id number] at 96w1dhere for SROM.   #Labor: Will expectantly manage for 4h until receives first dose of penicillin, then check cervix and determine management.  #Pain: IV pain meds PRN, declines  epidural #FWB: Cat I. Most recent [place]78%, 3228g, on leopolds today 3200g #GBS/ID: GBS+, PCN #COVID: negative #MOF: bottle #MOC: BTL, consent signed 01/09/2019  #Circ: Yes, outpatient   AMarylouise Stacks1/19/2021, 9:52 AM   OB FELLOW ATTESTATION  I have seen and examined this patient and edited the above documentation in the resident's note to reflect any changes or updates.  MAugustin Coupe MD/MP[facility] OB Fellow  03/13/2019, 12:23 PM

## 2019-03-14 ENCOUNTER — Inpatient Hospital Stay (HOSPITAL_COMMUNITY): Payer: Medicaid Other | Admitting: Anesthesiology

## 2019-03-14 ENCOUNTER — Encounter (HOSPITAL_COMMUNITY): Payer: Self-pay | Admitting: Family Medicine

## 2019-03-14 ENCOUNTER — Encounter: Payer: Medicaid Other | Admitting: Family Medicine

## 2019-03-14 ENCOUNTER — Encounter (HOSPITAL_COMMUNITY)
Admission: AD | Disposition: A | Discharge status: Home / Self Care | Payer: Self-pay | Source: Home / Self Care | Attending: Family Medicine

## 2019-03-14 DIAGNOSIS — Z302 Encounter for sterilization: Secondary | ICD-10-CM

## 2019-03-14 HISTORY — PX: TUBAL LIGATION: SHX77

## 2019-03-14 LAB — RPR: RPR Ser Ql: NONREACTIVE

## 2019-03-14 SURGERY — LIGATION, FALLOPIAN TUBE, POSTPARTUM
Anesthesia: Spinal | Laterality: Bilateral

## 2019-03-14 MED ORDER — FAMOTIDINE 20 MG PO TABS
40.0000 mg | ORAL_TABLET | Freq: Once | ORAL | Status: AC
  Administered 2019-03-14: 40 mg via ORAL
  Filled 2019-03-14: qty 2

## 2019-03-14 MED ORDER — METOCLOPRAMIDE HCL 10 MG PO TABS
10.0000 mg | ORAL_TABLET | Freq: Once | ORAL | Status: AC
  Administered 2019-03-14: 10 mg via ORAL
  Filled 2019-03-14: qty 1

## 2019-03-14 MED ORDER — LACTATED RINGERS IV SOLN: INTRAVENOUS | Status: DC

## 2019-03-14 MED ORDER — ONDANSETRON HCL 4 MG/2ML IJ SOLN: 4.0000 mg | Freq: Once | INTRAMUSCULAR | Status: DC | PRN

## 2019-03-14 MED ORDER — BUPIVACAINE HCL (PF) 0.25 % IJ SOLN
INTRAMUSCULAR | Status: AC
  Filled 2019-03-14: qty 20

## 2019-03-14 MED ORDER — BUPIVACAINE IN DEXTROSE 0.75-8.25 % IT SOLN
INTRATHECAL | Status: DC | PRN
  Administered 2019-03-14: 10.5 mg via INTRATHECAL

## 2019-03-14 MED ORDER — DEXAMETHASONE SODIUM PHOSPHATE 10 MG/ML IJ SOLN
INTRAMUSCULAR | Status: DC | PRN
  Administered 2019-03-14: 4 mg via INTRAVENOUS

## 2019-03-14 MED ORDER — HYDROMORPHONE HCL 1 MG/ML IJ SOLN
INTRAMUSCULAR | Status: AC
  Filled 2019-03-14: qty 0.5

## 2019-03-14 MED ORDER — ONDANSETRON HCL 4 MG/2ML IJ SOLN
INTRAMUSCULAR | Status: DC | PRN
  Administered 2019-03-14: 4 mg via INTRAVENOUS

## 2019-03-14 MED ORDER — DEXAMETHASONE SODIUM PHOSPHATE 4 MG/ML IJ SOLN
INTRAMUSCULAR | Status: AC
  Filled 2019-03-14: qty 1

## 2019-03-14 MED ORDER — BUPIVACAINE HCL (PF) 0.25 % IJ SOLN
INTRAMUSCULAR | Status: DC | PRN
  Administered 2019-03-14: 20 mL

## 2019-03-14 MED ORDER — KETOROLAC TROMETHAMINE 30 MG/ML IJ SOLN
30.0000 mg | Freq: Once | INTRAMUSCULAR | Status: AC | PRN
  Administered 2019-03-14: 30 mg via INTRAVENOUS

## 2019-03-14 MED ORDER — ONDANSETRON HCL 4 MG/2ML IJ SOLN
INTRAMUSCULAR | Status: AC
  Filled 2019-03-14: qty 2

## 2019-03-14 MED ORDER — KETOROLAC TROMETHAMINE 30 MG/ML IJ SOLN
INTRAMUSCULAR | Status: AC
  Filled 2019-03-14: qty 1

## 2019-03-14 MED ORDER — OXYCODONE HCL 5 MG/5ML PO SOLN: 5.0000 mg | Freq: Once | ORAL | Status: DC | PRN

## 2019-03-14 MED ORDER — HYDROMORPHONE HCL 1 MG/ML IJ SOLN
0.2500 mg | INTRAMUSCULAR | Status: DC | PRN
  Administered 2019-03-14 (×2): 0.25 mg via INTRAVENOUS

## 2019-03-14 MED ORDER — OXYCODONE HCL 5 MG PO TABS: 5.0000 mg | ORAL_TABLET | Freq: Once | ORAL | Status: DC | PRN

## 2019-03-14 SURGICAL SUPPLY — 21 items
BLADE SURG 11 STRL SS (BLADE) ×2 IMPLANT
CLIP FILSHIE TUBAL LIGA STRL (Clip) ×2 IMPLANT
CLOTH BEACON ORANGE TIMEOUT ST (SAFETY) ×2 IMPLANT
DRSG OPSITE POSTOP 3X4 (GAUZE/BANDAGES/DRESSINGS) ×2 IMPLANT
DRSG OPSITE POSTOP 4X14 (GAUZE/BANDAGES/DRESSINGS) ×1 IMPLANT
DURAPREP 26ML APPLICATOR (WOUND CARE) ×2 IMPLANT
GLOVE BIOGEL PI IND STRL 7.0 (GLOVE) ×3 IMPLANT
GLOVE BIOGEL PI INDICATOR 7.0 (GLOVE) ×3
GLOVE ECLIPSE 7.0 STRL STRAW (GLOVE) ×2 IMPLANT
GOWN STRL REUS W/TWL LRG LVL3 (GOWN DISPOSABLE) ×4 IMPLANT
NEEDLE HYPO 22GX1.5 SAFETY (NEEDLE) ×2 IMPLANT
NS IRRIG 1000ML POUR BTL (IV SOLUTION) ×2 IMPLANT
PACK ABDOMINAL MINOR (CUSTOM PROCEDURE TRAY) ×2 IMPLANT
PROTECTOR NERVE ULNAR (MISCELLANEOUS) ×2 IMPLANT
SPONGE LAP 4X18 RFD (DISPOSABLE) IMPLANT
SUT VICRYL 0 UR6 27IN ABS (SUTURE) ×2 IMPLANT
SUT VICRYL 4-0 PS2 18IN ABS (SUTURE) ×2 IMPLANT
SYR CONTROL 10ML LL (SYRINGE) ×2 IMPLANT
TOWEL OR 17X24 6PK STRL BLUE (TOWEL DISPOSABLE) ×4 IMPLANT
TRAY FOLEY CATH SILVER 14FR (SET/KITS/TRAYS/PACK) ×2 IMPLANT
WATER STERILE IRR 1000ML POUR (IV SOLUTION) ×2 IMPLANT

## 2019-03-14 NOTE — Anesthesia Preprocedure Evaluation (Signed)
Anesthesia Evaluation    Reviewed: Allergy & Precautions, Patient's Chart, lab work & pertinent test results  Airway Mallampati: II  TM Distance: >3 FB Neck ROM: Full    Dental no notable dental hx. (+) Teeth Intact, Dental Advisory Given   Pulmonary neg pulmonary ROS, former smoker,    Pulmonary exam normal breath sounds clear to auscultation       Cardiovascular negative cardio ROS Normal cardiovascular exam Rhythm:Regular Rate:Normal     Neuro/Psych  Headaches, negative psych ROS   GI/Hepatic negative GI ROS, Neg liver ROS,   Endo/Other  negative endocrine ROS  Renal/GU negative Renal ROS     Musculoskeletal negative musculoskeletal ROS (+)   Abdominal   Peds  Hematology Hgb 12.8 Plt 190   Anesthesia Other Findings   Reproductive/Obstetrics                             Anesthesia Physical Anesthesia Plan  ASA: III  Anesthesia Plan: Spinal   Post-op Pain Management:    Induction: Intravenous  PONV Risk Score and Plan:   Airway Management Planned: Nasal Cannula and Natural Airway  Additional Equipment:   Intra-op Plan:   Post-operative Plan:   Informed Consent:     Dental advisory given  Plan Discussed with:   Anesthesia Plan Comments: (Spinal for PPTL)        Anesthesia Quick Evaluation

## 2019-03-14 NOTE — Progress Notes (Signed)
Post Partum Day 1 Subjective: no complaints, up ad lib, voiding and tolerating PO  Objective: Blood pressure 104/72, pulse 71, temperature 98.2 F (36.8 C), temperature source Oral, resp. rate 17, weight 99.4 kg, last menstrual period 06/03/2018, SpO2 99 %, unknown if currently breastfeeding.  Physical Exam:  General: alert, cooperative and no distress Lochia: appropriate Uterine Fundus: firm Incision: n/a DVT Evaluation: No evidence of DVT seen on physical exam.  Recent Labs    03/13/19 0909  HGB 12.8  HCT 40.1    Assessment/Plan: Plan for discharge tomorrow and Breastfeeding Plan for BTL today   LOS: 1 day   Hansel Feinstein 03/14/2019, 7:52 AM

## 2019-03-14 NOTE — Anesthesia Procedure Notes (Signed)
Spinal  Patient location during procedure: OB Start time: 03/14/2019 12:27 PM End time: 03/14/2019 12:34 PM Staffing Performed: anesthesiologist  Anesthesiologist: Barnet Glasgow, MD Preanesthetic Checklist Completed: patient identified, IV checked, risks and benefits discussed, surgical consent, monitors and equipment checked, pre-op evaluation and timeout performed Spinal Block Patient position: sitting Prep: DuraPrep and site prepped and draped Patient monitoring: heart rate, cardiac monitor, continuous pulse ox and blood pressure Approach: midline Location: L3-4 Injection technique: single-shot Needle Needle type: Pencan  Needle gauge: 24 G Needle length: 10 cm Needle insertion depth: 7 cm Assessment Sensory level: T4 Additional Notes  1 Attempt (s). Pt tolerated procedure well.

## 2019-03-14 NOTE — Progress Notes (Signed)
CSW received consult for hx of PPD and Depression.  CSW met with MOB to offer support and complete assessment.    CSW used spanish speaking interpretor to speak with MOB as chart suggests that MOB speaks spanish. CSW introduced role and and advised MOB of why CSW had come to speak with her. MOB reports that after the birth of her daughter in  2017, is when she was diagnosed with PPD. MOB reports that she was never placed on any medications and expressed that it didn't last long for her. MOB expressed that she has since been feeling fine as she went to speak with someone and they told her " you don't need to be here". MOB informed CSW that she hasn't had any further signs of PPD or depression and feels fine. CSW inquired from MOB on her other mental health diagnosis and MOB reports that she doesn't have any other mental health diagnosis. CSW understanding and provided MOB further information.    CSW provided education regarding the baby blues period vs. perinatal mood disorders, discussed treatment and gave resources for mental health follow up if concerns arise.  CSW recommends self-evaluation during the postpartum time period using the New Mom Checklist from Postpartum Progress and encouraged MOB to contact a medical professional if symptoms are noted at any time.   CSW provided review of Sudden Infant Death Syndrome (SIDS) precautions.   CSW identifies no further need for intervention and no barriers to discharge at this time.    Virgie Dad Velmer Woelfel, MSW, LCSW [facility] [phone]

## 2019-03-14 NOTE — Progress Notes (Signed)
Post Partum Day 1 Subjective: Patient is NPO for BTL scheduled today at 1500. She is feeling well and has no complaints.  Reports some vaginal pain and bleeding, denies large amounts of blood loss, fatigue, dizziness.  Denies HA, CP, palpitations, blurry vision, SOB.   Objective: Blood pressure 104/72, pulse 71, temperature 98.2 F (36.8 C), temperature source Oral, resp. rate 17, weight 99.4 kg, last menstrual period 06/03/2018, SpO2 99 %, unknown if currently breastfeeding.   Physical Exam:  General: alert, cooperative and no distress Lochia: appropriate Uterine Fundus: firm Incision: n/a  DVT Evaluation: No evidence of DVT seen on physical exam. No cords or calf tenderness. No significant calf/ankle edema.  Recent Labs    03/13/19 0909  HGB 12.8  HCT 40.1    Assessment/Plan: 49 yo [id number] s/p SVD doing well.   Continue NPO diet.  Contraception: BTL scheduled for 1500.  Bottlefeeding.    LOS: 1 day   Ellin Goodie 03/14/2019, 7:38 AM

## 2019-03-14 NOTE — Op Note (Signed)
Kelsey Swanson 03/13/2019 - 03/14/2019  PREOPERATIVE DIAGNOSIS:  Undesired fertility, prior Left salpingoophorectomy  POSTOPERATIVE DIAGNOSIS:  Undesired fertility, prior Left salpingoophorectomy  PROCEDURE:  Right Tubal Sterilization using Filshie Clip  SURGEON:  Dr Lauretta Chester  ASSISTANT: Dr. Augustin Coupe  ANESTHESIA:  Epidural  COMPLICATIONS:  None immediate.  ESTIMATED BLOOD LOSS:  Less than 20cc.  FLUIDS: 2000 mL LR.  URINE OUTPUT:  25 mL of clear urine.  INDICATIONS: 49 y.o. yo [id number]  with undesired fertility,status post vaginal delivery, desires permanent sterilization. Risks and benefits of procedure discussed with patient including permanence of method, bleeding, infection, injury to surrounding organs and need for additional procedures. Risk failure of 0.5-1% with increased risk of ectopic gestation if pregnancy occurs was also discussed with patient.   FINDINGS:  Confirmed normal right ovary, right fallopian tube with many small cysts  TECHNIQUE:  The patient was taken to the operating room where her epidural anesthesia was dosed up to surgical level and found to be adequate.  She was then placed in the dorsal supine position and prepped and draped in sterile fashion.  After an adequate timeout was performed, attention was turned to the patient's abdomen where a small transverse skin incision was made under the umbilical fold. The incision was taken down to the layer of fascia using the scalpel, and fascia was incised, and extended bilaterally using Mayo scissors. The peritoneum was entered in a sharp fashion. Attention was then turned to the patient's uterus, and left fallopian tube was identified and followed out to the fimbriated end.    A Filshie clip was placed on the right fallopian tube about 2 cm from the cornual attachment, with care given to incorporate the underlying mesosalpinx.    Good hemostasis was noted overall.  Local analgesia was drizzled on both  operative sites.The instruments were then removed from the patient's abdomen and the fascial incision was repaired with 0 Vicryl, and the skin was closed with a 4-0 Vicryl subcuticular stitch. The patient tolerated the procedure well.  Sponge, lap, and needle counts were correct times two.  The patient was then taken to the recovery room awake, extubated and in stable condition.   Clarnce Flock, MD 03/14/2019 1:06 PM

## 2019-03-14 NOTE — Transfer of Care (Signed)
Immediate Anesthesia Transfer of Care Note  Patient: Kelsey Swanson  Procedure(s) Performed: POST PARTUM TUBAL LIGATION (Bilateral )  Patient Location: PACU  Anesthesia Type:Spinal  Level of Consciousness: awake, alert  and oriented  Airway & Oxygen Therapy: Patient Spontanous Breathing  Post-op Assessment: Report given to RN and Post -op Vital signs reviewed and stable  Post vital signs: Reviewed and stable  Last Vitals:  Vitals Value Taken Time  BP 91/57 03/14/19 1311  Temp    Pulse 78 03/14/19 1312  Resp 24 03/14/19 1312  SpO2 98 % 03/14/19 1312  Vitals shown include unvalidated device data.  Last Pain:  Vitals:   03/14/19 1220  TempSrc: Oral  PainSc:          Complications: No apparent anesthesia complications

## 2019-03-14 NOTE — Anesthesia Postprocedure Evaluation (Signed)
Anesthesia Post Note  Patient: Kelsey Swanson  Procedure(s) Performed: POST PARTUM TUBAL LIGATION (Bilateral )     Patient location during evaluation: Mother Baby Anesthesia Type: Spinal Level of consciousness: oriented and awake and alert Pain management: pain level controlled Vital Signs Assessment: post-procedure vital signs reviewed and stable Respiratory status: spontaneous breathing and respiratory function stable Cardiovascular status: blood pressure returned to baseline and stable Postop Assessment: no headache, no backache, no apparent nausea or vomiting and able to ambulate Anesthetic complications: no    Last Vitals:  Vitals:   03/14/19 1502 03/14/19 1605  BP: 106/64 102/72  Pulse: 89 85  Resp: 18 17  Temp: 37.1 C 36.9 C  SpO2: 96% 97%    Last Pain:  Vitals:   03/14/19 1924  TempSrc:   PainSc: [place]

## 2019-03-14 NOTE — Progress Notes (Signed)
Patient ID: Kelsey Swanson, female   DOB: 1970/09/26, 49 y.o.   MRN: [id number]   Patient desires permanent sterilization.  Other reversible forms of contraception were discussed with patient; she declines all other modalities. Risks of procedure discussed with patient including but not limited to: risk of regret, permanence of method, bleeding, infection, injury to surrounding organs and need for additional procedures.  Failure risk of 1-2 % with increased risk of ectopic gestation if pregnancy occurs was also discussed with patient.  Patient verbalized understanding of these risks and wants to proceed with sterilization.  Written informed consent obtained.  To OR when ready.  Caren Macadam, MD, MPH, ABFM, [facility] Attending Physician Center for [facility]

## 2019-03-14 NOTE — Progress Notes (Signed)
Instructed by Dr. Dione Plover to follow present routine orders, no changes to orders post-BTL.

## 2019-03-15 DIAGNOSIS — Z9079 Acquired absence of other genital organ(s): Secondary | ICD-10-CM

## 2019-03-15 MED ORDER — OXYCODONE-ACETAMINOPHEN 5-325 MG PO TABS: 1.0000 | ORAL_TABLET | ORAL | 0 refills | Status: DC | PRN

## 2019-03-15 MED ORDER — IBUPROFEN 600 MG PO TABS: 600.0000 mg | ORAL_TABLET | Freq: Four times a day (QID) | ORAL | 0 refills | Status: DC

## 2019-03-15 NOTE — Discharge Instructions (Signed)
Ligadura de trompas posparto, cuidados posteriores Postpartum Tubal Ligation, Care After Esta hoja le brinda informacin sobre cmo cuidarse despus del procedimiento. El mdico tambin podr darle instrucciones ms especficas. Comunquese con el mdico si tiene problemas o preguntas. Qu puedo esperar despus del procedimiento? Despus del procedimiento, puede tener lo siguiente:  Dolor de Investment banker, operational.  Moretones o Aeronautical engineer espalda.  Nuseas o vmitos.  Mareos.  Dolor o molestias abdominales leves, como clicos, dolor por gases o sensacin de hinchazn.  Dolor alrededor del rea de la incisin.  Cansancio.  Dolor en los hombros. Siga estas instrucciones en su casa: Medicamentos  Pregntele al mdico si el medicamento recetado: ? Hace que sea necesario que evite conducir o usar maquinaria pesada. ? Puede causarle estreimiento. Es posible que deba tomar medidas para prevenir o tratar el estreimiento, por ejemplo:  Beber suficiente lquido como para Theatre manager la orina de color amarillo plido.  Tomar medicamentos recetados o de [employer].  Consumir alimentos ricos en fibra, como frijoles, cereales integrales, y frutas y verduras frescas.  Limitar el consumo de alimentos ricos en grasa y azcares procesados, como los alimentos fritos o dulces.  No tome aspirina, ya que puede causar hemorragias. Actividad  Haga reposo por el resto del da.  Retome sus actividades normales segn lo indicado por el mdico. Pregntele al mdico qu actividades son seguras para usted.  No mantenga relaciones sexuales, no se haga duchas vaginales ni se coloque un tampn o cualquier otro objeto en la vagina durante 6semanas o el tiempo que le haya indicado el mdico.  No levante ningn objeto que sea ms pesado que su beb, o el lmite de peso que le hayan indicado, durante 2semanas o hasta que el mdico le diga que puede [place]. Cuidado de la incisin      Siga las instrucciones del  mdico acerca del cuidado de la incisin. Asegrese de hacer lo siguiente: ? Lvese las manos con agua y [place] antes y despus de cambiar la venda (vendaje). Use desinfectante para manos si no dispone de [place] y [place]. ? Cambie el vendaje como se lo haya indicado el mdico. ? No retire los puntos (suturas), la goma para cerrar la piel o las tiras [place]. Es posible que estos cierres cutneos deban quedar puestos en la piel durante 2semanas o ms tiempo. Si los bordes de las tiras adhesivas empiezan a despegarse y Therapist, sports, puede recortar los que estn sueltos. No retire las tiras [facility] por completo a menos que el mdico se lo indique.  [place] zona de la incisin para detectar signos de infeccin. Est atento a los siguientes signos: ? Enrojecimiento, hinchazn o dolor. ? Lquido o sangre. ? Calor. ? Pus o mal olor. Otras instrucciones  No tome baos de inmersin, no nade ni use el jacuzzi hasta que el mdico lo autorice. Pregntele al mdico si puede ducharse. Thurston Pounds solo le permitan darse baos de [place].  Concurra a todas las visitas de seguimiento como se lo haya indicado el mdico. Esto es importante. Comunquese con un mdico si:  Tiene enrojecimiento, hinchazn o dolor alrededor de la incisin.  Su incisin se siente caliente al tacto.  El dolor no mejora despus de 2a 3das.  Tiene una erupcin cutnea.  Tiene mareos o sensacin de desvanecimiento de forma repetida.  El medicamento no Production designer, theatre/television/film.  Tiene estreimiento. Solicite ayuda inmediatamente si:  Tiene fiebre.  Se desmaya.  Tiene dolor abdominal que empeora.  Presenta lquido o  sangre proveniente de la incisin.  Tiene pus o percibe que sale mal olor de su incisin.  Se le abren los bordes de la incisin despus de que le hayan sacado las suturas.  Le falta el aire o tiene dificultad para respirar.  Siente dolor en el pecho o en las piernas.  Tiene nuseas o diarrea  de forma continua. Resumen  Es comn sentir molestias abdominales leves despus de este procedimiento.  Comunquese con su mdico si presenta problemas o tiene alguna preocupacin.  No levante ningn objeto que sea ms pesado que su beb, o el lmite de peso que le hayan indicado, durante 2semanas o hasta que el mdico le diga que puede [place].  Concurra a todas las visitas de seguimiento como se lo haya indicado el mdico. Esto es importante. Esta informacin no tiene Marine scientist el consejo del mdico. Asegrese de hacerle al mdico cualquier pregunta que tenga. Document Revised: 02/27/2018 Document Reviewed: 02/27/2018 Elsevier Patient Education  [place] vaginal, cuidados de puerperio Postpartum Care After Vaginal Delivery Lea esta informacin sobre cmo cuidarse desde el momento en que nazca su beb y Waylan Boga 6 a 12 semanas despus del parto (perodo del posparto). El mdico tambin podr darle instrucciones ms especficas. Comunquese con su mdico si tiene problemas o preguntas. Siga estas indicaciones en su casa: Hemorragia vaginal  Es normal tener un poco de hemorragia vaginal (loquios) despus del parto. Use un apsito sanitario para el sangrado vaginal y secrecin. ? Durante la primera semana despus del parto, la cantidad y el aspecto de los loquios a menudo es similar a las del perodo menstrual. ? Durante las siguientes semanas disminuir gradualmente hasta convertirse en una secrecin seca amarronada o [place]. ? En la [employer], los loquios se detienen [employer] 4 a 6semanas despus del [place]. Los sangrados vaginales pueden variar de mujer a Art therapist.  Cambie los apsitos sanitarios con frecuencia. Observe si hay cambios en el flujo, como: ? Un aumento repentino en el volumen. ? Cambio en el color. ? Cogulos sanguneos grandes.  Si expulsa un cogulo de sangre por la vagina, gurdelo y llame al mdico para informrselo.  No deseche los cogulos de sangre por el inodoro antes de hablar con su mdico.  No use tampones ni se haga duchas vaginales hasta que el mdico la autorice.  Si no est amamantando, volver a tener su perodo entre 6 y 54 semanas despus del parto. Si solamente alimenta al beb con Steva Colder materna (lactancia materna exclusiva), podra no volver a tener su perodo hasta que deje de [place]. Cuidados perineales  Mantenga la zona entre la vagina y el ano (perineo) limpia y [place], [place] se lo haya indicado el mdico. Utilice apsitos o aerosoles analgsicos y Proofreader, como se lo hayan indicado.  Si le hicieron un corte en el perineo (episiotoma) o tuvo un desgarro en la vagina, controle la zona para detectar signos de infeccin hasta que sane. Est atenta a los siguientes signos: ? Aumento del enrojecimiento, la hinchazn o Conservation officer, historic buildings. ? Presenta lquido o sangre que supura del corte o Tree surgeon. ? Calor. ? Pus o mal olor.  Es posible que le den una botella rociadora para que use en lugar de limpiarse el rea con papel higinico despus de usar el bao. Cuando comience a Scientist, research (medical), podr usar la botella rociadora antes de secarse. Asegrese de secarse suavemente.  Para aliviar el dolor causado por una episiotoma, un desgarro en la vagina o venas hinchadas en  el ano (hemorroides), trate de tomar un bao de asiento tibio 2 o 3 veces por da. Un bao de asiento es un bao de agua tibia que se toma mientras se est sentado. El agua solo debe Systems analyst las caderas y cubrir las nalgas. [place] despus del parto, las mamas pueden sentirse pesadas, llenas e incmodas (congestin [place]). Tambin puede escaparse leche de sus senos. El mdico puede sugerirle mtodos para Gaffer. La congestin mamaria debera desaparecer al cabo de [employer].  Si est amamantando: ? Use un sostn que sujete y ajuste bien sus pechos. ? Mantenga los pezones secos y limpios.  Aplquese cremas y ungentos, como se lo haya indicado el mdico. ? Es posible que deba usar discos de algodn en el sostn para Tax adviser la [place] que se filtre de sus senos. ? Puede tener contracciones uterinas cada vez que amamante durante varias semanas despus del [place]. Las contracciones uterinas ayudan al tero a Stage manager a su tamao habitual. ? Si tiene algn problema con la lactancia materna, colabore con el mdico o un Lobbyist.  Si no est amamantando: ? Evite tocarse [employer]. Al hacerlo, podran producir ms leche. ? Use un sostn que le proporcione el ajuste correcto y compresas fras para reducir la hinchazn. ? No extraiga (saque) [employer]. Esto har que produzca ms [employer]. Intimidad y sexualidad  Pregntele al mdico cundo puede retomar la actividad sexual. Esto puede depender de lo siguiente: ? Su riesgo de sufrir infecciones. ? La rapidez con la que est sanando. ? Su comodidad y deseo de retomar la actividad sexual.  Despus del parto, puede quedar embarazada incluso si no ha tenido todava su perodo. Si lo desea, hable con el mdico acerca de los mtodos de control de la natalidad (mtodos anticonceptivos). Medicamentos  [employer] de venta libre y los recetados solamente como se lo haya indicado el mdico.  Si le recetaron un antibitico, tmelo como se lo haya indicado el mdico. No deje de tomar el antibitico aunque comience a sentirse mejor. Actividad  Retome sus actividades normales de a poco como se lo haya indicado el mdico. Pregntele al mdico qu actividades son seguras para usted.  Descanse todo lo que pueda. Trate de descansar o tomar una siesta mientras el beb duerme. Comida y bebida   Beba suficiente lquido como para Theatre manager la orina de color amarillo plido.  Coma alimentos ricos en [employer]. Estos pueden ayudarla a prevenir o Cytogeneticist. Los alimentos ricos en fibras incluyen, entre  otros: ? Panes y cereales integrales. ? Arroz integral. ? Optometrist. ? Lambert Mody y verduras frescas.  No intente perder de peso rpidamente reduciendo el consumo de caloras.  Tome sus vitaminas prenatales hasta la visita de seguimiento de posparto o hasta que su mdico le indique que puede dejar de tomarlas. Estilo de vida  No consuma ningn producto que contenga nicotina o tabaco, como cigarrillos y Psychologist, sport and exercise. Si necesita ayuda para dejar de fumar, consulte al mdico.  No beba alcohol, especialmente si est amamantando. Instrucciones generales  Concurra a todas las visitas de seguimiento para usted y el beb, como se lo haya indicado el mdico. La mayora de las mujeres visita al mdico para un seguimiento de posparto dentro de las primeras 3 a 6 semanas despus del parto. Comunquese con un mdico si:  Se siente incapaz de controlar los cambios que implica tener un hijo y esos  sentimientos no desaparecen.  Siente tristeza o preocupacin de forma [place] mamas se ponen rojas, le duelen o se endurecen.  Tiene fiebre.  Tiene dificultad para retener la [place] o para impedir que la orina se escape.  Tiene poco inters o falta de inters en actividades que solan gustarle.  No ha amamantado nada y no ha tenido un perodo menstrual durante 12 semanas despus del [place].  Dej de amamantar al beb y no ha tenido su perodo menstrual durante 12 semanas despus de dejar de Economist.  Tiene preguntas sobre su cuidado y el del beb.  Elimina un cogulo de sangre grande por la vagina. Solicite ayuda de inmediato si:  Electronics engineer.  Tiene dificultad para respirar.  Tiene un dolor repentino e intenso en la pierna.  Tiene dolor intenso o clicos en el la parte inferior del abdomen.  Tiene una hemorragia tan intensa de la vagina que empapa ms de un apsito en [employer]. El sangrado no debe ser ms abundante que el perodo ms intenso que haya  tenido.  Dolor de cabeza intenso.  Se desmaya.  Tiene visin borrosa o Geologist, engineering.  Tiene secrecin vaginal con mal olor.  Tiene pensamientos acerca de lastimarse a usted misma o a su beb. Si alguna vez siente que puede lastimarse a usted misma o a Producer, television/film/video, o tiene pensamientos de poner fin a su vida, busque ayuda de inmediato. Puede dirigirse al departamento de emergencias ms cercano o llamar a:  El servicio de emergencias de su localidad (911 en [place].UU.).  Una lnea de asistencia al suicida y Freight forwarder en crisis, como la [employer] de Prevencin del Suicidio (National Suicide Prevention Lifeline), al [phone])[phone]. Est disponible las 24 horas del da. Resumen  El perodo de tiempo justo despus el parto y Waylan Boga 15 a 12 semanas despus del parto se denomina perodo posparto.  Retome sus actividades normales de a Theme park manager se lo haya indicado el mdico.  Concurra a todas las visitas de seguimiento para usted y Photographer beb, como se lo haya indicado el mdico. Esta informacin no tiene Marine scientist el consejo del mdico. Asegrese de hacerle al mdico cualquier pregunta que tenga. Document Revised: 05/21/2017 Document Reviewed: 01/30/2017 Elsevier Patient Education  [place].

## 2019-03-16 ENCOUNTER — Ambulatory Visit (HOSPITAL_COMMUNITY): Payer: Medicaid Other

## 2019-03-20 ENCOUNTER — Other Ambulatory Visit: Payer: Self-pay

## 2019-03-20 ENCOUNTER — Ambulatory Visit (INDEPENDENT_AMBULATORY_CARE_PROVIDER_SITE_OTHER): Payer: Medicaid Other | Admitting: Obstetrics

## 2019-03-20 ENCOUNTER — Encounter: Payer: Self-pay | Admitting: Obstetrics

## 2019-03-20 VITALS — BP 119/78 | HR 82 | Wt 210.9 lb

## 2019-03-20 DIAGNOSIS — R21 Rash and other nonspecific skin eruption: Secondary | ICD-10-CM | POA: Diagnosis not present

## 2019-03-20 DIAGNOSIS — R2231 Localized swelling, mass and lump, right upper limb: Secondary | ICD-10-CM | POA: Diagnosis not present

## 2019-03-20 MED ORDER — HYDROCORTISONE 1 % EX LOTN: 1.0000 "application " | TOPICAL_LOTION | Freq: Two times a day (BID) | CUTANEOUS | 1 refills | Status: DC

## 2019-03-20 NOTE — Progress Notes (Signed)
S/p SVD & BTL 03/13/2019 c/o R axilla mass  Pt also c/o erythema on abdominal area.

## 2019-03-20 NOTE — Progress Notes (Signed)
Patient ID: Kelsey Swanson, female   DOB: Jul 22, 1970, 49 y.o.   MRN: [id number]  Chief Complaint  Patient presents with  . Mass    HPI Kelsey Swanson is a 49 y.o. female.  Complains of a lump under right arm that is getting larger.  The mass is non tender. HPI  Past Medical History:  Diagnosis Date  . Abnormal Pap smear 1996  . Fatty liver   . Headache   . Pancreatitis   . Pancreatitis, chronic ([place])   . Thrombocytopenia ([place])   . Vaginal Pap smear, abnormal     Past Surgical History:  Procedure Laterality Date  . BREAST EXCISIONAL BIOPSY Right   . BREAST SURGERY     right breast  . ECTOPIC PREGNANCY SURGERY  2012  . TUBAL LIGATION Bilateral 03/14/2019   Procedure: POST PARTUM TUBAL LIGATION;  Surgeon: Caren Macadam, MD;  Location: [facility] LD [facility]S;  Service: Gynecology;  Laterality: Bilateral;    Family History  Problem Relation Age of Onset  . Diabetes Father   . Diabetes Mother   . Diabetes Sister   . Diabetes Maternal Aunt   . Ovarian cancer Maternal Aunt   . Diabetes Maternal Grandmother   . Diabetes Maternal Uncle   . Breast cancer Neg Hx     Social History Social History   Tobacco Use  . Smoking status: Former Smoker    Packs/day: 0.30    Years: 10.00    Pack years: 3.00    Types: Cigarettes    Quit date: 06/07/2015    Years since quitting: 3.7  . Smokeless tobacco: Never Used  Substance Use Topics  . Alcohol use: Not Currently    Comment:  May 2020  . Drug use: No    No Known Allergies  Current Outpatient Medications  Medication Sig Dispense Refill  . oxyCODONE-acetaminophen (PERCOCET/ROXICET) 5-325 MG tablet Take 1 tablet by mouth every 4 (four) hours as needed for moderate pain (pain scale 4-7). 20 tablet 0  . Prenatal Vit-Fe Fumarate-FA (PRENATAL MULTIVITAMIN) TABS tablet Take 1 tablet by mouth daily at 12 noon.    . Ascorbic Acid (VITAMIN C) 1000 MG tablet Take 1,000 mg by mouth daily.    . Blood Pressure KIT Monitor BP at home  regularly. large Cuff   DX:O09.90 (Patient not taking: Reported on 03/20/2019) 1 kit 0  . Calcium-Vitamin D-Vitamin K (VIACTIV CALCIUM PLUS D) 650-12.5-40 MG-MCG-MCG CHEW Chew 1 each by mouth daily.    Marland Kitchen docusate sodium (COLACE) 100 MG capsule Take 1 capsule (100 mg total) by mouth 2 (two) times daily as needed. (Patient not taking: Reported on 03/20/2019) 30 capsule 2  . Elastic Bandages & Supports (COMFORT FIT MATERNITY SUPP SM) MISC Backache (Patient not taking: Reported on 03/20/2019) 1 each 0  . hydrocortisone 1 % lotion Apply 1 application topically 2 (two) times daily. 118 mL 1  . ibuprofen (ADVIL) 600 MG tablet Take 1 tablet (600 mg total) by mouth every 6 (six) hours. (Patient not taking: Reported on 03/20/2019) 30 tablet 0   Current Facility-Administered Medications  Medication Dose Route Frequency Provider Last Rate Last Admin  . polyethylene glycol (MIRALAX / GLYCOLAX) packet 17 g  17 g Oral Daily Sparacino, Hailey L, DO        Review of Systems Review of Systems Constitutional: negative for fatigue and weight loss Respiratory: negative for cough and wheezing Cardiovascular: negative for chest pain, fatigue and palpitations Gastrointestinal: negative for abdominal pain and change in  bowel habits Genitourinary:negative Integument/breast: positive soft, non tender mass in right axillary tail of breast Musculoskeletal:negative for myalgias Neurological: negative for gait problems and tremors Behavioral/Psych: negative for abusive relationship, depression Endocrine: negative for temperature intolerance      Blood pressure 119/78, pulse 82, weight 210 lb 14.4 oz (95.7 kg), unknown if currently breastfeeding.  Physical Exam Physical Exam General:   alert  Skin:   rash - bilateral sides of abdomen  Lungs:   clear to auscultation bilaterally  Heart:   regular rate and rhythm, S1, S2 normal, no murmur, click, rub or gallop  Breasts:   normal without suspicious masses, skin or nipple  changes or axillary nodes.  Soft, non tender mass in right axilla  Abdomen:  normal findings: no organomegaly, soft, non-tender and no hernia  50% of 25 min visit spent on counseling and coordination of care.   Data Reviewed Mammograms  Assessment     1. Axillary mass, right Rx: - US BREAST LTD UNI RIGHT INC AXILLA; Future  2. Rash of body Rx: - hydrocortisone 1 % lotion; Apply 1 application topically 2 (two) times daily.  Dispense: 118 mL; Refill: 1    Plan    Follow up in 2 weeks.  MyChart for results.  Orders Placed This Encounter  Procedures  . US BREAST LTD UNI RIGHT INC AXILLA    Standing Status:   Future    Standing Expiration Date:   05/17/2020    Order Specific Question:   Reason for Exam (SYMPTOM  OR DIAGNOSIS REQUIRED)    Answer:   Right axillary mass    Order Specific Question:   Preferred imaging location?    Answer:   [facility]   Meds ordered this encounter  Medications  . hydrocortisone 1 % lotion    Sig: Apply 1 application topically 2 (two) times daily.    Dispense:  118 mL    Refill:  1     [name], MD 03/20/2019 11:39 AM

## 2019-03-21 ENCOUNTER — Other Ambulatory Visit (HOSPITAL_COMMUNITY): Payer: Self-pay | Admitting: *Deleted

## 2019-03-21 DIAGNOSIS — R2231 Localized swelling, mass and lump, right upper limb: Secondary | ICD-10-CM

## 2019-03-29 ENCOUNTER — Other Ambulatory Visit: Payer: Self-pay

## 2019-03-29 ENCOUNTER — Encounter (HOSPITAL_COMMUNITY): Payer: Self-pay

## 2019-03-29 ENCOUNTER — Ambulatory Visit (HOSPITAL_COMMUNITY)
Admission: RE | Admit: 2019-03-29 | Discharge: 2019-03-29 | Disposition: A | Discharge status: Home / Self Care | Payer: Self-pay | Source: Ambulatory Visit | Attending: Obstetrics and Gynecology | Admitting: Obstetrics and Gynecology

## 2019-03-29 DIAGNOSIS — Z1239 Encounter for other screening for malignant neoplasm of breast: Secondary | ICD-10-CM | POA: Insufficient documentation

## 2019-03-29 DIAGNOSIS — N6311 Unspecified lump in the right breast, upper outer quadrant: Secondary | ICD-10-CM | POA: Insufficient documentation

## 2019-03-29 NOTE — Progress Notes (Signed)
Complaints of right axillary lump x one year.   Pap Smear: Pap smear not completed today. Last Pap smear was 05/01/2018 at the free cervical cancer screening at the [facility]. Patients previous Pap smear was 07/01/2015 and normal with negative HPV. Per patient has a history of two abnormal Pap smears 12/06/2011 that was LSIL and no follow-up completed and another in 1996 that a colposcopy was completed for follow-up.Patient has had at least three normal Pap smears since abnormal. Last three Pap smear results are in Epic.  Physical exam: Breasts Breasts symmetrical. No skin abnormalities bilateral breasts. Bilateral nipple inversion that per patient is normal for her. Patient is currently producing breast milk due to birth of baby 3 weeks ago. No lymphadenopathy.  No lumps palpated left breast. Palpated a pea sized lump within the right breast at 11 o'clock 13 cm from the nipple. No complaints of pain or tenderness on exam. Referred patient to the [facility] for a diagnostic mammogram. Appointment scheduled for Tuesday, April 03, 2019 at [place].        Pelvic/Bimanual No Pap smear completed today since last Pap smear was 05/01/2018. Pap smear not indicated per BCCCP guidelines.   Smoking History: Patient is a former smoker that quit 06/07/2015.  Patient Navigation: Patient education provided. Access to services provided for patient through [facility] program. Spanish interpreter provided.   Breast and Cervical Cancer Risk Assessment: Patient has no family history of breast cancer, known genetic mutations, or radiation treatment to the chest before age 3. Patient has a history of cervical dysplasia. Patient has no history of being immunocompromised or DES exposure in-utero.  Risk Assessment    Risk Scores      03/29/2019 10/20/2017   Last edited by: Demetrius Revel, LPN Rolena Infante H, LPN   5-year risk: 0.5 % 0.4 %   Lifetime risk: 5.3 % 5.5 %          Used Spanish interpreter Rudene Anda from [place].

## 2019-03-29 NOTE — Patient Instructions (Signed)
Explained breast self awareness with Kelsey Swanson. Patient did not need a Pap smear today due to last Pap smear was 05/01/2018. Let her know BCCCP will cover Pap smears every 3 years unless has a history of abnormal Pap smears. Referred patient to the [facility] for a diagnostic mammogram. Appointment scheduled for Tuesday, April 03, 2019 at [place]. Patient aware of appointment and will be there. [name] verbalized understanding.  Leonard Feigel, Arvil Chaco, RN 8:40 PM

## 2019-04-03 ENCOUNTER — Ambulatory Visit
Admission: RE | Admit: 2019-04-03 | Discharge: 2019-04-03 | Disposition: A | Discharge status: Home / Self Care | Payer: Medicaid Other | Source: Ambulatory Visit | Attending: Obstetrics and Gynecology | Admitting: Obstetrics and Gynecology

## 2019-04-03 ENCOUNTER — Other Ambulatory Visit (HOSPITAL_COMMUNITY): Payer: Self-pay | Admitting: Obstetrics and Gynecology

## 2019-04-03 ENCOUNTER — Other Ambulatory Visit: Payer: Self-pay

## 2019-04-03 DIAGNOSIS — R2231 Localized swelling, mass and lump, right upper limb: Secondary | ICD-10-CM

## 2019-04-10 ENCOUNTER — Telehealth (INDEPENDENT_AMBULATORY_CARE_PROVIDER_SITE_OTHER): Payer: Medicaid Other | Admitting: Obstetrics

## 2019-04-10 ENCOUNTER — Encounter: Payer: Self-pay | Admitting: Obstetrics

## 2019-04-10 DIAGNOSIS — Z9851 Tubal ligation status: Secondary | ICD-10-CM

## 2019-04-10 DIAGNOSIS — Z9079 Acquired absence of other genital organ(s): Secondary | ICD-10-CM

## 2019-04-10 DIAGNOSIS — Z90721 Acquired absence of ovaries, unilateral: Secondary | ICD-10-CM

## 2019-04-10 NOTE — Progress Notes (Signed)
[place] Partum Exam  Kelsey Swanson is a 49 y.o. [phone] female who presents for a postpartum visit. She is 4 weeks postpartum following a spontaneous vaginal delivery. I have fully reviewed the prenatal and intrapartum course. The delivery was at 9 gestational weeks.  Anesthesia: none. Postpartum course has been normal. Baby's course has been normal. Baby is feeding by bottle - GERBER. Bleeding staining only. Bowel function is normal. Bladder function is normal. Patient is not sexually active. Contraception method is tubal ligation. Postpartum depression screening:neg  The following portions of the patient's history were reviewed and updated as appropriate: allergies, current medications, past family history, past medical history, past social history, past surgical history and problem list. Last pap smear done 05-01-2018 and was Normal  Review of Systems A comprehensive review of systems was negative.    Objective:  not currently breastfeeding.   PE:  Deferred due to nature of visit being virtual           Assessment:    1. Postpartum care following vaginal delivery - doing well  2. Status post left tubal sterilization with Filshie Clip  3. History of left salpingo-oophorectomy   Plan:   1. Contraception: tubal ligation 2. Continue PNV's 3. Follow up in: 4 weeks or as needed.   [name], MD 04/10/2019 12:13 PM

## 2019-05-08 ENCOUNTER — Other Ambulatory Visit: Payer: Self-pay

## 2019-05-08 ENCOUNTER — Encounter: Payer: Self-pay | Admitting: Obstetrics

## 2019-05-08 ENCOUNTER — Ambulatory Visit (INDEPENDENT_AMBULATORY_CARE_PROVIDER_SITE_OTHER): Payer: Medicaid Other | Admitting: Obstetrics

## 2019-05-08 DIAGNOSIS — Z3689 Encounter for other specified antenatal screening: Secondary | ICD-10-CM | POA: Diagnosis not present

## 2019-05-08 DIAGNOSIS — M79604 Pain in right leg: Secondary | ICD-10-CM

## 2019-05-08 DIAGNOSIS — M79605 Pain in left leg: Secondary | ICD-10-CM

## 2019-05-08 DIAGNOSIS — E669 Obesity, unspecified: Secondary | ICD-10-CM

## 2019-05-08 DIAGNOSIS — O99215 Obesity complicating the puerperium: Secondary | ICD-10-CM

## 2019-05-08 NOTE — Progress Notes (Signed)
Pt reports that since she left the hospital her legs hurt a lot and swell.   Marland Kitchen.Post Partum Exam  Kelsey Swanson is a 49 y.o. [phone] female who presents for a postpartum visit. She is 8 weeks postpartum following a low cervical transverse Cesarean section. I have fully reviewed the prenatal and intrapartum course. The delivery was at 37.2 gestational weeks.  Anesthesia: spinal. Postpartum course has been good. Baby's course has been good. Baby is feeding by bottle Kelsey Swanson. Bleeding no bleeding. Bowel function is abnormal: sometimes constipated. Bladder function is normal. Patient is sexually active. Contraception method is tubal ligation. Postpartum depression screening:neg  The following portions of the patient's history were reviewed and updated as appropriate: allergies, current medications, past family history, past medical history, past social history, past surgical history and problem list. Last pap smear done 05-01-2018 and was Normal  Review of Systems A comprehensive review of systems was negative except for: Musculoskeletal: positive for leg pain bilaterally    Objective:  Blood pressure 118/79, pulse 79, weight 215 lb (97.5 kg), not currently breastfeeding.  General:  alert and no distress   Breasts:  inspection negative, no nipple discharge or bleeding, no masses or nodularity palpable  Lungs: clear to auscultation bilaterally  Heart:  regular rate and rhythm, S1, S2 normal, no murmur, click, rub or gallop  Abdomen: soft, non-tender; bowel sounds normal; no masses,  no organomegaly                Extremities:  Negative for calf tenderness or edema  Assessment:    1. Postpartum care following vaginal delivery  2. Pain in both lower extremities - low suspicion for DVT - will follow clinically for now  3. Obesity (BMI 35.0-39.9 without comorbidity) - program of caloric reduction, exercise and behavioral modification recommended   Plan:   1. Contraception: tubal  ligation 2. Continue PNV's 3. Follow up in: 2 weeks or as needed.   [name], MD 05/08/2019 11:03 AM

## 2019-05-17 ENCOUNTER — Other Ambulatory Visit: Payer: Self-pay | Admitting: Obstetrics

## 2019-05-17 ENCOUNTER — Telehealth: Payer: Self-pay

## 2019-05-17 DIAGNOSIS — R52 Pain, unspecified: Secondary | ICD-10-CM

## 2019-05-17 MED ORDER — IBUPROFEN 800 MG PO TABS: 800.0000 mg | ORAL_TABLET | Freq: Three times a day (TID) | ORAL | 5 refills | Status: DC | PRN

## 2019-05-17 NOTE — Telephone Encounter (Signed)
RC to pt regarding vm that she has not been able to pick up Rx because it was not sent  Not seeing note by MD on Rx management  LMOVM. For pt to make [place] aware of what Rx she was referring to.

## 2019-05-18 ENCOUNTER — Emergency Department (HOSPITAL_BASED_OUTPATIENT_CLINIC_OR_DEPARTMENT_OTHER): Payer: Medicaid Other

## 2019-05-18 ENCOUNTER — Emergency Department (HOSPITAL_COMMUNITY)
Admission: EM | Admit: 2019-05-18 | Discharge: 2019-05-18 | Disposition: A | Discharge status: Home / Self Care | Payer: Medicaid Other | Attending: Emergency Medicine | Admitting: Emergency Medicine

## 2019-05-18 ENCOUNTER — Encounter (HOSPITAL_COMMUNITY): Payer: Self-pay | Admitting: Emergency Medicine

## 2019-05-18 ENCOUNTER — Other Ambulatory Visit: Payer: Self-pay

## 2019-05-18 DIAGNOSIS — Z87891 Personal history of nicotine dependence: Secondary | ICD-10-CM | POA: Diagnosis not present

## 2019-05-18 DIAGNOSIS — R52 Pain, unspecified: Secondary | ICD-10-CM

## 2019-05-18 DIAGNOSIS — M7989 Other specified soft tissue disorders: Secondary | ICD-10-CM | POA: Diagnosis not present

## 2019-05-18 DIAGNOSIS — M79605 Pain in left leg: Secondary | ICD-10-CM | POA: Insufficient documentation

## 2019-05-18 DIAGNOSIS — M79604 Pain in right leg: Secondary | ICD-10-CM | POA: Diagnosis not present

## 2019-05-18 DIAGNOSIS — Z79899 Other long term (current) drug therapy: Secondary | ICD-10-CM | POA: Diagnosis not present

## 2019-05-18 LAB — BASIC METABOLIC PANEL
Anion gap: 11 (ref 5–15)
BUN: 21 mg/dL — ABNORMAL HIGH (ref 6–20)
CO2: 23 mmol/L (ref 22–32)
Calcium: 9 mg/dL (ref 8.9–10.3)
Chloride: 102 mmol/L (ref 98–111)
Creatinine, Ser: 0.46 mg/dL (ref 0.44–1.00)
GFR calc Af Amer: >60 mL/min (ref >60)
GFR calc non Af Amer: >60 mL/min (ref >60)
Glucose, Bld: 111 mg/dL — ABNORMAL HIGH (ref 70–99)
Potassium: 3.6 mmol/L (ref 3.5–5.1)
Sodium: 136 mmol/L (ref 135–145)

## 2019-05-18 LAB — CBC
HCT: 38.4 % (ref 36.0–46.0)
Hemoglobin: 12.3 g/dL (ref 12.0–15.0)
MCH: 27.7 pg (ref 26.0–34.0)
MCHC: 32 g/dL (ref 30.0–36.0)
MCV: 86.5 fL (ref 80.0–100.0)
Platelets: 209 10*3/uL (ref 150–400)
RBC: 4.44 MIL/uL (ref 3.87–5.11)
RDW: 13.2 % (ref 11.5–15.5)
WBC: 6 10*3/uL (ref 4.0–10.5)
nRBC: 0 % (ref 0.0–0.2)

## 2019-05-18 LAB — BRAIN NATRIURETIC PEPTIDE: B Natriuretic Peptide: 7.9 pg/mL (ref 0.0–100.0)

## 2019-05-18 MED ORDER — METHOCARBAMOL 500 MG PO TABS: 500.0000 mg | ORAL_TABLET | Freq: Two times a day (BID) | ORAL | 0 refills | Status: DC

## 2019-05-18 NOTE — ED Provider Notes (Signed)
Accepted handoff at shift change from [facility]. Please see prior provider note for more detail.   Briefly: Patient is 49 y.o. she is 8 weeks postpartum.  Has bilateral lower extremity pain and swelling.  Physical exam unremarkable.  She does not appear to have any notable unilateral swelling.  No tenderness to palpation of calves or thighs.  Previous provider had concern for DVT versus lower extremity edema from postpartum cardiomyopathy and obtain a BNP to rule this out.   Plan: Plan is to follow-up on DVT study and BMP.  Will disposition appropriately.        Physical Exam  BP 111/71   Pulse 82   Temp 98 F (36.7 C) (Oral)   Resp 18   Ht 5\' 4"  (1.626 m)   Wt 113.4 kg   SpO2 100%   BMI 42.91 kg/m   CONSTITUTIONAL:  well-appearing, NAD, 49 year old female in no acute distress NEURO:  Alert and oriented x 3, no focal deficits EYES:  pupils equal and reactive ENT/NECK:  trachea midline, no JVD CARDIO:  reg rate, reg rhythm, well-perfused PULM:  None labored breathing GI/GU:  Abdomen non-distended MSK/SPINE:  No gross deformities, no edema, no appreciable lower extremity swelling.  No calf tenderness, negative Homan sign.  No cellulitis or induration or erythema of lower extremities.  No bony tenderness.  No crepitus of soft tissue. SKIN:  no rash obvious, atraumatic, no ecchymosis  PSYCH:  Appropriate speech and behavior   ED Course/Procedures   Clinical Course as of May 17 916  Fri May 18, 2019  0517 After speaking with the patient, if seems that most of her pain may be related to sciatica or other MSK etiology, possibly precipitated by pregnancy. Patient also obese.  No red flags or signs concerning for cauda equina.  Low suspicion for postpartum CHF; she does not appear fluid overloaded. Denies CP, SOB. No pitting edema at present. OBGYN apparently expressed concern, but low suspicion, for DVT. Will hold in the department for venous duplex in the AM.   [KH]  [phone] I  personally reviewed results of blood work and LE ultrasound. No DVT agree with read.   [WF]    Clinical Course User Index [KH] Antonietta Breach, PA-C [WF] Tedd Sias, [place]    Procedures  Results for orders placed or performed during the hospital encounter of 05/18/19  Brain natriuretic peptide  Result Value Ref Range   B Natriuretic Peptide 7.9 0.0 - 100.0 pg/mL  CBC  Result Value Ref Range   WBC 6.0 4.0 - 10.5 K/uL   RBC 4.44 3.87 - 5.11 MIL/uL   Hemoglobin 12.3 12.0 - 15.0 g/dL   HCT 38.4 36.0 - 46.0 %   MCV 86.5 80.0 - 100.0 fL   MCH 27.7 26.0 - 34.0 pg   MCHC 32.0 30.0 - 36.0 g/dL   RDW 13.2 11.5 - 15.5 %   Platelets 209 150 - 400 K/uL   nRBC 0.0 0.0 - 0.2 %  Basic metabolic panel  Result Value Ref Range   Sodium 136 135 - 145 mmol/L   Potassium 3.6 3.5 - 5.1 mmol/L   Chloride 102 98 - 111 mmol/L   CO2 23 22 - 32 mmol/L   Glucose, Bld 111 (H) 70 - 99 mg/dL   BUN 21 (H) 6 - 20 mg/dL   Creatinine, Ser 0.46 0.44 - 1.00 mg/dL   Calcium 9.0 8.9 - 10.3 mg/dL   GFR calc non Af Amer >60 >60 mL/min  GFR calc Af Amer >60 >60 mL/min   Anion gap 11 5 - 15   VAS [place] LOWER EXTREMITY VENOUS (DVT) (ONLY [facility] & [facility])  Result Date: 05/18/2019  Lower Venous DVTStudy Indications: Pain, Swelling, and Two months post partum.  Limitations: Poor ultrasound/tissue interface. Comparison Study: No prior study Performing Technologist: Maudry Mayhew MHA, RDMS, RVT, RDCS  Examination Guidelines: A complete evaluation includes B-mode imaging, spectral Doppler, color Doppler, and power Doppler as needed of all accessible portions of each vessel. Bilateral testing is considered an integral part of a complete examination. Limited examinations for reoccurring indications may be performed as noted. The reflux portion of the exam is performed with the patient in reverse Trendelenburg.  +---------+---------------+---------+-----------+----------+--------------+ RIGHT     CompressibilityPhasicitySpontaneityPropertiesThrombus Aging +---------+---------------+---------+-----------+----------+--------------+ CFV      Full           Yes      Yes                                 +---------+---------------+---------+-----------+----------+--------------+ SFJ      Full                                                        +---------+---------------+---------+-----------+----------+--------------+ FV Prox  Full                                                        +---------+---------------+---------+-----------+----------+--------------+ FV Mid   Full                                                        +---------+---------------+---------+-----------+----------+--------------+ FV DistalFull                                                        +---------+---------------+---------+-----------+----------+--------------+ PFV      Full                                                        +---------+---------------+---------+-----------+----------+--------------+ POP      Full           Yes      Yes                                 +---------+---------------+---------+-----------+----------+--------------+ PTV      Full                                                        +---------+---------------+---------+-----------+----------+--------------+  PERO     Full                                                        +---------+---------------+---------+-----------+----------+--------------+   +---------+---------------+---------+-----------+----------+--------------+ LEFT     CompressibilityPhasicitySpontaneityPropertiesThrombus Aging +---------+---------------+---------+-----------+----------+--------------+ CFV      Full           Yes      Yes                                 +---------+---------------+---------+-----------+----------+--------------+ SFJ      Full                                                         +---------+---------------+---------+-----------+----------+--------------+ FV Prox  Full                                                        +---------+---------------+---------+-----------+----------+--------------+ FV Mid   Full                                                        +---------+---------------+---------+-----------+----------+--------------+ FV DistalFull                                                        +---------+---------------+---------+-----------+----------+--------------+ PFV      Full                                                        +---------+---------------+---------+-----------+----------+--------------+ POP      Full           Yes      Yes                                 +---------+---------------+---------+-----------+----------+--------------+ PTV      Full                                                        +---------+---------------+---------+-----------+----------+--------------+   Left Technical Findings: Not visualized segments include peroneal veins.   Summary: RIGHT: - There is no evidence of deep vein thrombosis in the lower extremity.  - No cystic structure found in the popliteal fossa.  LEFT: - There is no  evidence of deep vein thrombosis in the lower extremity. However, portions of this examination were limited- see technologist comments above.  - No cystic structure found in the popliteal fossa.  *See table(s) above for measurements and observations.    Preliminary      MDM   DVT scan of bilateral lower extremities without of any evidence of clot or abnormalities.  No hematomas.  BMP within normal limits.  We will treat conservatively at this time.  Will have patient follow-up with PCP.  Suspect that this is muscular injury.  After discussing with patient I did consider rhabdomyolysis however she has had no recent injuries or excessive workouts.  She is states that she is well-hydrated.   Denies any muscle cramping.  We will discharge with recommendations use Tylenol and ibuprofen for pain.  Recommend stretching, gentle exercise and hydration.  She will follow up with PCP.  She is agreeable to plan this time.  She will return to ED if she has any new or concerning symptoms.  Discharged with Robaxin.    Pati Gallo [place], [place] 05/18/19 0919    Tegeler, Gwenyth Allegra, MD 05/18/19 [phone]

## 2019-05-18 NOTE — Discharge Instructions (Signed)
Please rest, stretch, hydrate and drink plenty of water.  Please use warm salt water soaks and do gentle exercise only.  Please follow-up with your primary care doctor.  Please take Robaxin as needed for pain.  Please use Tylenol or ibuprofen for pain.  You may use 600 mg ibuprofen every 6 hours or 1000 mg of Tylenol every 6 hours.  You may choose to alternate between the 2.  This would be most effective.  Not to exceed 4 g of Tylenol within 24 hours.  Not to exceed 3200 mg ibuprofen 24 hours.  Please return to ED if you have new or concerning symptoms.

## 2019-05-18 NOTE — ED Notes (Signed)
Care endorsed to [place], [place]

## 2019-05-18 NOTE — ED Notes (Signed)
Patient given discharge instructions patient verbalizes understanding. 

## 2019-05-18 NOTE — ED Provider Notes (Signed)
[facility] EMERGENCY DEPARTMENT Provider Note   CSN: [id number] Arrival date & time: 05/18/19  2244     History Chief Complaint  Patient presents with  . Leg Pain    Kelsey Swanson is a 49 y.o. female.   49 y.o. [id number] female, ~8 weeks postpartum, with hx of chronic pancreatitis presents to the ED for BLE pain.  Patient states that she feels pain in her upper legs and thighs.  Pain will occasionally originate from her bilateral hips and low back.  It usually radiates into her bilateral lower extremities.  Discomfort aggravated with standing for long periods of time as well as ambulation.  She has noted some swelling in her legs as well, stating that sometimes her shoes will feel tight.  She has been taking ibuprofen for pain without relief.  Was advised to seek evaluation in the ED by her doctor.  She has not had any fevers, syncope, chest pain, shortness of breath, bowel or bladder incontinence, genital numbness, extremity weakness, inability to ambulate.   Leg Pain      Past Medical History:  Diagnosis Date  . Abnormal Pap smear 1996  . Fatty liver   . Headache   . Pancreatitis   . Pancreatitis, chronic ([place])   . Thrombocytopenia ([place])   . Vaginal Pap smear, abnormal     Patient Active Problem List   Diagnosis Date Noted  . Screening breast examination 03/29/2019  . Breast lump on right side at 11 o'clock position 03/29/2019  . Postpartum care following vaginal delivery 03/15/2019  . Status post bilateral salpingectomy 03/15/2019  . PROM (premature rupture of membranes) 03/13/2019  . Group B streptococcal infection during pregnancy 03/13/2019  . 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 Carpal tunnel syndrome, bilateral 11/14/2018  . Supervision of high risk pregnancy, antepartum 09/19/2018  . Slow transit constipation 09/19/2018  . Obesity in pregnancy with antepartum complication 12/25/1115  . Language barrier 09/19/2018  . Mass of  breast, right 09/19/2018  . AMA (advanced maternal age) multigravida 29+, unspecified trimester 09/18/2018  . Acute pancreatitis 07/02/2018  . Alcohol dependence in early, early partial, sustained full, or sustained partial remission ([place]) 07/01/2018  . Depression 07/01/2018  . Acute alcoholic hepatitis 35/67/0141  . Acute alcoholic pancreatitis 04/22/3141  . Fatty liver 07/23/2016  . Obesity (BMI 30.0-34.9) 07/23/2016    Past Surgical History:  Procedure Laterality Date  . BREAST EXCISIONAL BIOPSY Right   . BREAST SURGERY     right breast  . ECTOPIC PREGNANCY SURGERY  2012  . TUBAL LIGATION Bilateral 03/14/2019   Procedure: POST PARTUM TUBAL LIGATION;  Surgeon: Caren Macadam, MD;  Location: [facility] LD [facility]S;  Service: Gynecology;  Laterality: Bilateral;     OB History    Gravida  6   Para  4   Term  4   Preterm      AB  2   Living  4     SAB  1   TAB      Ectopic  1   Multiple  0   Live Births  4           Family History  Problem Relation Age of Onset  . Diabetes Father   . Diabetes Mother   . Diabetes Sister   . Diabetes Maternal Aunt   . Ovarian cancer Maternal Aunt   . Diabetes Maternal Grandmother   . Diabetes Maternal Uncle   . Breast cancer Neg Hx  Social History   Tobacco Use  . Smoking status: Former Smoker    Packs/day: 0.30    Years: 10.00    Pack years: 3.00    Types: Cigarettes    Quit date: 06/07/2015    Years since quitting: 3.9  . Smokeless tobacco: Never Used  Substance Use Topics  . Alcohol use: Not Currently    Comment:  May 2020  . Drug use: No    Home Medications Prior to Admission medications   Medication Sig Start Date End Date Taking? Authorizing Provider  Ascorbic Acid (VITAMIN C) 1000 MG tablet Take 1,000 mg by mouth daily.    [provider]  Blood Pressure KIT Monitor BP at home regularly. large Cuff   DX:O09.90 Patient not taking: Reported on 03/20/2019 09/19/18   Kelsey Jude, MD   Calcium-Vitamin D-Vitamin K (VIACTIV CALCIUM PLUS D) 650-12.5-40 MG-MCG-MCG CHEW Chew 1 each by mouth daily.    [provider]  docusate sodium (COLACE) 100 MG capsule Take 1 capsule (100 mg total) by mouth 2 (two) times daily as needed. Patient not taking: Reported on 03/20/2019 09/19/18   Kelsey Jude, MD  Elastic Bandages & Supports (COMFORT FIT MATERNITY SUPP SM) [place] Backache Patient not taking: Reported on 03/20/2019 03/08/19   Kelsey Bombard, MD  hydrocortisone 1 % lotion Apply 1 application topically 2 (two) times daily. Patient not taking: Reported on 05/08/2019 03/20/19   Kelsey Bombard, MD  ibuprofen (ADVIL) 600 MG tablet Take 1 tablet (600 mg total) by mouth every 6 (six) hours. Patient not taking: Reported on 03/20/2019 03/15/19   Kelsey Swanson, CNM  ibuprofen (ADVIL) 800 MG tablet Take 1 tablet (800 mg total) by mouth every 8 (eight) hours as needed. 05/17/19   Kelsey Bombard, MD  oxyCODONE-acetaminophen (PERCOCET/ROXICET) 5-325 MG tablet Take 1 tablet by mouth every 4 (four) hours as needed for moderate pain (pain scale 4-7). Patient not taking: Reported on 05/08/2019 03/15/19   Kelsey Swanson, CNM  Prenatal Vit-Fe Fumarate-FA (PRENATAL MULTIVITAMIN) TABS tablet Take 1 tablet by mouth daily at 12 noon.    [provider]    Allergies    Patient has no known allergies.  Review of Systems   Review of Systems  Ten systems reviewed and are negative for acute change, except as noted in the HPI.    Physical Exam Updated Vital Signs BP 110/71   Pulse 64   Temp 98 F (36.7 C) (Oral)   Resp 18   Ht '5\' 4"'$  (1.626 m)   Wt 113.4 kg   SpO2 97%   BMI 42.91 kg/m   Physical Exam Vitals and nursing note reviewed.  Constitutional:      General: She is not in acute distress.    Appearance: She is well-developed. She is not diaphoretic.     Comments: Nontoxic appearing and in NAD  HENT:     Head: Normocephalic and atraumatic.  Eyes:     General: No  scleral icterus.    Conjunctiva/sclera: Conjunctivae normal.  Cardiovascular:     Rate and Rhythm: Normal rate and regular rhythm.     Pulses: Normal pulses.     Comments: DP pulse 2+ in BLE Pulmonary:     Effort: Pulmonary effort is normal. No respiratory distress.     Comments: Respirations even and unlabored Musculoskeletal:        General: Normal range of motion.     Cervical back: Normal range of motion.  Comments: No pitting edema in BLE  Skin:    General: Skin is warm and dry.     Coloration: Skin is not pale.     Findings: No erythema or rash.  Neurological:     Mental Status: She is alert and oriented to person, place, and time.     Comments: Moving all extremities spontaneously. Sensation intact and equal in BLE.  Psychiatric:        Behavior: Behavior normal.     ED Results / Procedures / Treatments   Labs (all labs ordered are listed, but only abnormal results are displayed) Labs Reviewed  BASIC METABOLIC PANEL - Abnormal; Notable for the following components:      Result Value   Glucose, Bld 111 (*)    BUN 21 (*)    All other components within normal limits  CBC  BRAIN NATRIURETIC PEPTIDE    EKG None  Radiology No results found.  Procedures Procedures (including critical care time)  Medications Ordered in ED Medications - No data to display  ED Course  I have reviewed the triage vital signs and the nursing notes.  Pertinent labs & imaging results that were available during my care of the patient were reviewed by me and considered in my medical decision making (see chart for details).  Clinical Course as of May 18 639  Fri May 18, 2019  0517 After speaking with the patient, if seems that most of her pain may be related to sciatica or other MSK etiology, possibly precipitated by pregnancy. Patient also obese.  No red flags or signs concerning for cauda equina.  Low suspicion for postpartum CHF; she does not appear fluid overloaded. Denies CP,  SOB. No pitting edema at present. OBGYN apparently expressed concern, but low suspicion, for DVT. Will hold in the department for venous duplex in the AM.   [KH]    Clinical Course User Index [KH] Antonietta Breach, PA-C   MDM Rules/Calculators/A&P                      49 year old female approximately 8 weeks postpartum presents to the emergency department complaining of bilateral leg pain which is aggravated by standing for long hours as well as ambulation.  She reports lower extremity swelling associated with her symptoms, but has no pitting edema on exam.  Does not appear fluid overloaded.  She has no chest pain, shortness of breath, syncope or near syncope.  Low suspicion for CHF, though BNP pending for screening.  Given recent pregnancy and delivery, will obtain ultrasounds to rule out lower extremity DVT.  Her symptoms, however, sound more consistent with musculoskeletal etiology versus sciatica.  If ultrasound and BNP reassuring, feel the patient is stable for continued outpatient follow-up.  Case signed out to [place], PA-C at shift change.   Final Clinical Impression(s) / ED Diagnoses Final diagnoses:  Bilateral leg pain    Rx / DC Orders ED Discharge Orders    None       Antonietta Breach, PA-C 05/18/19 1610    Ripley Fraise, MD 05/18/19 [phone]

## 2019-05-18 NOTE — ED Notes (Signed)
EDP at bedside  

## 2019-05-18 NOTE — ED Triage Notes (Addendum)
Pt reports she has a 18 month old and since her delivery, she has had bilateral leg pain and swelling. She states she has seen her pcp and was given extra strength motrin but she reports it is not helping,she called them back and was told to come to the ED for further eval. She states that sometimes pain is worse in the mornings when she first gets up and especially when she stands for a while. She denies factors that improve her pain. Denies sob.

## 2019-05-18 NOTE — ED Notes (Signed)
PIV initiated, 20G to [place]. IV flushes with 10cc NS without s/s of infiltration. Positive blood return noted. Secured with tape and tegaderm. Labs drawn, labeled with 2 pt identifiers, and sent

## 2019-05-18 NOTE — Progress Notes (Signed)
Bilateral lower extremity venous duplex completed. Refer to "CV Proc" under chart review to view preliminary results.  05/18/2019 8:52 AM Kelby Aline., MHA, RVT, RDCS, RDMS

## 2019-05-18 NOTE — ED Notes (Signed)
Pt wheeled to ED RM 34 from [place]. Pt endorses pain and swelling from hips to feet on BLE. Pt states pain is worse with standing and walking. Reports symptoms started 2 months ago after she delivered her baby. States she was sent here to rule out DVT by her PCP.

## 2019-05-18 NOTE — ED Notes (Signed)
Pt back from vascular.

## 2019-05-22 ENCOUNTER — Ambulatory Visit: Payer: Medicaid Other | Admitting: Obstetrics

## 2019-06-14 DIAGNOSIS — I5189 Other ill-defined heart diseases: Secondary | ICD-10-CM | POA: Insufficient documentation

## 2019-10-04 ENCOUNTER — Ambulatory Visit
Admission: RE | Admit: 2019-10-04 | Discharge: 2019-10-04 | Disposition: A | Discharge status: Home / Self Care | Payer: No Typology Code available for payment source | Source: Ambulatory Visit | Attending: Obstetrics and Gynecology | Admitting: Obstetrics and Gynecology

## 2019-10-04 ENCOUNTER — Other Ambulatory Visit: Payer: Self-pay

## 2019-10-04 DIAGNOSIS — R2231 Localized swelling, mass and lump, right upper limb: Secondary | ICD-10-CM

## 2020-02-04 ENCOUNTER — Encounter: Payer: Self-pay | Admitting: General Practice

## 2020-05-06 ENCOUNTER — Telehealth: Payer: Self-pay | Admitting: Internal Medicine

## 2020-05-06 NOTE — Telephone Encounter (Signed)
Pt hasn't been seen since 2019. Please schedule pt a sooner appt or refer to mobile bus

## 2020-05-06 NOTE — Telephone Encounter (Signed)
Prescribed for hand and knee pain before pregnancy. Pt no longer pregnant but still having pain.   naproxen (NAPROSYN) 500 MG tablet [[id number]] [place], [place].  [address] [place] [address]  Phone:  [phone] Fax:  [phone]

## 2020-06-11 ENCOUNTER — Encounter: Payer: Self-pay | Admitting: Physician Assistant

## 2020-06-11 ENCOUNTER — Other Ambulatory Visit: Payer: Self-pay

## 2020-06-11 ENCOUNTER — Ambulatory Visit: Payer: Medicaid Other | Attending: Physician Assistant | Admitting: Physician Assistant

## 2020-06-11 DIAGNOSIS — M79643 Pain in unspecified hand: Secondary | ICD-10-CM

## 2020-06-11 DIAGNOSIS — Z789 Other specified health status: Secondary | ICD-10-CM | POA: Diagnosis not present

## 2020-06-11 DIAGNOSIS — R7303 Prediabetes: Secondary | ICD-10-CM | POA: Diagnosis not present

## 2020-06-11 DIAGNOSIS — Z758 Other problems related to medical facilities and other health care: Secondary | ICD-10-CM

## 2020-06-11 MED ORDER — NAPROXEN 500 MG PO TABS: 500.0000 mg | ORAL_TABLET | Freq: Two times a day (BID) | ORAL | 1 refills | Status: DC

## 2020-06-11 NOTE — Progress Notes (Signed)
Patient ID: Kelsey Swanson, female   DOB: November 19, 1970, 50 y.o.   MRN: [id number] Virtual Visit via Telephone Note  I connected with Kelsey Swanson on 06/11/20 at  2:10 PM EDT by telephone and verified that I am speaking with the correct person using two identifiers.  Location: Patient: home Provider: [facility] office Cletus Gash with [place] interpreters   I discussed the limitations, risks, security and privacy concerns of performing an evaluation and management service by telephone and the availability of in person appointments. I also discussed with the patient that there may be a patient responsible charge related to this service. The patient expressed understanding and agreed to proceed.   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  This is a chronic problem.  No blood work since 2021.  Denies polyuria, polydipsia.  Not taking metformin.  Does not check blood sugars.      Observations/Objective:  NAD.  A&Ox3   Assessment and Plan: 1. Pain of hand, unspecified laterality - naproxen (NAPROSYN) 500 MG tablet; Take 1 tablet (500 mg total) by mouth 2 (two) times daily with a meal. Prn pain  Dispense: 60 tablet; Refill: 1  2. Prediabetes I have had a lengthy discussion and provided education about insulin resistance and the intake of too much sugar/refined carbohydrates.  I have advised the patient to work at a goal of eliminating sugary drinks, candy, desserts, sweets, refined sugars, processed foods, and white carbohydrates.  The patient expresses understanding.  - Hemoglobin A1c; Future - Comprehensive metabolic panel; Future  3. Language barrier [place] interpreters used and additional time performing visit was required.  Follow Up Instructions: See PCP in 3 months   I discussed the assessment and treatment plan with the patient. The patient was provided an opportunity to ask questions and all were answered. The patient agreed  with the plan and demonstrated an understanding of the instructions.   The patient was advised to call back or seek an in-person evaluation if the symptoms worsen or if the condition fails to improve as anticipated.  I provided 13 minutes of non-face-to-face time during this encounter.   Freeman Caldron, PA-C

## 2020-09-18 ENCOUNTER — Encounter: Payer: Self-pay | Admitting: Internal Medicine

## 2020-09-18 ENCOUNTER — Ambulatory Visit: Payer: Medicaid Other | Attending: Internal Medicine | Admitting: Internal Medicine

## 2020-09-18 ENCOUNTER — Other Ambulatory Visit: Payer: Self-pay

## 2020-09-18 VITALS — BP 120/80 | HR 82 | Resp 16 | Wt 188.4 lb

## 2020-09-18 DIAGNOSIS — E669 Obesity, unspecified: Secondary | ICD-10-CM

## 2020-09-18 DIAGNOSIS — Z1211 Encounter for screening for malignant neoplasm of colon: Secondary | ICD-10-CM

## 2020-09-18 DIAGNOSIS — Z6830 Body mass index (BMI) 30.0-30.9, adult: Secondary | ICD-10-CM | POA: Diagnosis not present

## 2020-09-18 DIAGNOSIS — Z8719 Personal history of other diseases of the digestive system: Secondary | ICD-10-CM | POA: Diagnosis not present

## 2020-09-18 DIAGNOSIS — Z87891 Personal history of nicotine dependence: Secondary | ICD-10-CM | POA: Diagnosis not present

## 2020-09-18 DIAGNOSIS — R1010 Upper abdominal pain, unspecified: Secondary | ICD-10-CM

## 2020-09-18 DIAGNOSIS — Z79899 Other long term (current) drug therapy: Secondary | ICD-10-CM | POA: Diagnosis not present

## 2020-09-18 DIAGNOSIS — R6882 Decreased libido: Secondary | ICD-10-CM | POA: Diagnosis not present

## 2020-09-18 DIAGNOSIS — K760 Fatty (change of) liver, not elsewhere classified: Secondary | ICD-10-CM | POA: Insufficient documentation

## 2020-09-18 DIAGNOSIS — K5900 Constipation, unspecified: Secondary | ICD-10-CM | POA: Insufficient documentation

## 2020-09-18 DIAGNOSIS — R42 Dizziness and giddiness: Secondary | ICD-10-CM | POA: Diagnosis not present

## 2020-09-18 DIAGNOSIS — R634 Abnormal weight loss: Secondary | ICD-10-CM | POA: Diagnosis not present

## 2020-09-18 MED ORDER — OMEPRAZOLE 20 MG PO CPDR: 20.0000 mg | DELAYED_RELEASE_CAPSULE | Freq: Every day | ORAL | 3 refills | Status: DC

## 2020-09-18 NOTE — Patient Instructions (Signed)
Kelsey Swanson Dizziness Los Kelsey Swanson son un problema muy frecuente. Causan sensacin de inestabilidad o de desvanecimiento. Puede sentir que se va a desmayar. Los [employer] pueden provocarle una lesin si se tropieza o se cae. La causa puede deberse a [employer], tales como los siguientes: Medicamentos. No tener suficiente agua en el cuerpo (deshidratacin). Enfermedad. Siga estas instrucciones en su casa: Comida y bebida  Beba suficiente lquido para mantener el pis (orina) de color amarillo plido. Esto evita la deshidratacin. Trate de beber ms lquidos transparentes, como agua. No beba alcohol. Limite la cantidad de cafena que bebe o come si el mdico se lo indica. Limite la cantidad de sal (sodio) que bebe o come si el mdico se lo indica.  Actividad  Evite los movimientos rpidos. Cuando se levante de una silla, hgalo con lentitud y sujtese hasta sentirse bien. Por la maana, sintese primero a un lado de la cama. Cuando se sienta bien, pngase lentamente de pie mientras se sostiene de algo. Haga esto hasta que se sienta seguro en cuanto al equilibrio. Mueva las piernas con frecuencia si debe estar de pie en un lugar durante mucho tiempo. Mientras est de pie, contraiga y relaje los msculos de las piernas. No conduzca vehculos ni opere maquinaria si se siente mareado. Evite agacharse si se siente mareado. En su casa, coloque los objetos en algn lugar que le resulte fcil alcanzarlos sin agacharse.  Estilo de vida No fume ni consuma ningn producto que contenga nicotina o tabaco. Si necesita ayuda para dejar de fumar, consulte al mdico. Intente bajar el nivel de estrs. Para hacerlo, puede usar mtodos como el yoga o la meditacin. Hable con el mdico si necesita ayuda. Instrucciones generales Controle sus Kelsey Swanson para ver si hay cambios. Use los medicamentos de venta libre y los recetados solamente como se lo haya indicado el mdico. Hable con el mdico si cree que la causa de sus  Kelsey Swanson es algn medicamento que est tomando. Infrmele a un amigo o a un familiar si se siente mareado. Pdale a esta persona que llame al mdico si observa cambios en su comportamiento. Concurra a [place]. Comunquese con un mdico si: Los [employer]. Los [employer] o la sensacin de Engineer, petroleum. Tiene ganas de vomitar (nuseas). Tiene problemas para escuchar. Aparecen nuevos sntomas. Siente inestabilidad al estar de pie. Siente que la Development worker, international aid vueltas. Dolor o rigidez en el cuello. Tiene fiebre. Solicite ayuda de inmediato si: Vomita o tiene heces acuosas (diarrea), y no puede comer o beber nada. Tiene dificultad para hacer lo siguiente: Hablar. Caminar. [place]. Usar los brazos, las [place] piernas. Se siente constantemente dbil. No piensa con claridad o tiene dificultad para armar oraciones. Es posible que un amigo o un familiar adviertan que esto ocurre. Tiene los siguientes sntomas: Tourist information centre manager. Dolor en el vientre (abdomen). Falta de aire. Sudoracin. Presenta cambios en la visin. Sangrado. Tiene un dolor de cabeza muy intenso. Estos sntomas pueden Sales executive. Solicite ayuda de inmediato. Comunquese con el servicio de emergencias de su localidad (911 en los [place]). No espere a ver si los sntomas desaparecen. No conduzca por sus propios medios [employer]. Resumen Los Kelsey Swanson causan sensacin de inestabilidad o de desvanecimiento. Puede sentir que se va a desmayar. Beba suficiente lquido para Contractor pis (orina) de color amarillo plido. No beba alcohol. Evite los movimientos rpidos si se siente mareado. Controle sus Kelsey Swanson para ver si hay cambios. Esta informacin no tiene  como fin reemplazar el consejo del mdico. Asegresede hacerle al mdico cualquier pregunta que tenga. Document Revised: 02/04/2020 Document Reviewed: 02/04/2020 Elsevier Patient Education  2022 [employer].

## 2020-09-18 NOTE — Progress Notes (Signed)
Patient ID: Kelsey Swanson, female    DOB: September 04, 1970  MRN: [id number]  CC: dizziness   Subjective: Kelsey Swanson is a 50 y.o. female who presents with several concerns Her concerns today include:  Patient with history of EtOH abuse,  Fatty liver, HL, CTS and alcoholic pancreatitis.  I last saw her 2020.  C/o intermittent dizziness for 1 mth.  Episodes last 10 mins when they come on.  Can come on when she bends over to pick up something. Endorses ringing in ears more RT side some times with dizziness, no hearing loss Drinks a lot of fluids during the day to get hydrated.  Urinates a lot because of this She does not drink ETOH beverages for >2 yrs  Hx of ETOH pancreatitis and fatty liver C/o pain in epigastric and RUQ when she drinks fluids and sometimes with solids but more so with liquids.with foods intermittently. More so with fluids. Lasts only a few seconds after she swallows. Pain is cramping in character BM range from diarrhea to constipation.  Drinks natural tea to help with both Blood in stools sometimes when constipated.  I note that her weight was 215 pounds in March of last year.  Today it is 188 pounds.  Patient reports the weight loss is not intentional.  She does not exercise.  She feels she has been eating a little less. Wgh 215 lb after having baby.   Endorses feeling hot all the time and sweats a lot.  No palpitations.  She is in the perimenopausal range.  Her other concern is lack of desire for intercourse for the past 5 months.  She does not feel depressed and reports no significant issues in her relationship with her significant other.    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   Carpal tunnel syndrome, bilateral 11/14/2018   Supervision of high risk pregnancy, antepartum 09/19/2018   Slow transit constipation 09/19/2018   Obesity in pregnancy with antepartum complication [id number]   Language barrier 09/19/2018   Mass of breast, right 09/19/2018   AMA (advanced maternal age) multigravida 35+, unspecified trimester 09/18/2018   Acute pancreatitis 07/02/2018   Alcohol dependence in early, early partial, sustained full, or sustained partial remission ([place]) 07/01/2018   Depression 0000000   Acute alcoholic hepatitis [id number]   Acute alcoholic pancreatitis [id number]   Fatty liver 07/23/2016   Obesity (BMI 30.0-34.9) 07/23/2016     Current Outpatient Medications on File Prior to Visit  Medication Sig Dispense Refill   Ascorbic Acid (VITAMIN C) 1000 MG tablet Take 1,000 mg by mouth daily.     Calcium-Vitamin D-Vitamin K (VIACTIV CALCIUM PLUS D) 650-12.5-40 MG-MCG-MCG CHEW Chew 1 each by mouth daily.     docusate sodium (COLACE) 100 MG capsule Take 1 capsule (100 mg total) by mouth 2 (two) times daily as needed. (Patient not taking: Reported on 03/20/2019) 30 capsule 2   methocarbamol (ROBAXIN) 500 MG tablet Take 1 tablet (500 mg total) by mouth 2 (two) times daily. 20 tablet 0   naproxen (NAPROSYN) 500 MG tablet Take 1 tablet (500 mg total) by mouth  2 (two) times daily with a meal. Prn pain 60 tablet 1   Prenatal Vit-Fe Fumarate-FA (PRENATAL MULTIVITAMIN) TABS tablet Take 1 tablet by mouth daily at 12 noon.     No current facility-administered medications on file prior to visit.    No Known Allergies  Social History   Socioeconomic History   Marital status: Married    Spouse name: Not on file   Number of children: Not on file   Years of education: Not on file   Highest education level: 6th grade  Occupational  History   Occupation: unemployed  Tobacco Use   Smoking status: Former    Packs/day: 0.30    Years: 10.00    Pack years: 3.00    Types: Cigarettes    Quit date: 06/07/2015    Years since quitting: 5.2   Smokeless tobacco: Never  Vaping Use   Vaping Use: Never used  Substance and Sexual Activity   Alcohol use: Not Currently    Comment:  May 2020   Drug use: No   Sexual activity: Yes    Partners: Male    Birth control/protection: Surgical  Other Topics Concern   Not on file  Social History Narrative   Not on file   Social Determinants of Health   Financial Resource Strain: Not on file  Food Insecurity: Not on file  Transportation Needs: Not on file  Physical Activity: Not on file  Stress: Not on file  Social Connections: Not on file  Intimate Partner Violence: Not on file    Family History  Problem Relation Age of Onset   Diabetes Father    Diabetes Mother    Diabetes Sister    Diabetes Maternal Aunt    Ovarian cancer Maternal Aunt    Diabetes Maternal Grandmother    Diabetes Maternal Uncle    Breast cancer Neg Hx     Past Surgical History:  Procedure Laterality Date   BREAST EXCISIONAL BIOPSY Right    BREAST SURGERY     right breast   ECTOPIC PREGNANCY SURGERY  2012   TUBAL LIGATION Bilateral 03/14/2019   Procedure: POST PARTUM TUBAL LIGATION;  Surgeon: Caren Macadam, MD;  Location: [facility] LD [facility]S;  Service: Gynecology;  Laterality: Bilateral;    ROS: Review of Systems Negative except as stated above  PHYSICAL EXAM: BP 120/80   Pulse 82   Resp 16   Wt 188 lb 6.4 oz (85.5 kg)   LMP  (LMP Unknown) Comment: gave birth 03/13/2019  SpO2 98%   BMI 32.34 kg/m   Wt Readings from Last 3 Encounters:  09/18/20 188 lb 6.4 oz (85.5 kg)  05/18/19 250 lb (113.4 kg)  05/08/19 215 lb (97.5 kg)    Physical Exam Sitting: BP 118/82, pulse 76 Standing: BP 110/77, pulse of 86 General appearance - alert, well appearing, middle-aged female and in no  distress Mental status - normal mood, behavior, speech, dress, motor activity, and thought processes Eyes -slightly pale conjunctiva Ears - bilateral TM's and external ear canals normal Mouth - mucous membranes moist, pharynx normal without lesions Neck - supple, no significant adenopathy Chest - clear to auscultation, no wheezes, rales or rhonchi, symmetric air entry Heart - normal rate, regular rhythm, normal S1, S2, no murmurs, rubs, clicks or gallops Abdomen - soft, nontender, nondistended, no masses or organomegaly Neurological - cranial nerves II through XII intact, motor and sensory grossly normal bilaterally.  Gait normal. Extremities - peripheral pulses normal, no pedal edema, no clubbing or  cyanosis   CMP Latest Ref Rng & Units 05/18/2019 09/19/2018 07/03/2018  Glucose 70 - 99 mg/dL 111(H) 94 93  BUN 6 - 20 mg/dL 21(H) 11 <5(L)  Creatinine 0.44 - 1.00 mg/dL 0.46 0.50(L) 0.53  Sodium 135 - 145 mmol/L 136 137 138  Potassium 3.5 - 5.1 mmol/L 3.6 4.1 3.2(L)  Chloride 98 - 111 mmol/L 102 101 100  CO2 22 - 32 mmol/L 23 19(L) 26  Calcium 8.9 - 10.3 mg/dL 9.0 9.2 8.7(L)  Total Protein 6.0 - 8.5 g/dL - 6.7 5.8(L)  Total Bilirubin 0.0 - 1.2 mg/dL - <0.2 0.6  Alkaline Phos 39 - 117 IU/L - 56 77  AST 0 - 40 IU/L - 12 22  ALT 0 - 32 IU/L - 11 38   Lipid Panel     Component Value Date/Time   CHOL 214 (H) 09/02/2017 1132   CHOL 174 05/30/2017 1010   TRIG 135 09/02/2017 1132   HDL 49 09/02/2017 1132   HDL 46 05/30/2017 1010   CHOLHDL 4.4 09/02/2017 1132   VLDL 27 09/02/2017 1132   LDLCALC 138 (H) 09/02/2017 1132   LDLCALC 106 (H) 05/30/2017 1010    CBC    Component Value Date/Time   WBC 6.0 05/18/2019 0520   RBC 4.44 05/18/2019 0520   HGB 12.3 05/18/2019 0520   HGB 11.7 01/12/2019 0813   HCT 38.4 05/18/2019 0520   HCT 36.3 01/12/2019 0813   PLT 209 05/18/2019 0520   PLT 190 01/12/2019 0813   MCV 86.5 05/18/2019 0520   MCV 86 01/12/2019 0813   MCH 27.7 05/18/2019 0520    MCHC 32.0 05/18/2019 0520   RDW 13.2 05/18/2019 0520   RDW 13.3 01/12/2019 0813   LYMPHSABS 2.3 09/19/2018 1409   MONOABS 1.3 (H) 04/11/2017 0351   EOSABS 0.1 09/19/2018 1409   BASOSABS 0.0 09/19/2018 1409    ASSESSMENT AND PLAN: 1. Dizziness Of questionable etiology.  She may have mild Mnire's given that she does have some ringing in the ears with the dizziness at times.  Other things that need to be ruled out include anemia and diabetes.  I will check some baseline blood test today.  Advised to go slow with position changes and to keep hydrated.  Further management will be based on results. - CBC - Hemoglobin A1c  2. Pain of upper abdomen Sounds as though she may be having esophageal spasm given that the pain lasts only for seconds after drinking fluids and sometimes with solids.  We will try her on omeprazole and refer to gastroenterology. - Ambulatory referral to Gastroenterology - omeprazole (PRILOSEC) 20 MG capsule; Take 1 capsule (20 mg total) by mouth daily.  Dispense: 30 capsule; Refill: 3  3. Unintended weight loss - CBC - Comprehensive metabolic panel - Hemoglobin A1c - TSH - HIV antibody (with reflex) - Ambulatory referral to Gastroenterology  4. Obesity (BMI 30-39.9) Patient still obese for height even though she has had some unintentional weight loss.  Work-up of the unintentional weight loss is planned as documented on the #3 above - Lipid panel  5. Decreased libido Advised patient that they are not good options out there for women with decreased sex drive.  She can try things like intentionally setting time aside for it and setting an atmosphere.  Also recommend that she make sure she is being adequately stimulated by her partner.  6. Screening for colon cancer - Ambulatory referral to Gastroenterology   Patient was given the opportunity to ask  questions.  Patient verbalized understanding of the plan and was able to repeat key elements of the plan.  [employer]  Language interpreter used during this encounter. [id number], Mickel Baas  Orders Placed This Encounter  Procedures   CBC   Comprehensive metabolic panel   Lipid panel   Hemoglobin A1c   TSH   HIV antibody (with reflex)   Ambulatory referral to Gastroenterology     Requested Prescriptions   Signed Prescriptions Disp Refills   omeprazole (PRILOSEC) 20 MG capsule 30 capsule 3    Sig: Take 1 capsule (20 mg total) by mouth daily.    Return in about 5 weeks (around 10/23/2020).  Karle Plumber, MD, FACP

## 2020-09-19 LAB — COMPREHENSIVE METABOLIC PANEL
ALT: 127 IU/L — ABNORMAL HIGH (ref 0–32)
AST: 88 IU/L — ABNORMAL HIGH (ref 0–40)
Albumin/Globulin Ratio: 1.8 (ref 1.2–2.2)
Albumin: 4.9 g/dL — ABNORMAL HIGH (ref 3.8–4.8)
Alkaline Phosphatase: 148 IU/L — ABNORMAL HIGH (ref 44–121)
BUN/Creatinine Ratio: 21 (ref 9–23)
BUN: 11 mg/dL (ref 6–24)
Bilirubin Total: 0.3 mg/dL (ref 0.0–1.2)
CO2: 24 mmol/L (ref 20–29)
Calcium: 9.8 mg/dL (ref 8.7–10.2)
Chloride: 101 mmol/L (ref 96–106)
Creatinine, Ser: 0.53 mg/dL — ABNORMAL LOW (ref 0.57–1.00)
Globulin, Total: 2.8 g/dL (ref 1.5–4.5)
Glucose: 76 mg/dL (ref 65–99)
Potassium: 4.1 mmol/L (ref 3.5–5.2)
Sodium: 138 mmol/L (ref 134–144)
Total Protein: 7.7 g/dL (ref 6.0–8.5)
eGFR: 115 mL/min/{1.73_m2} (ref >59)

## 2020-09-19 LAB — LIPID PANEL
Chol/HDL Ratio: 5.8 ratio — ABNORMAL HIGH (ref 0.0–4.4)
Cholesterol, Total: 184 mg/dL (ref 100–199)
HDL: 32 mg/dL — ABNORMAL LOW (ref >39)
LDL Chol Calc (NIH): 127 mg/dL — ABNORMAL HIGH (ref 0–99)
Triglycerides: 137 mg/dL (ref 0–149)
VLDL Cholesterol Cal: 25 mg/dL (ref 5–40)

## 2020-09-19 LAB — CBC
Hematocrit: 37 % (ref 34.0–46.6)
Hemoglobin: 12.5 g/dL (ref 11.1–15.9)
MCH: 30.5 pg (ref 26.6–33.0)
MCHC: 33.8 g/dL (ref 31.5–35.7)
MCV: 90 fL (ref 79–97)
Platelets: 309 10*3/uL (ref 150–450)
RBC: 4.1 x10E6/uL (ref 3.77–5.28)
RDW: 13.1 % (ref 11.7–15.4)
WBC: 8.7 10*3/uL (ref 3.4–10.8)

## 2020-09-19 LAB — HEMOGLOBIN A1C
Est. average glucose Bld gHb Est-mCnc: 117 mg/dL
Hgb A1c MFr Bld: 5.7 % — ABNORMAL HIGH (ref 4.8–5.6)

## 2020-09-19 LAB — HIV ANTIBODY (ROUTINE TESTING W REFLEX): HIV Screen 4th Generation wRfx: NONREACTIVE

## 2020-09-19 LAB — TSH: TSH: 1.51 u[IU]/mL (ref 0.450–4.500)

## 2020-09-20 ENCOUNTER — Other Ambulatory Visit: Payer: Self-pay | Admitting: Internal Medicine

## 2020-09-20 DIAGNOSIS — R7989 Other specified abnormal findings of blood chemistry: Secondary | ICD-10-CM

## 2020-09-20 DIAGNOSIS — R945 Abnormal results of liver function studies: Secondary | ICD-10-CM

## 2020-09-26 ENCOUNTER — Telehealth: Payer: Self-pay | Admitting: Internal Medicine

## 2020-09-26 NOTE — Telephone Encounter (Signed)
Patient inquiring about most recent lab results, please call patient directly at [phone]. Patient was able to view on My Chart but does not understand results

## 2020-09-26 NOTE — Telephone Encounter (Signed)
Spoke w pt . Results reviewd

## 2020-10-01 ENCOUNTER — Ambulatory Visit: Payer: Medicaid Other | Attending: Family Medicine

## 2020-10-01 ENCOUNTER — Other Ambulatory Visit: Payer: Self-pay

## 2020-10-01 DIAGNOSIS — R945 Abnormal results of liver function studies: Secondary | ICD-10-CM | POA: Diagnosis not present

## 2020-10-02 ENCOUNTER — Other Ambulatory Visit: Payer: Self-pay | Admitting: Internal Medicine

## 2020-10-02 DIAGNOSIS — R7989 Other specified abnormal findings of blood chemistry: Secondary | ICD-10-CM

## 2020-10-02 DIAGNOSIS — R945 Abnormal results of liver function studies: Secondary | ICD-10-CM

## 2020-10-02 LAB — ACUTE VIRAL HEPATITIS (HAV, HBV, HCV)
HCV Ab: <0.1 s/co ratio (ref 0.0–0.9)
Hep A IgM: NEGATIVE
Hep B C IgM: NEGATIVE
Hepatitis B Surface Ag: NEGATIVE

## 2020-10-02 LAB — HCV INTERPRETATION

## 2020-10-02 NOTE — Progress Notes (Signed)
Let pt know that her liver function test was abnormal with levels elevated.  I subsequently ordered labs to screen for viral hepatitis A, B and C.  These screens were negative.  I would like to do an imaging study called liver ultra sound to look at the liver.  Order placed.  Please schedule for pt.

## 2020-10-23 ENCOUNTER — Ambulatory Visit: Payer: Medicaid Other | Attending: Internal Medicine | Admitting: Internal Medicine

## 2020-10-23 ENCOUNTER — Other Ambulatory Visit: Payer: Self-pay

## 2020-10-23 DIAGNOSIS — R7989 Other specified abnormal findings of blood chemistry: Secondary | ICD-10-CM

## 2020-10-23 DIAGNOSIS — Z1211 Encounter for screening for malignant neoplasm of colon: Secondary | ICD-10-CM

## 2020-10-23 DIAGNOSIS — R945 Abnormal results of liver function studies: Secondary | ICD-10-CM

## 2020-10-23 NOTE — Progress Notes (Signed)
Patient ID: Kelsey Swanson, female   DOB: May 05, 1970, 50 y.o.   MRN: [id number] Virtual Visit via Telephone Note  I connected with Kelsey Swanson on 10/23/2020 at 2:40 PM by telephone and verified that I am speaking with the correct person using two identifiers  Location: Patient: home Provider: office  Participants: Myself Patient [place] interpreter: Madalyn Rob [id number]   I discussed the limitations, risks, security and privacy concerns of performing an evaluation and management service by telephone and the availability of in person appointments. I also discussed with the patient that there may be a patient responsible charge related to this service. The patient expressed understanding and agreed to proceed.   History of Present Illness: Patient with history of EtOH abuse,  Fatty liver, HL, CTS and alcoholic pancreatitis.   On last visit pt c/o dizziness and wgh loss Baseline blood test revealed nl TSH, CBC normal. Negative HIV, +preDM and abn LFTs.  Acute hep panel was negative.  Liver US ordered but pt states she was not called with appt. Pt reports being sober for 2 yrs.  Currently only on Naprosyn and Omeprazole Reports no further wgh loss since last visit.  Reports good appetite.  No further dizziness. Referred to GI on last visit for c-scope for colon cancer screening and because of the weight loss. She has not been called for appt.   Pt has [place] [employer]  Observations/Objective: Results for orders placed or performed in visit on 09/20/20  Acute Viral Hepatitis (HAV, HBV, HCV)  Result Value Ref Range   Hep A IgM Negative Negative   Hepatitis B Surface Ag Negative Negative   Hep B C IgM Negative Negative   HCV Ab <0.1 0.0 - 0.9 s/co ratio  Interpretation:  Result Value Ref Range   HCV Interp 1: Comment      Chemistry      Component Value Date/Time   NA 138 09/18/2020 1408   K 4.1 09/18/2020 1408   CL 101 09/18/2020 1408   CO2 24 09/18/2020 1408   BUN 11 09/18/2020  1408   CREATININE 0.53 (L) 09/18/2020 1408      Component Value Date/Time   CALCIUM 9.8 09/18/2020 1408   ALKPHOS 148 (H) 09/18/2020 1408   AST 88 (H) 09/18/2020 1408   ALT 127 (H) 09/18/2020 1408   BILITOT 0.3 09/18/2020 1408     Lab Results  Component Value Date   WBC 8.7 09/18/2020   HGB 12.5 09/18/2020   HCT 37.0 09/18/2020   MCV 90 09/18/2020   PLT 309 09/18/2020     Assessment and Plan: 1. Abnormal LFTs At this time we will have her return to the lab to have a repeat liver function test done to see if it has normalized.  If it has not then we will move forward with getting the abdominal ultrasound scheduled.  Patient states she is going out of town for 2 weeks tomorrow.  She will come by the lab when she returns. - Hepatic Function Panel; Future  2. Screening for colon cancer Message sent to our referral coordinator to inform her that patient has insurance so that colonoscopy can be scheduled.   Follow Up Instructions: 3 mths   I discussed the assessment and treatment plan with the patient. The patient was provided an opportunity to ask questions and all were answered. The patient agreed with the plan and demonstrated an understanding of the instructions.   The patient was advised to call back or seek an  in-person evaluation if the symptoms worsen or if the condition fails to improve as anticipated.  I  Spent 23 minutes on this telephone encounter  Karle Plumber, MD

## 2020-10-29 ENCOUNTER — Telehealth: Payer: Self-pay

## 2020-10-29 NOTE — Telephone Encounter (Signed)
-----   Message from [name], MD sent at 10/23/2020  5:22 PM EDT ----- Follow-up appointment in 3 months.

## 2020-10-29 NOTE — Telephone Encounter (Signed)
Pt has been scheduled and reminder has been mailed.  

## 2020-11-05 ENCOUNTER — Telehealth: Payer: Self-pay | Admitting: Internal Medicine

## 2020-11-05 NOTE — Telephone Encounter (Signed)
-----   Message from Ena Dawley sent at 11/05/2020  3:25 PM EDT ----- Regarding: RE: GI referral. Good Afternoon Dr Wynetta Emery  Patient aware of her appointment  with Rudolpho Sevin on   10/26 @ 2:20pm  ----- Message ----- From: [name], MD Sent: 10/23/2020   5:22 PM EDT To: Ena Dawley Subject: GI referral.                                   Patient states she has not been called for GI appointment as yet.  She has insurance.

## 2020-11-20 ENCOUNTER — Other Ambulatory Visit: Payer: Self-pay | Admitting: Internal Medicine

## 2020-11-20 ENCOUNTER — Other Ambulatory Visit: Payer: Self-pay

## 2020-11-20 ENCOUNTER — Ambulatory Visit
Admission: RE | Admit: 2020-11-20 | Discharge: 2020-11-20 | Disposition: A | Discharge status: Home / Self Care | Payer: Medicaid Other | Source: Ambulatory Visit | Attending: Internal Medicine | Admitting: Internal Medicine

## 2020-11-20 ENCOUNTER — Ambulatory Visit: Payer: Medicaid Other | Attending: Internal Medicine

## 2020-11-20 DIAGNOSIS — Z1231 Encounter for screening mammogram for malignant neoplasm of breast: Secondary | ICD-10-CM

## 2020-11-20 DIAGNOSIS — N63 Unspecified lump in unspecified breast: Secondary | ICD-10-CM

## 2020-11-20 DIAGNOSIS — R7989 Other specified abnormal findings of blood chemistry: Secondary | ICD-10-CM

## 2020-11-20 DIAGNOSIS — N644 Mastodynia: Secondary | ICD-10-CM

## 2020-11-20 DIAGNOSIS — R945 Abnormal results of liver function studies: Secondary | ICD-10-CM | POA: Diagnosis not present

## 2020-11-21 LAB — HEPATIC FUNCTION PANEL
ALT: 31 IU/L (ref 0–32)
AST: 15 IU/L (ref 0–40)
Albumin: 5.1 g/dL — ABNORMAL HIGH (ref 3.8–4.8)
Alkaline Phosphatase: 70 IU/L (ref 44–121)
Bilirubin Total: 0.3 mg/dL (ref 0.0–1.2)
Bilirubin, Direct: 0.12 mg/dL (ref 0.00–0.40)
Total Protein: 7.7 g/dL (ref 6.0–8.5)

## 2020-11-27 ENCOUNTER — Telehealth: Payer: Self-pay

## 2020-11-27 NOTE — Telephone Encounter (Signed)
Using Spanish interpreter, [place], # [phone]. Pt. Wanted to verify that her last liver tests "were better." Read results to pt. Verbalizes understanding. Pt. States she thought she was supposed to have an abdominal ultrasound, but never heard from anyone. Asking if she still needs this done. Please advise pt.

## 2020-11-28 NOTE — Telephone Encounter (Signed)
Returned pt call and made aware that liver test was normal so she does not need the ultrasound and if she has any questions or concerns to give [place] a call

## 2020-12-17 ENCOUNTER — Ambulatory Visit (INDEPENDENT_AMBULATORY_CARE_PROVIDER_SITE_OTHER): Payer: Medicaid Other | Admitting: Gastroenterology

## 2020-12-17 ENCOUNTER — Encounter: Payer: Self-pay | Admitting: Gastroenterology

## 2020-12-17 VITALS — BP 112/70 | HR 95 | Ht 63.5 in | Wt 203.5 lb

## 2020-12-17 DIAGNOSIS — R1319 Other dysphagia: Secondary | ICD-10-CM | POA: Diagnosis not present

## 2020-12-17 DIAGNOSIS — R1084 Generalized abdominal pain: Secondary | ICD-10-CM

## 2020-12-17 DIAGNOSIS — R7989 Other specified abnormal findings of blood chemistry: Secondary | ICD-10-CM

## 2020-12-17 DIAGNOSIS — R634 Abnormal weight loss: Secondary | ICD-10-CM

## 2020-12-17 DIAGNOSIS — K5909 Other constipation: Secondary | ICD-10-CM | POA: Diagnosis not present

## 2020-12-17 NOTE — Patient Instructions (Addendum)
If you are age 48 or older, your body mass index should be between 23-30. Your Body mass index is 35.48 kg/m. If this is out of the aforementioned range listed, please consider follow up with your Primary Care Provider.  If you are age 40 or younger, your body mass index should be between 19-25. Your Body mass index is 35.48 kg/m. If this is out of the aformentioned range listed, please consider follow up with your Primary Care Provider.   ________________________________________________________  The [facility] providers would like to encourage you to use [facility] to communicate with providers for non-urgent requests or questions.  Due to long hold times on the telephone, sending your provider a message by [facility] may be a faster and more efficient way to get a response.  Please allow 48 business hours for a response.  Please remember that this is for non-urgent requests.  _______________________________________________________  Dennis Bast have been scheduled for an endoscopy/colonoscopy. Please follow written instructions given to you at your visit today. If you use inhalers (even only as needed), please bring them with you on the day of your procedure.  You have been scheduled for an abdominal ultrasound at [facility] (1st floor of hospital) on 12-25-2020 at 9am. Please arrive 15 minutes prior to your appointment for registration. Make certain not to have anything to eat or drink 6 hours prior to your appointment. Should you need to reschedule your appointment, please contact radiology at [phone]. This test typically takes about 30 minutes to perform.  Please take Miralax  1 capful for 3 days before the colonoscopy!  It was a pleasure to see you today!  Thank you for trusting me with your gastrointestinal care!

## 2020-12-17 NOTE — Progress Notes (Signed)
[facility] Progress Note:  History: Kelsey Swanson 12/17/2020  Referring provider: [name], MD  Reason for consult/chief complaint: Weight Loss (Pt has gained weight now), Abdominal Pain (Right side pain upper and lower and middle of stomach that feels sharp ), and Bloated (Feels bloated at lot of the time )   Subjective  HPI: Kelsey Swanson was seen once in the office March 2019 after hospitalization for acute alcohol-related pancreatitis.  She felt well at that point, had stopped drinking and no further testing or treatment was planned.  She was seen with the aid of a Spanish interpreter today, and referred back for chronic digestive symptoms and weight loss.  Over the summer she had lost an unspecified amount of weight, and was complaining of some lower chest/epigastric discomfort.  Curiously, that would only happen when she has liquids and not solid food.  Sometimes food or liquid feels briefly hung up in the neck.  The upper abdominal pain continues now but to a lesser degree.  She feels bloated and gassy much of the time, and also has had some intermittent sharp right upper quadrant pain as well that is sometimes triggered by food.  She has chronic constipation and has noticed some occasional blood on the paper when straining for bowel movements.  She started taking MiraLAX a capful a day but found that it made the stool too loose so she stopped. LFTs were elevated over the summer and then normalized and she does not know why.  She has not had alcohol since I last saw her several years ago.   ROS:  Review of Systems  Constitutional:  Positive for fatigue. Negative for appetite change and unexpected weight change.  HENT:  Negative for mouth sores and voice change.   Eyes:  Negative for pain and redness.  Respiratory:  Negative for cough and shortness of breath.   Cardiovascular:  Negative for chest pain and palpitations.  Genitourinary:  Negative for  dysuria and hematuria.  Musculoskeletal:  Negative for arthralgias and myalgias.  Skin:  Negative for pallor and rash.  Neurological:  Negative for weakness and headaches.  Hematological:  Negative for adenopathy.    Past Medical History: Past Medical History:  Diagnosis Date   Abnormal Pap smear 1996   Fatty liver    Headache    Pancreatitis    Pancreatitis, chronic (HCC)    Thrombocytopenia ([place])    Vaginal Pap smear, abnormal      Past Surgical History: Past Surgical History:  Procedure Laterality Date   BREAST EXCISIONAL BIOPSY Right    BREAST SURGERY     right breast   ECTOPIC PREGNANCY SURGERY  2012   TUBAL LIGATION Bilateral 03/14/2019   Procedure: POST PARTUM TUBAL LIGATION;  Surgeon: Caren Macadam, MD;  Location: [facility];  Service: Gynecology;  Laterality: Bilateral;     Family History: Family History  Problem Relation Age of Onset   Diabetes Father    Diabetes Mother    Diabetes Sister    Diabetes Maternal Aunt    Ovarian cancer Maternal Aunt    Diabetes Maternal Grandmother    Diabetes Maternal Uncle    Breast cancer Neg Hx     Social History: Social History   Socioeconomic History   Marital status: Married    Spouse name: Not on file   Number of children: Not on file   Years of education: Not on file   Highest education level: 6th grade  Occupational History  Occupation: unemployed  Tobacco Use   Smoking status: Former    Packs/day: 0.30    Years: 10.00    Pack years: 3.00    Types: Cigarettes    Quit date: 06/07/2015    Years since quitting: 5.5   Smokeless tobacco: Never  Vaping Use   Vaping Use: Never used  Substance and Sexual Activity   Alcohol use: Not Currently    Comment:  May 2020   Drug use: No   Sexual activity: Yes    Partners: Male    Birth control/protection: Surgical  Other Topics Concern   Not on file  Social History Narrative   Not on file   Social Determinants of Health   Financial Resource  Strain: Not on file  Food Insecurity: Not on file  Transportation Needs: Not on file  Physical Activity: Not on file  Stress: Not on file  Social Connections: Not on file    Allergies: No Known Allergies  Outpatient Meds: Current Outpatient Medications  Medication Sig Dispense Refill   Ascorbic Acid (VITAMIN C) 1000 MG tablet Take 1,000 mg by mouth daily.     naproxen (NAPROSYN) 500 MG tablet Take 1 tablet (500 mg total) by mouth 2 (two) times daily with a meal. Prn pain 60 tablet 1   omeprazole (PRILOSEC) 20 MG capsule Take 1 capsule (20 mg total) by mouth daily. 30 capsule 3   No current facility-administered medications for this visit.      ___________________________________________________________________ Objective   Exam:  BP 112/70   Pulse 95   Ht 5' 3.5" (1.613 m)   Wt 203 lb 8 oz (92.3 kg)   LMP  (LMP Unknown) Comment: gave birth 03/13/2019  BMI 35.48 kg/m  Wt Readings from Last 3 Encounters:  12/17/20 203 lb 8 oz (92.3 kg)  09/18/20 188 lb 6.4 oz (85.5 kg)  05/18/19 250 lb (113.4 kg)   Spanish interpreter present for entire visit General: Well-appearing Eyes: sclera anicteric, no redness ENT: oral mucosa moist without lesions, no cervical or supraclavicular lymphadenopathy CV: RRR without murmur, S1/S2, no JVD, no peripheral edema Resp: clear to auscultation bilaterally, normal RR and effort noted GI: soft, mild epigastric tenderness, with active bowel sounds. No guarding or palpable organomegaly noted, limited by body habitus Skin; warm and dry, no rash or jaundice noted Neuro: awake, alert and oriented x 3. Normal gross motor function and fluent speech  Labs:  CMP Latest Ref Rng & Units 11/20/2020 09/18/2020 05/18/2019  Glucose 65 - 99 mg/dL - 76 111(H)  BUN 6 - 24 mg/dL - 11 21(H)  Creatinine 0.57 - 1.00 mg/dL - 0.53(L) 0.46  Sodium 134 - 144 mmol/L - 138 136  Potassium 3.5 - 5.2 mmol/L - 4.1 3.6  Chloride 96 - 106 mmol/L - 101 102  CO2 20 - 29  mmol/L - 24 23  Calcium 8.7 - 10.2 mg/dL - 9.8 9.0  Total Protein 6.0 - 8.5 g/dL 7.7 7.7 -  Total Bilirubin 0.0 - 1.2 mg/dL 0.3 0.3 -  Alkaline Phos 44 - 121 IU/L 70 148(H) -  AST 0 - 40 IU/L 15 88(H) -  ALT 0 - 32 IU/L 31 127(H) -   CBC Latest Ref Rng & Units 09/18/2020 05/18/2019 03/13/2019  WBC 3.4 - 10.8 x10E3/uL 8.7 6.0 9.6  Hemoglobin 11.1 - 15.9 g/dL 12.5 12.3 12.8  Hematocrit 34.0 - 46.6 % 37.0 38.4 40.1  Platelets 150 - 450 x10E3/uL 309 209 190   Acute hepatitis panel negative on  10/01/2020  Radiologic Studies:  No abdominal imaging.  Teagen says she was going to have an ultrasound, then PCP canceled that because LFTs returned normal last month.  Assessment: Encounter Diagnoses  Name Primary?   Abnormal loss of weight Yes   LFTs abnormal    Generalized abdominal pain    Chronic constipation    Esophageal dysphagia     Abdominal pain somewhat difficult to characterize.  Some dysphagia, but also curious that the pain occurs with liquids but not solid food.  Right upper quadrant pain was apparently more sharp and severe months ago, perhaps coinciding with when the LFTs were elevated, but difficult to tell for certain based on the records. Chronic constipation with some probably benign anorectal bleeding related to straining. She had lost some weight months ago but then says it stabilized and she has put some weight back on.  Plan: Abdominal ultrasound  Upper endoscopy and colonoscopy.  Procedure as described in detail along with risks and benefits and she was agreeable.  The benefits and risks of the planned procedure were described in detail with the patient or (when appropriate) their health care proxy.  Risks were outlined as including, but not limited to, bleeding, infection, perforation, adverse medication reaction leading to cardiac or pulmonary decompensation, pancreatitis (if ERCP).  The limitation of incomplete mucosal visualization was also discussed.  No guarantees  or warranties were given.  In the meantime, I recommended she resume MiraLAX but at half dose so as not to have loose stool.  Thank you for the courtesy of this consult.  Please call me with any questions or concerns.  Nelida Meuse III  CC: Referring provider noted above

## 2020-12-25 ENCOUNTER — Ambulatory Visit (HOSPITAL_COMMUNITY)
Admission: RE | Admit: 2020-12-25 | Discharge: 2020-12-25 | Disposition: A | Discharge status: Home / Self Care | Payer: Medicaid Other | Source: Ambulatory Visit | Attending: Gastroenterology | Admitting: Gastroenterology

## 2020-12-25 ENCOUNTER — Other Ambulatory Visit: Payer: Self-pay

## 2020-12-25 DIAGNOSIS — R634 Abnormal weight loss: Secondary | ICD-10-CM | POA: Diagnosis not present

## 2020-12-25 DIAGNOSIS — R7989 Other specified abnormal findings of blood chemistry: Secondary | ICD-10-CM | POA: Insufficient documentation

## 2020-12-25 DIAGNOSIS — K5909 Other constipation: Secondary | ICD-10-CM | POA: Diagnosis not present

## 2020-12-25 DIAGNOSIS — K8020 Calculus of gallbladder without cholecystitis without obstruction: Secondary | ICD-10-CM | POA: Diagnosis not present

## 2020-12-25 DIAGNOSIS — R1084 Generalized abdominal pain: Secondary | ICD-10-CM | POA: Insufficient documentation

## 2020-12-25 DIAGNOSIS — R1319 Other dysphagia: Secondary | ICD-10-CM | POA: Insufficient documentation

## 2020-12-31 ENCOUNTER — Ambulatory Visit
Admission: RE | Admit: 2020-12-31 | Discharge: 2020-12-31 | Disposition: A | Discharge status: Home / Self Care | Payer: Medicaid Other | Source: Ambulatory Visit | Attending: Internal Medicine | Admitting: Internal Medicine

## 2020-12-31 ENCOUNTER — Other Ambulatory Visit: Payer: Self-pay

## 2020-12-31 DIAGNOSIS — N63 Unspecified lump in unspecified breast: Secondary | ICD-10-CM

## 2020-12-31 DIAGNOSIS — N644 Mastodynia: Secondary | ICD-10-CM | POA: Diagnosis not present

## 2020-12-31 DIAGNOSIS — R922 Inconclusive mammogram: Secondary | ICD-10-CM | POA: Diagnosis not present

## 2021-01-18 ENCOUNTER — Ambulatory Visit (HOSPITAL_COMMUNITY)
Admission: EM | Admit: 2021-01-18 | Discharge: 2021-01-18 | Disposition: A | Discharge status: Home / Self Care | Payer: Medicaid Other | Attending: Urgent Care | Admitting: Urgent Care

## 2021-01-18 ENCOUNTER — Other Ambulatory Visit: Payer: Self-pay

## 2021-01-18 ENCOUNTER — Ambulatory Visit (INDEPENDENT_AMBULATORY_CARE_PROVIDER_SITE_OTHER): Payer: Medicaid Other

## 2021-01-18 ENCOUNTER — Encounter (HOSPITAL_COMMUNITY): Payer: Self-pay

## 2021-01-18 DIAGNOSIS — M62830 Muscle spasm of back: Secondary | ICD-10-CM

## 2021-01-18 DIAGNOSIS — M549 Dorsalgia, unspecified: Secondary | ICD-10-CM | POA: Diagnosis not present

## 2021-01-18 DIAGNOSIS — R052 Subacute cough: Secondary | ICD-10-CM | POA: Diagnosis not present

## 2021-01-18 DIAGNOSIS — R5383 Other fatigue: Secondary | ICD-10-CM

## 2021-01-18 DIAGNOSIS — M546 Pain in thoracic spine: Secondary | ICD-10-CM

## 2021-01-18 DIAGNOSIS — R059 Cough, unspecified: Secondary | ICD-10-CM

## 2021-01-18 MED ORDER — TIZANIDINE HCL 4 MG PO TABS: 4.0000 mg | ORAL_TABLET | Freq: Every day | ORAL | 0 refills | Status: DC

## 2021-01-18 MED ORDER — NAPROXEN 500 MG PO TABS: 500.0000 mg | ORAL_TABLET | Freq: Two times a day (BID) | ORAL | 0 refills | Status: DC

## 2021-01-18 NOTE — ED Provider Notes (Signed)
[place]   MRN: [id number] DOB: October 24, 1970  Subjective:   Kelsey Swanson is a 50 y.o. female presenting for 2 month history of persistent thoracic back pain on either side, intermittent coughing, now having more fatigue, intermittent shortness of breath.  No fever, wheezing.  No history of respiratory disorders.  She is concerned about her lungs.  She is not a smoker.  No current facility-administered medications for this encounter.  Current Outpatient Medications:    Ascorbic Acid (VITAMIN C) 1000 MG tablet, Take 1,000 mg by mouth daily., Disp: , Rfl:    naproxen (NAPROSYN) 500 MG tablet, Take 1 tablet (500 mg total) by mouth 2 (two) times daily with a meal. Prn pain, Disp: 60 tablet, Rfl: 1   omeprazole (PRILOSEC) 20 MG capsule, Take 1 capsule (20 mg total) by mouth daily., Disp: 30 capsule, Rfl: 3   No Known Allergies  Past Medical History:  Diagnosis Date   Abnormal Pap smear 1996   Fatty liver    Headache    Pancreatitis    Pancreatitis, chronic (HCC)    Thrombocytopenia (HCC)    Vaginal Pap smear, abnormal      Past Surgical History:  Procedure Laterality Date   BREAST EXCISIONAL BIOPSY Right    BREAST SURGERY     right breast   ECTOPIC PREGNANCY SURGERY  2012   TUBAL LIGATION Bilateral 03/14/2019   Procedure: POST PARTUM TUBAL LIGATION;  Surgeon: Caren Macadam, MD;  Location: [facility];  Service: Gynecology;  Laterality: Bilateral;    Family History  Problem Relation Age of Onset   Diabetes Father    Diabetes Mother    Diabetes Sister    Diabetes Maternal Aunt    Ovarian cancer Maternal Aunt    Diabetes Maternal Grandmother    Diabetes Maternal Uncle    Breast cancer Neg Hx     Social History   Tobacco Use   Smoking status: Former    Packs/day: 0.30    Years: 10.00    Pack years: 3.00    Types: Cigarettes    Quit date: 06/07/2015    Years since quitting: 5.6   Smokeless tobacco: Never  Vaping Use   Vaping Use:  Never used  Substance Use Topics   Alcohol use: Not Currently    Comment:  May 2020   Drug use: No    ROS   Objective:   Vitals: BP 114/76 (BP Location: Right Arm)   Pulse 84   Temp 98.6 F (37 C) (Oral)   Resp 16   LMP 12/13/2020 Comment: gave birth 03/13/2019  SpO2 100%   Physical Exam Constitutional:      General: She is not in acute distress.    Appearance: Normal appearance. She is well-developed. She is not ill-appearing, toxic-appearing or diaphoretic.  HENT:     Head: Normocephalic and atraumatic.     Nose: Nose normal.     Mouth/Throat:     Mouth: Mucous membranes are moist.  Eyes:     Extraocular Movements: Extraocular movements intact.     Pupils: Pupils are equal, round, and reactive to light.  Cardiovascular:     Rate and Rhythm: Normal rate and regular rhythm.     Pulses: Normal pulses.     Heart sounds: Normal heart sounds. No murmur heard.   No friction rub. No gallop.  Pulmonary:     Effort: Pulmonary effort is normal. No respiratory distress.     Breath sounds:  Normal breath sounds. No stridor. No wheezing, rhonchi or rales.  Musculoskeletal:     Thoracic back: Spasms and tenderness (over paraspinal muscles of thoracic region) present. No swelling, edema, deformity, signs of trauma, lacerations or bony tenderness. Normal range of motion. No scoliosis.  Skin:    General: Skin is warm and dry.     Findings: No rash.  Neurological:     Mental Status: She is alert and oriented to person, place, and time.  Psychiatric:        Mood and Affect: Mood normal.        Behavior: Behavior normal.        Thought Content: Thought content normal.    DG Chest 2 View  Result Date: 01/18/2021 CLINICAL DATA:  Upper back pain and cough. EXAM: CHEST - 2 VIEW COMPARISON:  Chest x-ray 06/15/2016 FINDINGS: The heart size and mediastinal contours are within normal limits. Both lungs are clear. The visualized skeletal structures are unremarkable. IMPRESSION: No active  cardiopulmonary disease. Electronically Signed   By: Ronney Asters M.D.   On: 01/18/2021 17:43     Assessment and Plan :   PDMP not reviewed this encounter.  1. Acute bilateral thoracic back pain   2. Subacute cough   3. Back spasm   4. Other fatigue    Will manage for musculoskeletal type pain with naproxen, tizanidine.  Low suspicion for pulmonary embolism.  She has minimal risk factors for this, low Wells criteria. Counseled patient on potential for adverse effects with medications prescribed/recommended today, ER and return-to-clinic precautions discussed, patient verbalized understanding.    Jaynee Eagles, PA-C 01/18/21 1800

## 2021-01-18 NOTE — ED Triage Notes (Signed)
Patient presents to the office for back pain x 2 months.

## 2021-01-19 ENCOUNTER — Telehealth: Payer: Self-pay | Admitting: Gastroenterology

## 2021-01-19 MED ORDER — PLENVU 140 G PO SOLR: 140.0000 g | ORAL | 0 refills | Status: DC

## 2021-01-19 NOTE — Telephone Encounter (Signed)
Rx has been sent as requested. 

## 2021-01-19 NOTE — Telephone Encounter (Signed)
Patient called requesting we send her Plenvu prep medication to her pharmacy went to pick it up but they do not have it.

## 2021-01-21 ENCOUNTER — Other Ambulatory Visit: Payer: Self-pay

## 2021-01-21 ENCOUNTER — Encounter: Payer: Self-pay | Admitting: Gastroenterology

## 2021-01-21 ENCOUNTER — Ambulatory Visit (AMBULATORY_SURGERY_CENTER): Payer: Medicaid Other | Admitting: Gastroenterology

## 2021-01-21 VITALS — BP 95/48 | HR 79 | Temp 98.0°F | Resp 16 | Ht 63.0 in | Wt 203.0 lb

## 2021-01-21 DIAGNOSIS — K5909 Other constipation: Secondary | ICD-10-CM

## 2021-01-21 DIAGNOSIS — R634 Abnormal weight loss: Secondary | ICD-10-CM

## 2021-01-21 DIAGNOSIS — D122 Benign neoplasm of ascending colon: Secondary | ICD-10-CM | POA: Diagnosis not present

## 2021-01-21 DIAGNOSIS — D123 Benign neoplasm of transverse colon: Secondary | ICD-10-CM | POA: Diagnosis not present

## 2021-01-21 DIAGNOSIS — R1010 Upper abdominal pain, unspecified: Secondary | ICD-10-CM | POA: Diagnosis not present

## 2021-01-21 DIAGNOSIS — K5730 Diverticulosis of large intestine without perforation or abscess without bleeding: Secondary | ICD-10-CM

## 2021-01-21 DIAGNOSIS — R1319 Other dysphagia: Secondary | ICD-10-CM | POA: Diagnosis not present

## 2021-01-21 DIAGNOSIS — K2960 Other gastritis without bleeding: Secondary | ICD-10-CM

## 2021-01-21 MED ORDER — SODIUM CHLORIDE 0.9 % IV SOLN: 500.0000 mL | Freq: Once | INTRAVENOUS | Status: DC

## 2021-01-21 NOTE — Progress Notes (Signed)
History and Physical:  This patient presents for endoscopic testing for: Encounter Diagnoses  Name Primary?   Abnormal loss of weight Yes   Esophageal dysphagia     Clinical details in 12/17/20 office note - no changes since then  ROS: Patient denies chest pain or cough   Past Medical History: Past Medical History:  Diagnosis Date   Abnormal Pap smear 1996   Fatty liver    Headache    Pancreatitis    Pancreatitis, chronic (HCC)    Thrombocytopenia (HCC)    Vaginal Pap smear, abnormal      Past Surgical History: Past Surgical History:  Procedure Laterality Date   BREAST EXCISIONAL BIOPSY Right    BREAST SURGERY     right breast   COLONOSCOPY     ECTOPIC PREGNANCY SURGERY  2012   TUBAL LIGATION Bilateral 03/14/2019   Procedure: POST PARTUM TUBAL LIGATION;  Surgeon: Caren Macadam, MD;  Location: [facility];  Service: Gynecology;  Laterality: Bilateral;    Allergies: No Known Allergies  Outpatient Meds: Current Outpatient Medications  Medication Sig Dispense Refill   Ascorbic Acid (VITAMIN C) 1000 MG tablet Take 1,000 mg by mouth daily.     naproxen (NAPROSYN) 500 MG tablet Take 1 tablet (500 mg total) by mouth 2 (two) times daily with a meal. 30 tablet 0   omeprazole (PRILOSEC) 20 MG capsule Take 1 capsule (20 mg total) by mouth daily. 30 capsule 3   tiZANidine (ZANAFLEX) 4 MG tablet Take 1 tablet (4 mg total) by mouth at bedtime. 30 tablet 0   Current Facility-Administered Medications  Medication Dose Route Frequency Provider Last Rate Last Admin   0.9 %  sodium chloride infusion  500 mL Intravenous Once Danis, Estill Cotta III, MD          ___________________________________________________________________ Objective   Exam:  BP 127/68   Pulse (!) 103   Temp 98 F (36.7 C) (Temporal)   Ht 5\' 3"  (1.6 m)   Wt 203 lb (92.1 kg)   LMP  (LMP Unknown) Comment: gave birth 03/13/2019  SpO2 97%   BMI 35.96 kg/m   CV: RRR without murmur, S1/S2 Resp:  clear to auscultation bilaterally, normal RR and effort noted GI: soft, no tenderness, with active bowel sounds.   Assessment: Encounter Diagnoses  Name Primary?   Abnormal loss of weight Yes   Esophageal dysphagia      Plan: Colonoscopy EGD   The patient is appropriate for an endoscopic procedure in the ambulatory setting.   - Wilfrid Lund, MD

## 2021-01-21 NOTE — Progress Notes (Signed)
VS completed by DT.   Patient denied the interpreter provided for her and would rather her husband be her interpreter.  Medical and surgical history reviewed with patient and updated.

## 2021-01-21 NOTE — Op Note (Signed)
[facility] Patient Name: Kelsey Swanson Procedure Date: 01/21/2021 2:11 PM MRN: [id number] Endoscopist: Mallie Mussel L. Loletha Carrow , MD Age: 50 Referring MD:  Date of Birth: 1970/08/22 Gender: Female Account #: [id number] Procedure:                Upper GI endoscopy Indications:              Upper abdominal pain (intermittent), Esophageal                            dysphagia (liquids) Medicines:                Monitored Anesthesia Care Procedure:                Pre-Anesthesia Assessment:                           - Prior to the procedure, a History and Physical                            was performed, and patient medications and                            allergies were reviewed. The patient's tolerance of                            previous anesthesia was also reviewed. The risks                            and benefits of the procedure and the sedation                            options and risks were discussed with the patient.                            All questions were answered, and informed consent                            was obtained. Prior Anticoagulants: The patient has                            taken no previous anticoagulant or antiplatelet                            agents. ASA Grade Assessment: II - A patient with                            mild systemic disease. After reviewing the risks                            and benefits, the patient was deemed in                            satisfactory condition to undergo the procedure.  After obtaining informed consent, the endoscope was                            passed under direct vision. Throughout the                            procedure, the patient's blood pressure, pulse, and                            oxygen saturations were monitored continuously. The                            GIF [id number] #[id number] was introduced through the                            mouth, and advanced to the second part  of duodenum.                            The upper GI endoscopy was accomplished without                            difficulty. The patient tolerated the procedure                            well. Scope In: Scope Out: Findings:                 The examined esophagus was normal. The scope was                            withdrawn after completion of remainder of exam.                            Dilation was performed with a Maloney dilator with                            no resistance at 52 Fr.                           The entire examined stomach was normal. Biopsies                            were taken with a cold forceps for histology to r/o                            H pylori (antrum and body).                           The cardia and gastric fundus were normal on                            retroflexion.                           The examined duodenum was normal. Complications:  No immediate complications. Estimated Blood Loss:     Estimated blood loss was minimal. Impression:               - Normal esophagus. Dilated.                           - Normal stomach. Biopsied.                           - Normal examined duodenum. Recommendation:           - Patient has a contact number available for                            emergencies. The signs and symptoms of potential                            delayed complications were discussed with the                            patient. Return to normal activities tomorrow.                            Written discharge instructions were provided to the                            patient.                           - Resume previous diet.                           - Continue present medications.                           - Await pathology results.                           - See the other procedure note for documentation of                            additional recommendations.                           - Return to my office at  appointment to be                            scheduled. Worth Kober L. Loletha Carrow, MD 01/21/2021 2:56:50 PM This report has been signed electronically.

## 2021-01-21 NOTE — Patient Instructions (Addendum)
2 polyps removed and biopsies taken from stomach- await pathology  Follow dilation diet today!!!  See handout!!!!  Continue your normal medications  Please read over handouts about polyps and diverticulosis  YOU HAD AN ENDOSCOPIC PROCEDURE TODAY AT [facility]:   Refer to the procedure report that was given to you for any specific questions about what was found during the examination.  If the procedure report does not answer your questions, please call your gastroenterologist to clarify.  If you requested that your care partner not be given the details of your procedure findings, then the procedure report has been included in a sealed envelope for you to review at your convenience later.  YOU SHOULD EXPECT: Some feelings of bloating in the abdomen. Passage of more gas than usual.  Walking can help get rid of the air that was put into your GI tract during the procedure and reduce the bloating. If you had a lower endoscopy (such as a colonoscopy or flexible sigmoidoscopy) you may notice spotting of blood in your stool or on the toilet paper. If you underwent a bowel prep for your procedure, you may not have a normal bowel movement for a few days.  Please Note:  You might notice some irritation and congestion in your nose or some drainage.  This is from the oxygen used during your procedure.  There is no need for concern and it should clear up in a day or so.  SYMPTOMS TO REPORT IMMEDIATELY:  Following lower endoscopy (colonoscopy or flexible sigmoidoscopy):  Excessive amounts of blood in the stool  Significant tenderness or worsening of abdominal pains  Swelling of the abdomen that is new, acute  Fever of 100F or higher  Following upper endoscopy (EGD)  Vomiting of blood or coffee ground material  New chest pain or pain under the shoulder blades  Painful or persistently difficult swallowing  New shortness of breath  Fever of 100F or higher  Black, tarry-looking  stools  For urgent or emergent issues, a gastroenterologist can be reached at any hour by calling [phone]. Do not use MyChart messaging for urgent concerns.    DIET:  FOLLOW DILATION DIET TODAY!!! Drink plenty of fluids but you should avoid alcoholic beverages for 24 hours.  ACTIVITY:  You should plan to take it easy for the rest of today and you should NOT DRIVE or use heavy machinery until tomorrow (because of the sedation medicines used during the test).    FOLLOW UP: Our staff will call the number listed on your records 48-72 hours following your procedure to check on you and address any questions or concerns that you may have regarding the information given to you following your procedure. If we do not reach you, we will leave a message.  We will attempt to reach you two times.  During this call, we will ask if you have developed any symptoms of COVID 19. If you develop any symptoms (ie: fever, flu-like symptoms, shortness of breath, cough etc.) before then, please call [phone].  If you test positive for Covid 19 in the 2 weeks post procedure, please call and report this information to [place].    If any biopsies were taken you will be contacted by phone or by letter within the next 1-3 weeks.  Please call us at [phone] if you have not heard about the biopsies in 3 weeks.    SIGNATURES/CONFIDENTIALITY: You and/or your care partner have signed paperwork which will be entered into  your electronic medical record.  These signatures attest to the fact that that the information above on your After Visit Summary has been reviewed and is understood.  Full responsibility of the confidentiality of this discharge information lies with you and/or your care-partner.   USTED TUVO UN PROCEDIMIENTO ENDOSCPICO HOY EN EL [facility]:   Lea el informe del procedimiento que se le entreg para cualquier pregunta especfica sobre lo que se Primary school teacher.  Si el informe  del examen no responde a sus preguntas, por favor llame a su gastroenterlogo para aclararlo.  Si usted solicit que no se le den [employer] de lo que se Estate manager/land agent en su procedimiento al [employer] va a cuidar, entonces el informe del procedimiento se ha incluido en un sobre sellado para que usted lo revise despus cuando le sea ms conveniente.   LO QUE PUEDE ESPERAR: Algunas sensaciones de hinchazn en el abdomen.  Puede tener ms gases de lo normal.  El caminar puede ayudarle a eliminar el aire que se le puso en el tracto gastrointestinal durante el procedimiento y reducir la hinchazn.  Si le hicieron una endoscopia inferior (como una colonoscopia o una sigmoidoscopia flexible), podra notar manchas de sangre en las heces fecales o en el papel higinico.  Si se someti a una preparacin intestinal para su procedimiento, es posible que no tenga una evacuacin intestinal normal durante [employer].   Tenga en cuenta:  Es posible que note un poco de irritacin y congestin en la nariz o algn drenaje.  Esto es debido al oxgeno [employer] durante su procedimiento.  No hay que preocuparse y esto debe desaparecer ms o Scientist, research (medical).   SNTOMAS PARA REPORTAR INMEDIATAMENTE:  Despus de una endoscopia inferior (colonoscopia o sigmoidoscopia flexible):  Cantidades excesivas de sangre en las heces fecales  Sensibilidad significativa o empeoramiento de los dolores abdominales   Hinchazn aguda del abdomen que antes no tena   Fiebre de 100F o ms   Despus de la endoscopia superior (EGD)  Vmitos de Biochemist, clinical o material como caf molido   Dolor en el pecho o dolor debajo de los omplatos que antes no tena   Dolor o dificultad persistente para tragar  Falta de aire que antes no tena   Fiebre de 100F o ms  Heces fecales negras y pegajosas   Para asuntos urgentes o de Freight forwarder, puede comunicarse con un gastroenterlogo a cualquier hora llamando al [phone].  DIETA:  FOLLOW DILATION  DIET!!  Tome muchos lquidos, Teacher, adult education las bebidas alcohlicas durante 24 horas.    ACTIVIDAD:  Debe planear tomarse las cosas con calma por el resto del da y no debe CONDUCIR ni usar maquinaria pesada Programmer, applications (debido a los medicamentos de sedacin utilizados durante el examen).     SEGUIMIENTO: Nuestro personal llamar al nmero que aparece en su historial al siguiente da hbil de su procedimiento para ver cmo se siente y para responder cualquier pregunta o inquietud que pueda tener con respecto a la informacin que se le dio despus del procedimiento. Si no podemos contactarle, le dejaremos un mensaje.  Sin embargo, si se siente bien y no tiene Paediatric nurse, no es necesario que nos devuelva la llamada.  Asumiremos que ha regresado a sus actividades diarias normales sin incidentes. Si se le tomaron algunas biopsias, le contactaremos por telfono o por carta en las prximas 3 semanas.  Si no ha sabido [employer] biopsias en el transcurso de  3 semanas, por favor llmenos al [phone].   FIRMAS/CONFIDENCIALIDAD: Usted y/o el acompaante que le cuide han firmado documentos que se ingresarn en su historial mdico electrnico.  Estas firmas atestiguan el hecho de que la informacin anterior

## 2021-01-21 NOTE — Progress Notes (Signed)
Called to room to assist during endoscopic procedure.  Patient ID and intended procedure confirmed with present staff. Received instructions for my participation in the procedure from the performing physician.  

## 2021-01-21 NOTE — Progress Notes (Signed)
Husband has been with pt in the RR to help interpret

## 2021-01-21 NOTE — Progress Notes (Signed)
To pacu, VSS. Report to [email or web address] 

## 2021-01-21 NOTE — Op Note (Signed)
[facility] Patient Name: Kelsey Swanson Procedure Date: 01/21/2021 2:12 PM MRN: [id number] Endoscopist: Mallie Mussel L. Loletha Carrow , MD Age: 50 Referring MD:  Date of Birth: [birth year] Gender: Female Account #: [id number] Procedure:                Colonoscopy Indications:              Rectal bleeding, Constipation Medicines:                Monitored Anesthesia Care Procedure:                Pre-Anesthesia Assessment:                           - Prior to the procedure, a History and Physical                            was performed, and patient medications and                            allergies were reviewed. The patient's tolerance of                            previous anesthesia was also reviewed. The risks                            and benefits of the procedure and the sedation                            options and risks were discussed with the patient.                            All questions were answered, and informed consent                            was obtained. Prior Anticoagulants: The patient has                            taken no previous anticoagulant or antiplatelet                            agents. ASA Grade Assessment: II - A patient with                            mild systemic disease. After reviewing the risks                            and benefits, the patient was deemed in                            satisfactory condition to undergo the procedure.                           After obtaining informed consent, the colonoscope  was passed under direct vision. Throughout the                            procedure, the patient's blood pressure, pulse, and                            oxygen saturations were monitored continuously. The                            CF [id number] #[id number] was introduced through the anus                            and advanced to the the terminal ileum, with                            identification of the appendiceal  orifice and IC                            valve. The colonoscopy was performed without                            difficulty. The patient tolerated the procedure                            well. The quality of the bowel preparation was                            excellent. The terminal ileum, ileocecal valve,                            appendiceal orifice, and rectum were photographed. Scope In: 2:19:33 PM Scope Out: 2:33:00 PM Scope Withdrawal Time: 0 hours 10 minutes 51 seconds  Total Procedure Duration: 0 hours 13 minutes 27 seconds  Findings:                 The perianal and digital rectal examinations were                            normal.                           The terminal ileum appeared normal.                           Diverticula were found in the right colon.                           Two sessile polyps were found in the transverse                            colon and ascending colon. The polyps were 2 to 5                            mm in size. These polyps were removed with a cold  snare. Resection and retrieval were complete.                           The exam was otherwise without abnormality on                            direct and retroflexion views. Complications:            No immediate complications. Estimated Blood Loss:     Estimated blood loss was minimal. Impression:               - The examined portion of the ileum was normal.                           - Diverticulosis in the right colon.                           - Two 2 to 5 mm polyps in the transverse colon and                            in the ascending colon, removed with a cold snare.                            Resected and retrieved.                           - The examination was otherwise normal on direct                            and retroflexion views.                           Benign ano-rectal bleeding from constipation. Recommendation:           - Patient has a  contact number available for                            emergencies. The signs and symptoms of potential                            delayed complications were discussed with the                            patient. Return to normal activities tomorrow.                            Written discharge instructions were provided to the                            patient.                           - Resume previous diet.                           - Continue present medications.                           -  Await pathology results.                           - Repeat colonoscopy is recommended for                            surveillance. The colonoscopy date will be                            determined after pathology results from today's                            exam become available for review. Loreley Schwall L. Loletha Carrow, MD 01/21/2021 2:53:41 PM This report has been signed electronically.

## 2021-01-22 ENCOUNTER — Telehealth: Payer: Self-pay

## 2021-01-22 NOTE — Telephone Encounter (Signed)
Per 01/21/21 procedure report - Return to my office at appt to be scheduled  Called and spoke with patient. She has been scheduled for a follow up appt with Dr. Loletha Carrow on Monday, 03/02/21 at 2:20 pm. Patient verbalized understanding and had no concerns at the end of the call.

## 2021-01-23 ENCOUNTER — Telehealth: Payer: Self-pay | Admitting: *Deleted

## 2021-01-23 NOTE — Telephone Encounter (Signed)
  Follow up Call-  Call back number 01/21/2021  Post procedure Call Back phone  # [phone]  Permission to leave phone message Yes  Some recent data might be hidden     Patient questions:  Do you have a fever, pain , or abdominal swelling? No. Pain Score  0 *  Have you tolerated food without any problems? Yes.    Have you been able to return to your normal activities? Yes.    Do you have any questions about your discharge instructions: Diet   No. Medications  No. Follow up visit  No.  Do you have questions or concerns about your Care? No.  Actions: * If pain score is 4 or above: No action needed, pain <4.  Have you developed a fever since your procedure? no  2.   Have you had an respiratory symptoms (SOB or cough) since your procedure? no  3.   Have you tested positive for COVID 19 since your procedure no  4.   Have you had any family members/close contacts diagnosed with the COVID 19 since your procedure?  no   If yes to any of these questions please route to Joylene John, RN and Joella Prince, RN

## 2021-01-29 ENCOUNTER — Encounter: Payer: Self-pay | Admitting: Gastroenterology

## 2021-02-02 ENCOUNTER — Other Ambulatory Visit: Payer: Self-pay

## 2021-02-02 ENCOUNTER — Encounter: Payer: Self-pay | Admitting: Internal Medicine

## 2021-02-02 ENCOUNTER — Ambulatory Visit (HOSPITAL_COMMUNITY)
Admission: RE | Admit: 2021-02-02 | Discharge: 2021-02-02 | Disposition: A | Discharge status: Home / Self Care | Payer: Medicaid Other | Source: Ambulatory Visit | Attending: Internal Medicine | Admitting: Internal Medicine

## 2021-02-02 ENCOUNTER — Ambulatory Visit: Payer: Medicaid Other | Attending: Internal Medicine | Admitting: Internal Medicine

## 2021-02-02 VITALS — BP 116/75 | HR 81 | Resp 16 | Wt 197.8 lb

## 2021-02-02 DIAGNOSIS — A048 Other specified bacterial intestinal infections: Secondary | ICD-10-CM | POA: Diagnosis not present

## 2021-02-02 DIAGNOSIS — Z23 Encounter for immunization: Secondary | ICD-10-CM | POA: Diagnosis not present

## 2021-02-02 DIAGNOSIS — D126 Benign neoplasm of colon, unspecified: Secondary | ICD-10-CM

## 2021-02-02 DIAGNOSIS — K861 Other chronic pancreatitis: Secondary | ICD-10-CM | POA: Insufficient documentation

## 2021-02-02 DIAGNOSIS — M533 Sacrococcygeal disorders, not elsewhere classified: Secondary | ICD-10-CM

## 2021-02-02 MED ORDER — DOXYCYCLINE HYCLATE 100 MG PO CAPS: 100.0000 mg | ORAL_CAPSULE | Freq: Two times a day (BID) | ORAL | 0 refills | Status: AC

## 2021-02-02 MED ORDER — BISMUTH SUBSALICYLATE 262 MG PO TABS: 524.0000 mg | ORAL_TABLET | Freq: Four times a day (QID) | ORAL | 0 refills | Status: AC

## 2021-02-02 MED ORDER — TRAMADOL HCL 50 MG PO TABS: 50.0000 mg | ORAL_TABLET | Freq: Two times a day (BID) | ORAL | 0 refills | Status: AC | PRN

## 2021-02-02 MED ORDER — METRONIDAZOLE 500 MG PO TABS: 500.0000 mg | ORAL_TABLET | Freq: Three times a day (TID) | ORAL | 0 refills | Status: AC

## 2021-02-02 MED ORDER — OMEPRAZOLE 20 MG PO CPDR: 20.0000 mg | DELAYED_RELEASE_CAPSULE | Freq: Two times a day (BID) | ORAL | 0 refills | Status: DC

## 2021-02-02 NOTE — Progress Notes (Signed)
Patient ID: Kelsey Swanson, female    DOB: [birth year]  MRN: [id number]  CC: Tailbone Pain and Hospitalization Follow-up (ED)   Subjective: Kelsey Swanson is a 50 y.o. female who presents for ER f/u Her concerns today include:  Patient with history of EtOH abuse,  Fatty liver, HL, CTS and alcoholic pancreatitis.  Seen by Dr. Loletha Carrow since last visit with me and had EGD and colonoscopy.  One of the polyps was a tubular adenoma.  Told she would be due for repeat again in 7 years.  Tested positive for H. pylori.  She will be picking up prescriptions today for treatment.  Main concern today is c/o intermittent pain in tailbone for past 7 yrs. Started when she fell on a stair at that time.  Pain has always been intermittent since then but became worse and daily 6 to 7 months ago.  She was worried about other health concerns so she did not pay much attention until now.  More so when sitting and when she rolls over or gets up from bed. Also worse when driving long distance.  No radiation No fever.   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     naproxen (NAPROSYN) 500 MG tablet Take 1 tablet (500 mg total) by mouth 2 (two) times daily with a meal. 30 tablet 0   omeprazole (PRILOSEC)  20 MG capsule Take 1 capsule (20 mg total) by mouth daily. 30 capsule 3   tiZANidine (ZANAFLEX) 4 MG tablet Take 1 tablet (4 mg total) by mouth at bedtime. 30 tablet 0   No current facility-administered medications on file prior to visit.    No Known Allergies  Social History   Socioeconomic History   Marital status: Married    Spouse name: Not on file   Number of children: Not on file   Years of education: Not on file   Highest education level: 6th grade  Occupational History   Occupation: unemployed  Tobacco Use   Smoking status: Former    Packs/day: 0.30    Years: 10.00    Pack years: 3.00    Types: Cigarettes    Quit date: 06/07/2015    Years since quitting: 5.6   Smokeless tobacco: Never  Vaping Use   Vaping Use: Never used  Substance and Sexual Activity   Alcohol use: Not Currently    Comment:  May 2020   Drug use: No   Sexual activity: Yes    Partners: Male    Birth control/protection: Surgical  Other Topics Concern   Not on file  Social History Narrative   Not on file   Social Determinants of Health   Financial Resource Strain: Not on file  Food Insecurity: Not on file  Transportation Needs:  Not on file  Physical Activity: Not on file  Stress: Not on file  Social Connections: Not on file  Intimate Partner Violence: Not on file    Family History  Problem Relation Age of Onset   Diabetes Mother    Diabetes Father    Diabetes Sister    Diabetes Maternal Aunt    Ovarian cancer Maternal Aunt    Diabetes Maternal Uncle    Diabetes Maternal Grandmother    Breast cancer Neg Hx    Colon cancer Neg Hx    Rectal cancer Neg Hx    Stomach cancer Neg Hx     Past Surgical History:  Procedure Laterality Date   BREAST EXCISIONAL BIOPSY Right    BREAST SURGERY     right breast   COLONOSCOPY     ECTOPIC PREGNANCY SURGERY  2012   TUBAL LIGATION Bilateral 03/14/2019   Procedure: POST PARTUM TUBAL LIGATION;  Surgeon: Caren Macadam, MD;  Location:  [facility] LD ORS;  Service: Gynecology;  Laterality: Bilateral;    ROS: Review of Systems Negative except as stated above  PHYSICAL EXAM: BP 116/75   Pulse 81   Resp 16   Wt 197 lb 12.8 oz (89.7 kg)   SpO2 98%   BMI 35.04 kg/m   Wt Readings from Last 3 Encounters:  02/02/21 197 lb 12.8 oz (89.7 kg)  01/21/21 203 lb (92.1 kg)  12/17/20 203 lb 8 oz (92.3 kg)    Physical Exam  General appearance - alert, well appearing, and in no distress Mental status - normal mood, behavior, speech, dress, motor activity, and thought processes Musculoskeletal -mild to moderate tenderness on palpation of the mid coccyx.  No surrounding edema or erythema of the gluteal muscles on either side.  No fistulous track noted.   CMP Latest Ref Rng & Units 11/20/2020 09/18/2020 05/18/2019  Glucose 65 - 99 mg/dL - 76 111(H)  BUN 6 - 24 mg/dL - 11 21(H)  Creatinine 0.57 - 1.00 mg/dL - 0.53(L) 0.46  Sodium 134 - 144 mmol/L - 138 136  Potassium 3.5 - 5.2 mmol/L - 4.1 3.6  Chloride 96 - 106 mmol/L - 101 102  CO2 20 - 29 mmol/L - 24 23  Calcium 8.7 - 10.2 mg/dL - 9.8 9.0  Total Protein 6.0 - 8.5 g/dL 7.7 7.7 -  Total Bilirubin 0.0 - 1.2 mg/dL 0.3 0.3 -  Alkaline Phos 44 - 121 IU/L 70 148(H) -  AST 0 - 40 IU/L 15 88(H) -  ALT 0 - 32 IU/L 31 127(H) -   Lipid Panel     Component Value Date/Time   CHOL 184 09/18/2020 1408   TRIG 137 09/18/2020 1408   HDL 32 (L) 09/18/2020 1408   CHOLHDL 5.8 (H) 09/18/2020 1408   CHOLHDL 4.4 09/02/2017 1132   VLDL 27 09/02/2017 1132   LDLCALC 127 (H) 09/18/2020 1408    CBC    Component Value Date/Time   WBC 8.7 09/18/2020 1408   WBC 6.0 05/18/2019 0520   RBC 4.10 09/18/2020 1408   RBC 4.44 05/18/2019 0520   HGB 12.5 09/18/2020 1408   HCT 37.0 09/18/2020 1408   PLT 309 09/18/2020 1408   MCV 90 09/18/2020 1408   MCH 30.5 09/18/2020 1408   MCH 27.7 05/18/2019 0520   MCHC 33.8 09/18/2020 1408   MCHC 32.0 05/18/2019 0520   RDW 13.1 09/18/2020 1408   LYMPHSABS 2.3  09/19/2018 1409   MONOABS 1.3 (H) 04/11/2017 0351   EOSABS 0.1  09/19/2018 1409   BASOSABS 0.0 09/19/2018 1409    ASSESSMENT AND PLAN: 1. Tail bone pain Chronic coccyx pain that has worsened over the past 6 to 7 months.  We will check inflammatory markers and get x-ray of the coccyx bone.  I have given a limited supply of tramadol to use as needed with the Naprosyn.  Further management will be based on results of the x-ray. - Sedimentation Rate - C-reactive protein - DG Sacrum/Coccyx; Future - traMADol (ULTRAM) 50 MG tablet; Take 1 tablet (50 mg total) by mouth every 12 (twelve) hours as needed for up to 7 days for moderate pain.  Dispense: 14 tablet; Refill: 0  2. Rapid urease test for Helicobacter pylori infection positive Patient to pick up prescription for treatment that was sent over by the gastroenterologist.  3. Adenomatous polyp of colon, unspecified part of colon Colonoscopy report and pathology report noted.  She will be due for colonoscopy again in 7 years.  4. Need for Streptococcus pneumoniae vaccination - PNEUMOCOCCAL CONJUGATE VACCINE 15-VALENT     Patient was given the opportunity to ask questions.  Patient verbalized understanding of the plan and was able to repeat key elements of the plan.   Orders Placed This Encounter  Procedures   PNEUMOCOCCAL CONJUGATE VACCINE 15-VALENT     Requested Prescriptions    No prescriptions requested or ordered in this encounter    No follow-ups on file.  Karle Plumber, MD, FACP

## 2021-02-03 LAB — C-REACTIVE PROTEIN: CRP: 2 mg/L (ref 0–10)

## 2021-02-03 LAB — SEDIMENTATION RATE: Sed Rate: 7 mm/hr (ref 0–32)

## 2021-03-02 ENCOUNTER — Ambulatory Visit: Payer: Medicaid Other | Admitting: Gastroenterology

## 2021-04-15 ENCOUNTER — Encounter: Payer: Self-pay | Admitting: Gastroenterology

## 2021-04-15 ENCOUNTER — Ambulatory Visit: Payer: Medicaid Other | Admitting: Gastroenterology

## 2021-04-15 VITALS — BP 105/60 | HR 83 | Ht 63.0 in | Wt 200.0 lb

## 2021-04-15 DIAGNOSIS — R140 Abdominal distension (gaseous): Secondary | ICD-10-CM | POA: Diagnosis not present

## 2021-04-15 DIAGNOSIS — A048 Other specified bacterial intestinal infections: Secondary | ICD-10-CM | POA: Diagnosis not present

## 2021-04-15 DIAGNOSIS — K5909 Other constipation: Secondary | ICD-10-CM | POA: Diagnosis not present

## 2021-04-15 DIAGNOSIS — K8020 Calculus of gallbladder without cholecystitis without obstruction: Secondary | ICD-10-CM | POA: Diagnosis not present

## 2021-04-15 DIAGNOSIS — R1010 Upper abdominal pain, unspecified: Secondary | ICD-10-CM | POA: Diagnosis not present

## 2021-04-15 MED ORDER — TALICIA 250-12.5-10 MG PO CPDR: 4.0000 | DELAYED_RELEASE_CAPSULE | Freq: Three times a day (TID) | ORAL | 0 refills | Status: AC

## 2021-04-15 NOTE — Progress Notes (Signed)
GI Progress Note  Chief Complaint: Abdominal pain  Subjective  History: Kelsey Swanson was seen in the office 12/17/2020 for dysphagia, abdominal pain, bloating gas and constipation. On 01/21/2021: EGD normal, gastric biopsies positive for H. pylori, and she was treated with bismuth based quadruple therapy.  Empiric 52 French Maloney dilation performed. Colonoscopy normal terminal ileum, 2 small adenomatous polyps, diverticulosis and otherwise normal.  There is no telephone call to this effect, but Kelsey Swanson tells me today that she only took 2 days of the antibiotics for H. pylori because she developed a rash, sores in her mouth and facial swelling. Despite that, her epigastric pain is improved and dysphagia resolved.  She still has generalized abdominal bloating and constipation that is improved if she remembers to take MiraLAX daily.  No recent rectal bleeding.  ROS: Cardiovascular:  no chest pain Respiratory: no dyspnea Remainder of systems negative except as above The patient's Past Medical, Family and Social History were reviewed and are on file in the EMR.  Objective:  Med list reviewed  Current Outpatient Medications:    Amoxicill-Rifabutin-Omeprazole (TALICIA) 824-23.5-36 MG CPDR, Take 4 capsules by mouth 3 (three) times daily for 14 days., Disp: 168 capsule, Rfl: 0   Ascorbic Acid (VITAMIN C) 1000 MG tablet, Take 1,000 mg by mouth daily., Disp: , Rfl:    naproxen (NAPROSYN) 500 MG tablet, Take 1 tablet (500 mg total) by mouth 2 (two) times daily with a meal., Disp: 30 tablet, Rfl: 0   omeprazole (PRILOSEC) 20 MG capsule, Take 1 capsule (20 mg total) by mouth daily., Disp: 30 capsule, Rfl: 3   Vital signs in last 24 hrs: Vitals:   04/15/21 0904  BP: 105/60  Pulse: 83  SpO2: 94%   Wt Readings from Last 3 Encounters:  04/15/21 200 lb (90.7 kg)  02/02/21 197 lb 12.8 oz (89.7 kg)  01/21/21 203 lb (92.1 kg)    Physical Exam  Well-appearing HEENT: sclera  anicteric, oral mucosa moist without lesions Neck: supple, no thyromegaly, JVD or lymphadenopathy Cardiac: RRR without murmurs, S1S2 heard, no peripheral edema Pulm: clear to auscultation bilaterally, normal RR and effort noted Abdomen: soft, no tenderness, with active bowel sounds. No guarding or palpable hepatosplenomegaly. Skin; warm and dry, no jaundice or rash  Labs:   ___________________________________________ Radiologic studies: ADDENDUM REPORT: 12/31/2020 10:02   ADDENDUM: A voice recognition error occurred immediately prior to report sign off. The impression should state the following:   Cholelithiasis without acute cholecystitis or biliary duct dilatation.     Electronically Signed   By: Abigail Miyamoto M.D.   On: 12/31/2020 10:02    Addended by Abigail Miyamoto, MD on 12/31/2020 10:05 AM   Study Result  Narrative & Impression  CLINICAL DATA:  Generalized abdominal pain   EXAM: ABDOMEN ULTRASOUND COMPLETE   COMPARISON:  07/01/2018 abdominal CT   FINDINGS: Gallbladder: Multiple gallstones up to 8 mm. No wall thickening or pericholecystic fluid. Sonographic Murphy's sign was not elicited.   Common bile duct: Diameter: Normal, 3 mm.   Liver: No focal lesion identified. Within normal limits in parenchymal echogenicity. Portal vein is patent on color Doppler imaging with normal direction of blood flow towards the liver.   IVC: No abnormality visualized.   Pancreas: Visualized portion unremarkable.   Spleen: Size and appearance within normal limits.   Right Kidney: Length: 12.1 cm. Echogenicity within normal limits. No mass or hydronephrosis visualized.   Left Kidney: Length: 12.7 cm. Echogenicity within normal limits. No mass  or hydronephrosis visualized.   Abdominal aorta: No aneurysm visualized.   Other findings: None.   IMPRESSION: Mass.   Electronically Signed: By: Abigail Miyamoto M.D. On: 12/25/2020 16:46      ____________________________________________ Other: 1. Surgical [P], colon, transverse and ascending, polyp (2) TUBULAR ADENOMAS. NEGATIVE FOR HIGH-GRADE DYSPLASIA. 2. Surgical [P], gastric biopsies CHRONIC GASTRITIS WITH SUPERFICIAL MUCOSAL EROSIONS. ABUNDANT H. PYLORI ARE PRESENT. NEGATIVE FOR DYSPLASIA AND MALIGNANCY.  _____________________________________________ Assessment & Plan  Assessment: Encounter Diagnoses  Name Primary?   H. pylori infection Yes   Upper abdominal pain    Chronic constipation    Abdominal bloating    Gallstones    Dyspepsia, does not sound typical for biliary colic, especially since it is not bothering her at this point.  I described typical symptoms of biliary colic so she can alert [place] if that is developing, at which point she would need a surgical consultation.  Her H. pylori is most likely incidental since she is feeling better without treatment and stomach was normal at the time of endoscopy.  Nevertheless, it needs treatment and clearance of possible due to the risk of gastric/duodenal ulcer and stomach cancer.  No intestinal metaplasia seen on biopsies.  She had a severe reaction to doxycycline, metronidazole or bismuth.  As current guidelines recommend against using clarithromycin due to the high incidence of resistance, Therapy would be Talicia (amoxicillin/rifabutin) for 14 days.  Prescription sent. Urea breath test about 2 weeks after completing antibiotics.  I would like her to make a concerted effort to increase her fruit and vegetable intake for natural dietary fiber, and she can use the MiraLAX to relieve constipation.  I believe the bloating is related to the constipation and perhaps some dietary triggers/mild digestion as well.  31 minutes were spent on this encounter (including chart review, history/exam, counseling/coordination of care, and documentation) > 50% of that time was spent on counseling and coordination of care.    Nelida Meuse III

## 2021-04-15 NOTE — Patient Instructions (Signed)
If you are age 49 or older, your body mass index should be between 23-30. Your Body mass index is 35.43 kg/m. If this is out of the aforementioned range listed, please consider follow up with your Primary Care Provider.  If you are age 18 or younger, your body mass index should be between 19-25. Your Body mass index is 35.43 kg/m. If this is out of the aformentioned range listed, please consider follow up with your Primary Care Provider.   ________________________________________________________  The [facility] providers would like to encourage you to use [facility] to communicate with providers for non-urgent requests or questions.  Due to long hold times on the telephone, sending your provider a message by [facility] may be a faster and more efficient way to get a response.  Please allow 48 business hours for a response.  Please remember that this is for non-urgent requests.  _______________________________________________________  H. Pylori/Urea Breath Test.  Please follow the instructions below to prepare for your test:  (one day prior to your test) avoid high gas foods, high fiber goods, fruits and fruit juices, vegetables, beans, cereals, fiber supplements, onion and bell peppers. You may eat hamburger, chicken, beef, tuna fish and eggs.  You should have nothing to eat or drink after Midnight.   Do not smoke, chew gum or eat hard candy the morning of your test.  Complete 4 weeks after completing the antibiotics.  2 weeks prior to your test, stop any Pepto-Bismol and/or proton pump inhibitors (Prevacid, Zegerid, Nexium, Prilosec, omeprazole, Protonix, Aciphex, Dexilant).  It was a pleasure to see you today!  Thank you for trusting me with your gastrointestinal care!

## 2021-04-16 ENCOUNTER — Telehealth: Payer: Self-pay

## 2021-04-16 NOTE — Telephone Encounter (Signed)
PA submitted with cover my meds for Talicia   Approved today PA Case: [id number], Status: Approved, Coverage Starts on: 04/16/2021 12:00:00 AM, Coverage Ends on: 04/16/2022 12:00:00 AM.

## 2021-04-17 NOTE — Telephone Encounter (Signed)
Called [facility] and informed them that the PA was approved. Patient cost per pharmacy is $4.

## 2021-04-17 NOTE — Telephone Encounter (Signed)
Patient just called stating her medication is not covered by her insurance please advise.

## 2021-05-19 ENCOUNTER — Other Ambulatory Visit: Payer: Self-pay

## 2021-05-19 ENCOUNTER — Other Ambulatory Visit (HOSPITAL_COMMUNITY)
Admission: RE | Admit: 2021-05-19 | Discharge: 2021-05-19 | Disposition: A | Discharge status: Home / Self Care | Payer: Medicaid Other | Source: Ambulatory Visit | Attending: Internal Medicine | Admitting: Internal Medicine

## 2021-05-19 ENCOUNTER — Encounter: Payer: Self-pay | Admitting: Internal Medicine

## 2021-05-19 ENCOUNTER — Ambulatory Visit: Payer: Medicaid Other | Attending: Internal Medicine | Admitting: Internal Medicine

## 2021-05-19 VITALS — BP 126/86 | HR 76 | Resp 16 | Wt 201.8 lb

## 2021-05-19 DIAGNOSIS — N760 Acute vaginitis: Secondary | ICD-10-CM

## 2021-05-19 DIAGNOSIS — Z124 Encounter for screening for malignant neoplasm of cervix: Secondary | ICD-10-CM | POA: Insufficient documentation

## 2021-05-19 MED ORDER — FLUCONAZOLE 150 MG PO TABS: 150.0000 mg | ORAL_TABLET | Freq: Every day | ORAL | 1 refills | Status: DC

## 2021-05-19 NOTE — Patient Instructions (Signed)
Perimenopausia ?Perimenopause ?La perimenopausia es el momento normal de la vida de una mujer en el que los niveles de estr?geno, la hormona femenina producida por los ovarios, comienzan a disminuir. Esto provoca cambios en los per?odos menstruales antes de que se detengan por completo (menopausia). La perimenopausia puede comenzar de 2 a 8 a?os antes de la menopausia. Durante la perimenopausia, los ovarios pueden o no producir ?vulos y Retail banker a?n puede quedar [place]. ??Cu?les son las causas? ?La perimenopausia es causada por un cambio natural en los niveles hormonales que se produce a medida que las mujeres envejecen. ??Qu? incrementa el riesgo? ?Es m?s probable que la perimenopausia comience a una edad m?s temprana si tiene ciertas afecciones o se ha realizado ciertos tratamientos, incluidos los siguientes: ?Un tumor en la hip?fisis del cerebro. ?Una enfermedad que afecte los ovarios y la producci?n de hormonas. ?Ciertos tratamientos para el c?ncer, como quimioterapia o terapia hormonal, o radioterapia en la pelvis. ?Fumar mucho y consumir alcohol de forma excesiva. ?Antecedentes familiares de menopausia temprana. ??Cu?les son los signos o s?ntomas? ?Los cambios por la perimenopausia afectan de [place] diferente a cada mujer. Los s?ntomas de esta afecci?n pueden incluir los siguientes: ?Acaloramiento. ?Per?odos menstruales irregulares. ?Sudoraci?n nocturna. ?Cambios en el sentimiento respecto de las [employer]. Es posible que el deseo sexual disminuya o que se sienta m?s inc?moda respecto de su sexualidad. ?Sequedad vaginal. ?Dolores de [place]. ?Cambios en el estado de ?nimo. ?Depresi?n. ?Problemas para dormir (insomnio). ?Problemas de memoria o dificultad para concentrarse. ?Irritabilidad. ?Cansancio. ?Aumento de [place]. ?Ansiedad. ?Problemas para quedar embarazada. ??C?mo se diagnostica? ?Esta afecci?n se diagnostica en funci?n de los antecedentes m?dicos, un examen f?sico, la edad, los antecedentes  menstruales y los s?ntomas. Tambi?n le podr?an realizar estudios hormonales. ??C?mo se trata? ?En algunos los casos, no se necesita tratamiento. Usted y el m?dico deben decidir juntos si se debe Arts administrator. El tratamiento se determinar? en funci?n de su cuadro cl?nico y de sus preferencias. Existen varios tratamientos disponibles, como: ?Terapia hormonal para la menopausia. ?Medicamentos para tratar s?ntomas espec?ficos. ?Acupuntura. ?Vitaminas o suplementos herbales. ?Antes de Biochemist, clinical, es importante que le avise al m?dico si tiene antecedentes personales o familiares de lo siguiente: ?Enfermedad card?aca. ?C?ncer de mama. ?Co?gulos de sangre. ?Diabetes. ?Osteoporosis. ?Siga estas instrucciones en su casa: ?Medicamentos ?Use los medicamentos de venta libre y los recetados solamente como se lo haya indicado el m?dico. ?Tome los suplementos vitam?nicos solamente como se lo haya indicado el m?dico. ?Hable con el m?dico antes de empezar a tomar cualquier suplemento herbal. ?Estilo de vida ? ?No consuma ning?n producto que contenga nicotina o tabaco, como cigarrillos, cigarrillos electr?nicos y tabaco de Higher education careers adviser. Si necesita ayuda para dejar de consumir estos productos, consulte al m?dico. ?Realice, por lo menos, 30 minutos de [place] f?sica 5 d?as por semana o m?s. ?Siga una dieta equilibrada que incluya frutas y verduras frescas, cereales integrales, soja, huevos, carnes magras y l?cteos descremados. ?Evite las bebidas con alcohol o cafe?na, as? como las comidas muy condimentadas. Esto podr?a ayudar a Librarian, academic. ?Intente dormir de 7 a 8 horas todas las noches. ?V?stase con capas de prendas que pueda quitarse para controlar el acaloramiento. ?Encuentre modos de controlar el estr?s, por ejemplo, a trav?s de la respiraci?n, meditaci?n o un diario ?ntimo. ?Indicaciones generales ? ?Lleve un registro de sus per?odos menstruales; incluya lo siguiente: ?El momento en que  ocurren. ?Qu? tan abundantes son y cu?nto duran. ?Cu?nto tiempo transcurre entre cada per?odo menstrual. ?Lleve un registro de  los s?ntomas; anote cu?ndo comienzan, con qu? frecuencia ocurren y cu?nto duran. ?Use lubricantes o humectantes vaginales para aliviar la sequedad vaginal y Research officer, political party [place]. ?A?n puede quedar embarazada si tiene periodos irregulares. Aseg?rese de utilizar anticonceptivos durante la perimenopausia si no desea quedar embarazada. ?[place]. Esto es importante. Esto incluye la terapia grupal y la psicoterapia. ?Comun?quese con un m?dico si: ?Tiene una hemorragia vaginal abundante o despide co?gulos de sangre. ?Su per?odo menstrual dura m?s de 2 d?as que lo habitual. ?Sus per?odos menstruales se producen con una frecuencia menor que 21 d?as. ?Sangra despu?s de [employer]. ?Siente dolor durante las [employer]. ?Solicite ayuda de inmediato si tiene: ?Dolor en el pecho, dificultad para respirar o dificultad para hablar. ?Depresi?n grave. ?Dolor al [employer]. ?Dolor de cabeza intenso. ?Problemas de visi?n. ?Resumen ?La perimenopausia es el per?odo en el cual el cuerpo de la mujer comienza a pasar a la menopausia. Esto puede suceder naturalmente o como consecuencia de otros problemas de salud o tratamientos m?dicos. ?La perimenopausia puede comenzar entre 2 y 8 a?os antes de la menopausia y suele durar varios a?os. ?Los s?ntomas de la perimenopausia pueden controlarse con medicamentos, cambios en el estilo de vida y tratamientos complementarios, como la acupuntura. ?Esta informaci?n no tiene Marine scientist el consejo del m?dico. Aseg?rese de hacerle al m?dico cualquier pregunta que tenga. ?Document Revised: 09/11/2019 Document Reviewed: 09/11/2019 ?Elsevier Patient Education ? [place]. ? ?

## 2021-05-19 NOTE — Progress Notes (Signed)
? ? ?Patient ID: Kelsey Swanson, female    DOB: [birth year]  MRN: [id number] ? ?CC: Gynecologic Exam and Sore Throat ? ? ?Subjective: ?Kelsey Swanson is a 51 y.o. female who presents for PAP ?Her concerns today include:  ?Patient with history of EtOH abuse,  Fatty liver, HL, CTS and alcoholic pancreatitis. ?  ? ?GYN History:  ?Pt is G6P4 (1 miscarrage and 1 atopic pregnancy) ?Any hx of abn paps?: in 1996 ?Menses regular or irregular?:  sometimes she skips a mth ?How long does menses last? 7-8 days ?Menstrual flow light or heavy?: heavy ?Method of birth control?:  no ?Any vaginal dischg at this time?: +vaginal itching and white discgh x 2 wks ?Dysuria?: no ?Any hx of STI?:  no ?Sexually active with how many partners:  just her husband ?Desires STI screen: yes ?Last MMG:  12/2020 ?Family hx of uterine, cervical or breast cancer?:  no ? ? ?Patient Active Problem List  ? Diagnosis Date Noted  ? Chronic pancreatitis, unspecified pancreatitis type ([place]) 02/02/2021  ? Adenomatous polyp of colon 02/02/2021  ? Tail bone pain 02/02/2021  ? Unintended weight loss 09/18/2020  ? Breast lump on right side at 11 o'clock position 03/29/2019  ? Status post bilateral salpingectomy 03/15/2019  ? 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 Carpal tunnel syndrome, bilateral 11/14/2018  ? Slow transit constipation 09/19/2018  ? Mass of breast, right 09/19/2018  ? Depression 07/01/2018  ? Acute alcoholic hepatitis 93/57/0177  ? Acute alcoholic pancreatitis 93/90/3009  ? Fatty liver 07/23/2016  ? Obesity (BMI 30.0-34.9) 07/23/2016  ?  ? ?Current Outpatient Medications on File Prior to Visit  ?Medication Sig Dispense Refill  ? Ascorbic Acid (VITAMIN C) 1000 MG tablet Take 1,000 mg by mouth daily.    ? naproxen (NAPROSYN) 500 MG tablet Take 1 tablet (500 mg total) by mouth 2 (two) times daily with a meal. 30 tablet 0  ? omeprazole (PRILOSEC) 20 MG capsule Take 1 capsule (20 mg total) by mouth daily. 30 capsule 3  ? ?No current  facility-administered medications on file prior to visit.  ? ? ?Allergies  ?Allergen Reactions  ? Doxycycline Shortness Of Breath and Swelling  ?  Facial swelling also caused blisters in her mouth  ? Metronidazole Shortness Of Breath and Swelling  ?  Facial swelling and blisters in her mouth  ? ? ?Social History  ? ?Socioeconomic History  ? Marital status: Married  ?  Spouse name: Not on file  ? Number of children: Not on file  ? Years of education: Not on file  ? Highest education level: 6th grade  ?Occupational History  ? Occupation: unemployed  ?Tobacco Use  ? Smoking status: Former  ?  Packs/day: 0.30  ?  Years: 10.00  ?  Pack years: 3.00  ?  Types: Cigarettes  ?  Quit date: 06/07/2015  ?  Years since quitting: 5.9  ? Smokeless tobacco: Never  ?Vaping Use  ? Vaping Use: Never used  ?Substance and Sexual Activity  ? Alcohol use: Not Currently  ?  Comment:  May 2020  ? Drug use: No  ? Sexual activity: Yes  ?  Partners: Male  ?  Birth control/protection: Surgical  ?Other Topics Concern  ? Not on file  ?Social History Narrative  ? Not on file  ? ?Social Determinants of Health  ? ?Financial Resource Strain: Not on file  ?Food Insecurity: Not on file  ?Transportation Needs: Not on file  ?Physical Activity: Not on  file  ?Stress: Not on file  ?Social Connections: Not on file  ?Intimate Partner Violence: Not on file  ? ? ?Family History  ?Problem Relation Age of Onset  ? Diabetes Mother   ? Diabetes Father   ? Diabetes Sister   ? Diabetes Maternal Aunt   ? Ovarian cancer Maternal Aunt   ? Diabetes Maternal Uncle   ? Diabetes Maternal Grandmother   ? Breast cancer Neg Hx   ? Colon cancer Neg Hx   ? Rectal cancer Neg Hx   ? Stomach cancer Neg Hx   ? ? ?Past Surgical History:  ?Procedure Laterality Date  ? BREAST EXCISIONAL BIOPSY Right   ? BREAST SURGERY    ? right breast  ? COLONOSCOPY    ? ECTOPIC PREGNANCY SURGERY  2012  ? TUBAL LIGATION Bilateral 03/14/2019  ? Procedure: POST PARTUM TUBAL LIGATION;  Surgeon: Caren Macadam, MD;  Location: [facility] LD ORS;  Service: Gynecology;  Laterality: Bilateral;  ? ? ?ROS: ?Review of Systems ?Negative except as stated above ? ?PHYSICAL EXAM: ?BP 126/86   Pulse 76   Resp 16   Wt 201 lb 12.8 oz (91.5 kg)   SpO2 95%   BMI 35.75 kg/m?   ?Physical Exam ? ?General appearance - alert, well appearing, and in no distress ?Mental status - alert, oriented to person, place, and time ?Pelvic - Doloris Hall CMA present:  normal external genitalia, vulva, vagina, cervix, uterus and adnexa.  Patient had a little blood around the cervix and from the cervical os suggesting onset of menstrual cycle. ? ? ? ?  Latest Ref Rng & Units 11/20/2020  ?  3:15 PM 09/18/2020  ?  2:08 PM 05/18/2019  ?  5:20 AM  ?CMP  ?Glucose 65 - 99 mg/dL  76   111    ?BUN 6 - 24 mg/dL  11   21    ?Creatinine 0.57 - 1.00 mg/dL  0.53   0.46    ?Sodium 134 - 144 mmol/L  138   136    ?Potassium 3.5 - 5.2 mmol/L  4.1   3.6    ?Chloride 96 - 106 mmol/L  101   102    ?CO2 20 - 29 mmol/L  24   23    ?Calcium 8.7 - 10.2 mg/dL  9.8   9.0    ?Total Protein 6.0 - 8.5 g/dL 7.7   7.7     ?Total Bilirubin 0.0 - 1.2 mg/dL 0.3   0.3     ?Alkaline Phos 44 - 121 IU/L 70   148     ?AST 0 - 40 IU/L 15   88     ?ALT 0 - 32 IU/L 31   127     ? ?Lipid Panel  ?   ?Component Value Date/Time  ? CHOL 184 09/18/2020 1408  ? TRIG 137 09/18/2020 1408  ? HDL 32 (L) 09/18/2020 1408  ? CHOLHDL 5.8 (H) 09/18/2020 1408  ? CHOLHDL 4.4 09/02/2017 1132  ? VLDL 27 09/02/2017 1132  ? LDLCALC 127 (H) 09/18/2020 1408  ? ? ?CBC ?   ?Component Value Date/Time  ? WBC 8.7 09/18/2020 1408  ? WBC 6.0 05/18/2019 0520  ? RBC 4.10 09/18/2020 1408  ? RBC 4.44 05/18/2019 0520  ? HGB 12.5 09/18/2020 1408  ? HCT 37.0 09/18/2020 1408  ? PLT 309 09/18/2020 1408  ? MCV 90 09/18/2020 1408  ? MCH 30.5 09/18/2020 1408  ? MCH 27.7 05/18/2019 0520  ?  MCHC 33.8 09/18/2020 1408  ? MCHC 32.0 05/18/2019 0520  ? RDW 13.1 09/18/2020 1408  ? LYMPHSABS 2.3 09/19/2018 1409  ? MONOABS 1.3 (H)  04/11/2017 0351  ? EOSABS 0.1 09/19/2018 1409  ? BASOSABS 0.0 09/19/2018 1409  ? ? ?ASSESSMENT AND PLAN: ? ?1. Pap smear for cervical cancer screening ?- Cytology - PAP ?- Cervicovaginal ancillary only ? ?2. Acute vaginitis ?Likely yeast.  We will treat with Diflucan. ?- fluconazole (DIFLUCAN) 150 MG tablet; Take 1 tablet (150 mg total) by mouth daily.  Dispense: 1 tablet; Refill: 1 ? ? ?Patient was given the opportunity to ask questions.  Patient verbalized understanding of the plan and was able to repeat key elements of the plan.  ? ?This documentation was completed using Radio producer.  Any transcriptional errors are unintentional. ? ?No orders of the defined types were placed in this encounter. ? ? ? ?Requested Prescriptions  ? ? No prescriptions requested or ordered in this encounter  ? ? ?No follow-ups on file. ? ?Karle Plumber, MD, FACP ?

## 2021-05-20 LAB — CERVICOVAGINAL ANCILLARY ONLY
Bacterial Vaginitis (gardnerella): NEGATIVE
Candida Glabrata: NEGATIVE
Candida Vaginitis: NEGATIVE
Chlamydia: NEGATIVE
Comment: NEGATIVE
Comment: NEGATIVE
Comment: NEGATIVE
Comment: NEGATIVE
Comment: NEGATIVE
Comment: NORMAL
Neisseria Gonorrhea: NEGATIVE
Trichomonas: NEGATIVE

## 2021-05-21 DIAGNOSIS — R1010 Upper abdominal pain, unspecified: Secondary | ICD-10-CM | POA: Diagnosis not present

## 2021-05-21 DIAGNOSIS — K8020 Calculus of gallbladder without cholecystitis without obstruction: Secondary | ICD-10-CM | POA: Diagnosis not present

## 2021-05-21 DIAGNOSIS — R140 Abdominal distension (gaseous): Secondary | ICD-10-CM | POA: Diagnosis not present

## 2021-05-21 DIAGNOSIS — A048 Other specified bacterial intestinal infections: Secondary | ICD-10-CM | POA: Diagnosis not present

## 2021-05-21 DIAGNOSIS — K5909 Other constipation: Secondary | ICD-10-CM | POA: Diagnosis not present

## 2021-05-21 LAB — CYTOLOGY - PAP
Comment: NEGATIVE
Diagnosis: NEGATIVE
High risk HPV: NEGATIVE

## 2021-06-02 ENCOUNTER — Telehealth: Payer: Self-pay | Admitting: Gastroenterology

## 2021-06-02 NOTE — Telephone Encounter (Signed)
Lm on vm for patient to return call 

## 2021-06-02 NOTE — Telephone Encounter (Signed)
Fax report from [facility] laboratory notes negative urea breath test from 05/22/2021 ? ?(Report not in epic inbox) ? ?Please give her the good news that the H. pylori cleared with the second round of antibiotics ? ?HD ?

## 2021-06-04 NOTE — Telephone Encounter (Signed)
Lm on vm for patient to return call 

## 2021-06-05 NOTE — Telephone Encounter (Signed)
Lm on vm for patient to return call 

## 2021-06-08 NOTE — Telephone Encounter (Signed)
No return call received. Letter mailed to pt with results. ?

## 2021-06-23 ENCOUNTER — Ambulatory Visit: Payer: Medicaid Other | Admitting: Internal Medicine

## 2021-10-23 ENCOUNTER — Ambulatory Visit: Payer: Self-pay

## 2021-10-23 NOTE — Telephone Encounter (Signed)
  Chief Complaint: Ha and dizziness Symptoms: HA all over her head Frequency: 2 weeks Pertinent Negatives: Patient denies Fever Disposition: '[x]'$ ED /'[x]'$ Urgent Care (no appt availability in office) / '[]'$ Appointment(In office/virtual)/ '[]'$  [facility]/ '[]'$ Home Care/ '[]'$ Refused Recommended Disposition /'[x]'$ [facility]/ '[x]'$  Follow-up with PCP Additional Notes: Pt states she has had a terrible HA off and on for the past 2 weeks.  Pt states that she has used OTC pain medications, but they are no longer working. Pt states that she is unable to go to UC or ED as she has no one to watch her children. Pt will go to mobile clinic on Tuesday and will try to go to ED or UC. She will call EMS if needed. Also scheduled follow up appt for pt.   Summary: Dizziness/Headaches   Best contact: [phone] pt has dizziness and headaches. Needs appt      Reason for Disposition  [1] SEVERE headache (e.g., excruciating) AND [2] not improved after 2 hours of pain medicine  Answer Assessment - Initial Assessment Questions 1. LOCATION: "Where does it hurt?"      All over the head 2. ONSET: "When did the headache start?" (Minutes, hours or days)      2 weeks 3. PATTERN: "Does the pain come and go, or has it been constant since it started?"     Comes and goes but everyday 4. SEVERITY: "How bad is the pain?" and "What does it keep you from doing?"  (e.g., Scale 1-10; mild, moderate, or severe)   - MILD (1-3): doesn't interfere with normal activities    - MODERATE (4-7): interferes with normal activities or awakens from sleep    - SEVERE (8-10): excruciating pain, unable to do any normal activities        10/10 5. RECURRENT SYMPTOM: "Have you ever had headaches before?" If Yes, ask: "When was the last time?" and "What happened that time?"      Yes  6. CAUSE: "What do you think is causing the headache?"     Unsure 7. MIGRAINE: "Have you been diagnosed with migraine headaches?" If Yes, ask: "Is this  headache similar?"      no 8. HEAD INJURY: "Has there been any recent injury to the head?"      no 9. OTHER SYMPTOMS: "Do you have any other symptoms?" (fever, stiff neck, eye pain, sore throat, cold symptoms)     Dizziness 10. PREGNANCY: "Is there any chance you are pregnant?" "When was your last menstrual period?"       no  Protocols used: Headache-A-AH

## 2021-10-28 NOTE — Telephone Encounter (Signed)
FYI/Update.------DD,RMA

## 2021-10-28 NOTE — Telephone Encounter (Signed)
LVM for pt to RTC.-----DD,RMA

## 2021-10-28 NOTE — Telephone Encounter (Signed)
Routing to CMA 

## 2021-10-30 NOTE — Telephone Encounter (Signed)
LVM for pt to RTC . Letter mailed to pt.-------DD,RMA

## 2021-11-26 NOTE — Telephone Encounter (Signed)
Pt. Calling in tears. States her appointment for next week was changed to 12/24/21.States she cannot wait that long because she has been "very sick with headaches and dizziness." "I can't go and sit in ED." Asking to bo worked in this week. Please advise pt.

## 2021-12-01 ENCOUNTER — Ambulatory Visit: Payer: Medicaid Other | Admitting: Nurse Practitioner

## 2021-12-03 ENCOUNTER — Encounter (HOSPITAL_COMMUNITY): Payer: Self-pay

## 2021-12-03 ENCOUNTER — Emergency Department (HOSPITAL_COMMUNITY)
Admission: EM | Admit: 2021-12-03 | Discharge: 2021-12-04 | Disposition: A | Discharge status: Home / Self Care | Payer: Medicaid Other | Attending: Emergency Medicine | Admitting: Emergency Medicine

## 2021-12-03 ENCOUNTER — Other Ambulatory Visit: Payer: Self-pay

## 2021-12-03 ENCOUNTER — Emergency Department (HOSPITAL_COMMUNITY): Payer: Medicaid Other

## 2021-12-03 DIAGNOSIS — R519 Headache, unspecified: Secondary | ICD-10-CM | POA: Insufficient documentation

## 2021-12-03 DIAGNOSIS — G44209 Tension-type headache, unspecified, not intractable: Secondary | ICD-10-CM

## 2021-12-03 LAB — BASIC METABOLIC PANEL
Anion gap: 9 (ref 5–15)
BUN: 8 mg/dL (ref 6–20)
CO2: 26 mmol/L (ref 22–32)
Calcium: 8.9 mg/dL (ref 8.9–10.3)
Chloride: 105 mmol/L (ref 98–111)
Creatinine, Ser: 0.46 mg/dL (ref 0.44–1.00)
GFR, Estimated: >60 mL/min (ref >60)
Glucose, Bld: 112 mg/dL — ABNORMAL HIGH (ref 70–99)
Potassium: 4.1 mmol/L (ref 3.5–5.1)
Sodium: 140 mmol/L (ref 135–145)

## 2021-12-03 LAB — CBC
HCT: 37.5 % (ref 36.0–46.0)
Hemoglobin: 13 g/dL (ref 12.0–15.0)
MCH: 30 pg (ref 26.0–34.0)
MCHC: 34.7 g/dL (ref 30.0–36.0)
MCV: 86.4 fL (ref 80.0–100.0)
Platelets: 45 10*3/uL — ABNORMAL LOW (ref 150–400)
RBC: 4.34 MIL/uL (ref 3.87–5.11)
RDW: 12.6 % (ref 11.5–15.5)
WBC: 7 10*3/uL (ref 4.0–10.5)
nRBC: 0 % (ref 0.0–0.2)

## 2021-12-03 LAB — URINALYSIS, ROUTINE W REFLEX MICROSCOPIC
Bilirubin Urine: NEGATIVE
Glucose, UA: NEGATIVE mg/dL
Hgb urine dipstick: NEGATIVE
Ketones, ur: 20 mg/dL — AB
Nitrite: NEGATIVE
Protein, ur: NEGATIVE mg/dL
Specific Gravity, Urine: 1.017 (ref 1.005–1.030)
pH: 6 (ref 5.0–8.0)

## 2021-12-03 NOTE — ED Provider Triage Note (Signed)
Emergency Medicine Provider Triage Evaluation Note  Kelsey Swanson , a 51 y.o. female  was evaluated in triage.  Pt complains of increasing headaches over the 3 months, more so over the last month.  With nausea, though denies light or sound sensitivity.  Denies recent fevers or neck stiffness.  No history of headaches.  Without blurry vision or double vision, however states eye movements began to hurt.  Tries Tylenol and ibuprofen without relief.  Sometimes accompanied with dizziness.  Review of Systems  Positive:  Negative: See above  Physical Exam  BP 112/83   Pulse 83   Temp 98.7 F (37.1 C)   Resp 18   Ht '5\' 3"'$  (1.6 m)   Wt 88 kg   SpO2 99%   BMI 34.37 kg/m  Gen:   Awake, no distress   Resp:  Normal effort  MSK:   Moves extremities without difficulty  Other:  PERRLA.  Normal EOMs.  Gaze aligned appropriately.  No meningismus.  No photophobia appreciated.  Medical Decision Making  Medically screening exam initiated at 5:29 PM.  Appropriate orders placed.  Marielouise Channon Brougher was informed that the remainder of the evaluation will be completed by another provider, this initial triage assessment does not replace that evaluation, and the importance of remaining in the ED until their evaluation is complete.     Prince Rome, PA-C 23/53/61 1732

## 2021-12-03 NOTE — ED Triage Notes (Signed)
Pt arrived POV w/ c/o headaches x 3-4 months. Pt reports over the last month they are more constant. Pt has been using ice packs on her head. Pt reports she started hurting bilateral jaws w/ difficulty chewing. Pt reports lightheadedness, and increased pain w/ eye movement. No hx of migraines.

## 2021-12-04 MED ORDER — DIPHENHYDRAMINE HCL 50 MG/ML IJ SOLN
25.0000 mg | Freq: Once | INTRAMUSCULAR | Status: AC
  Administered 2021-12-04: 25 mg via INTRAVENOUS
  Filled 2021-12-04: qty 1

## 2021-12-04 MED ORDER — METOCLOPRAMIDE HCL 5 MG/ML IJ SOLN
10.0000 mg | Freq: Once | INTRAMUSCULAR | Status: AC
  Administered 2021-12-04: 10 mg via INTRAVENOUS
  Filled 2021-12-04: qty 2

## 2021-12-04 MED ORDER — CEPHALEXIN 250 MG PO CAPS
500.0000 mg | ORAL_CAPSULE | Freq: Once | ORAL | Status: AC
  Administered 2021-12-04: 500 mg via ORAL
  Filled 2021-12-04: qty 2

## 2021-12-04 MED ORDER — DEXAMETHASONE SODIUM PHOSPHATE 10 MG/ML IJ SOLN
10.0000 mg | Freq: Once | INTRAMUSCULAR | Status: AC
  Administered 2021-12-04: 10 mg via INTRAVENOUS
  Filled 2021-12-04: qty 1

## 2021-12-04 NOTE — ED Notes (Signed)
Patient verbalizes understanding of d/c instructions. Opportunities for questions and answers were provided. Pt d/c from ED and ambulated to waiting room where family is picking pt up.

## 2021-12-04 NOTE — Discharge Instructions (Signed)
The CT scan of your head was normal.  I recommend that you follow-up with a neurologist for your constant headaches.  A neurology office should contact you.  Your platelet count was low, but the rest of your blood work was normal.  Please have your platelet count rechecked by your doctor.  Low platelet means that you will bleed and bruise easier.

## 2021-12-04 NOTE — ED Provider Notes (Signed)
[facility] Emergency Department Provider Note MRN:  [id number]  Arrival date & time: 12/04/21     Chief Complaint   Headache   History of Present Illness   Kelsey Swanson is a 51 y.o. year-old female presents to the ED with chief complaint of headaches.  She states that she has had near constant daily headaches for the past 3 months.  She denies any vision changes.  She states that the pain wraps around from the back of her head to the front of her head.  She has tried OTC medications with some relief.  She denies fevers or chills.  She endorses sensitivity to light and sound.  She states that she has an appointment with her doctor in November.  History provided by patient.   Review of Systems  Pertinent positive and negative review of systems noted in HPI.    Physical Exam   Vitals:   12/03/21 1618 12/03/21 2156  BP: 112/83 108/81  Pulse: 83 60  Resp: 18 18  Temp: 98.7 F (37.1 C) 98.6 F (37 C)  SpO2: 99% 100%    CONSTITUTIONAL:  non toxic-appearing, NAD NEURO:  Alert and oriented x 3, CN 3-12 grossly intact, normal finger to nose, no pronator drift EYES:  eyes equal and reactive ENT/NECK:  Supple, no stridor  CARDIO:  normal rate, regular rhythm, appears well-perfused  PULM:  No respiratory distress, CTAB GI/GU:  non-distended,  MSK/SPINE:  No gross deformities, no edema, moves all extremities  SKIN:  no rash, atraumatic   *Additional and/or pertinent findings included in MDM below  Diagnostic and Interventional Summary     Labs Reviewed  BASIC METABOLIC PANEL - Abnormal; Notable for the following components:      Result Value   Glucose, Bld 112 (*)    All other components within normal limits  CBC - Abnormal; Notable for the following components:   Platelets 45 (*)    All other components within normal limits  URINALYSIS, ROUTINE W REFLEX MICROSCOPIC - Abnormal; Notable for the following components:   APPearance HAZY (*)     Ketones, ur 20 (*)    Leukocytes,Ua MODERATE (*)    Bacteria, UA FEW (*)    All other components within normal limits    CT Head Wo Contrast  Final Result      Medications  metoCLOPramide (REGLAN) injection 10 mg (has no administration in time range)  diphenhydrAMINE (BENADRYL) injection 25 mg (has no administration in time range)  dexamethasone (DECADRON) injection 10 mg (has no administration in time range)  cephALEXin (KEFLEX) capsule 500 mg (has no administration in time range)     Procedures  /  Critical Care Procedures  ED Course and Medical Decision Making  I have reviewed the triage vital signs, the nursing notes, and pertinent available records from the EMR.  Social Determinants Affecting Complexity of Care: Patient has no clinically significant social determinants affecting this chief complaint..   ED Course: Clinical Course as of 12/04/21 0041  Fri Dec 04, 2021  5188 Platelet count 45, decreased from 2022.  We will have patient follow-up with PCP. [RB]  0038 CT head interpreted by me, shows no obvious ICH. [RB]  0038 Urinalysis concerning for infection.  Will treat with Keflex. [RB]    Clinical Course User Index [RB] Montine Circle, PA-C    Medical Decision Making Patient here with constant daily headaches for the past 3 months.  She does have some significant cervical paraspinal muscle  and upper trapezius tenderness.  She states that the headache wraps around her entire head and feels like squeezing.  Symptoms sound consistent with tension headaches.  She denies any vision changes.  She has not had any fever.  She is nontoxic-appearing.  She has normal neurologic exam.  CT head was ordered in triage, and is negative for intracranial hemorrhage.  Labs ordered in triage are notable for platelet count of 45.  We will have her follow-up with her regular doctor for this.  She would likely benefit from neurology referral for the constant headaches.  Headache improved  on reassessment.   Amount and/or Complexity of Data Reviewed Labs:  Decision-making details documented in ED Course. Radiology:  Decision-making details documented in ED Course.  Risk Prescription drug management.     Consultants: No consultations were needed in caring for this patient.   Treatment and Plan: Emergency department workup does not suggest an emergent condition requiring admission or immediate intervention beyond  what has been performed at this time. The patient is safe for discharge and has  been instructed to return immediately for worsening symptoms, change in  symptoms or any other concerns  Patient discussed with attending physician, Dr. Laverta Baltimore, who reviewed labs with me and we discussed the case and incidental low platelet count.  Agrees with plan for PCP follow-up for recheck of platelets and neuro follow-up for headaches.  Final Clinical Impressions(s) / ED Diagnoses  No diagnosis found.  ED Discharge Orders          Ordered    Ambulatory referral to Neurology       Comments: An appointment is requested in approximately: 4 weeks   12/04/21 0040              Discharge Instructions Discussed with and Provided to Patient:     Discharge Instructions      The CT scan of your head was normal.  I recommend that you follow-up with a neurologist for your constant headaches.  A neurology office should contact you.  Your platelet count was low, but the rest of your blood work was normal.  Please have your platelet count rechecked by your doctor.  Low platelet means that you will bleed and bruise easier.       Montine Circle, PA-C 12/04/21 0221    Margette Fast, MD 12/04/21 [phone]

## 2021-12-09 ENCOUNTER — Encounter: Payer: Self-pay | Admitting: Neurology

## 2021-12-22 NOTE — Progress Notes (Unsigned)
Initial neurology clinic note  Kelsey Swanson MRN: [id number] DOB: 07/02/1970  Referring provider: Montine Circle, PA-C  Primary care provider: [name], MD  Reason for consult:  headaches  Subjective:  This is [name], a 51 y.o. right-handed female with a medical history of EtOH abuse c/b pancreatitis, fatty liver, HLD, depression, H. Pylori infection, and bilateral CTS who presents to neurology clinic with headaches. The patient is alone today.  Patient has had headaches for greater than 4 months. They have been worse over the last 2 months. Her whole head hurts. She has difficulty describing the pain, but it is intense. The pain is > 10/10 per patient. At no point over the last 4 months has she had 0/10 pain in her head. When she lays down and puts a cold ice pack on her head, it helps. When she gets up and tries to do something, the pain worsens. For the past month, she has had a constant headache. She is currently going through menopause, and she was told this may be due to this.  She cannot sleep well. She endorses phonophobia, nausea, vomiting, and dizziness. She denies photophobia. For the past month, she has barely left the house. It is worse with activity. She denies symptoms worse with change of position. She denies vision changes. She can have tenderness in temple when she has headaches and can have pain when chewing. She endorses tingling in hands and legs over the last 2 months.  She has felt a little better over the last couple of days.  She tried ibuprofen, which did not help. She tried motrin w/ tylenol which helped very little. She would take one every 2-3 days.  She never had headaches previous to this.   Patient presented to the ED on 12/03/21 for near constant headaches for 3 months. The pain was described as wrapping around the head. She endorsed sensitivity to light and sound. CT head showed no acute process. Labs were significant for  platelets of 45. Per documentation, headache improved while in the ED. Per patient, the pain was not significantly helped with the cocktail.  Patient currently has a 5/10 headache.  Patient saw her PCP this morning who prescribed topamax 25 mg for 1 week, then increase to 50 mg daily and sumatriptan 50 mg at start of headache. Patient has not picked up either medication yet.  Patient also mentions that she has had poor appetite and lost about 10 pounds over the last month.  She is a former smoker but quit smoking over 6 years ago.  She drinks [employer] coffee 3-4 times per week.  MEDICATIONS:  Outpatient Encounter Medications as of 12/24/2021  Medication Sig Note   Ascorbic Acid (VITAMIN C) 1000 MG tablet Take 1,000 mg by mouth daily.    methylPREDNISolone (MEDROL DOSEPAK) 4 MG TBPK tablet Take 6 tablets on day 1, 5 tablets on day 2, 4 tablets on day 3, 3 tablets on day 4, 2 tablets on day 5, 1 tablet on day 6, then stop    naproxen (NAPROSYN) 500 MG tablet Take 1 tablet (500 mg total) by mouth 2 (two) times daily with a meal. 04/15/2021: Only takes when eating spicy food   SUMAtriptan (IMITREX) 50 MG tablet 1 tab PO at start of headache.  May repeat in 2 hrs if no relief.  Max 2 tabs/24 hrs    topiramate (TOPAMAX) 25 MG tablet 1 tab PO QHS x 1 wk then 2 tabs PO  QHS    fluconazole (DIFLUCAN) 150 MG tablet Take 1 tablet (150 mg total) by mouth daily. (Patient not taking: Reported on 12/24/2021)    omeprazole (PRILOSEC) 20 MG capsule Take 1 capsule (20 mg total) by mouth daily. (Patient not taking: Reported on 12/24/2021)    promethazine (PHENERGAN) 12.5 MG tablet Take 1 tablet (12.5 mg total) by mouth 2 (two) times daily as needed for nausea or vomiting. (Patient not taking: Reported on 12/24/2021)    No facility-administered encounter medications on file as of 12/24/2021.    PAST MEDICAL HISTORY: Past Medical History:  Diagnosis Date   Abnormal Pap smear 1996   Fatty liver    Headache     Pancreatitis    Pancreatitis, chronic (HCC)    Thrombocytopenia ([place])    Vaginal Pap smear, abnormal     PAST SURGICAL HISTORY: Past Surgical History:  Procedure Laterality Date   BREAST EXCISIONAL BIOPSY Right    BREAST SURGERY     right breast   COLONOSCOPY     ECTOPIC PREGNANCY SURGERY  2012   TUBAL LIGATION Bilateral 03/14/2019   Procedure: POST PARTUM TUBAL LIGATION;  Surgeon: Caren Macadam, MD;  Location: [facility] LD ORS;  Service: Gynecology;  Laterality: Bilateral;    ALLERGIES: Allergies  Allergen Reactions   Doxycycline Shortness Of Breath and Swelling    Facial swelling also caused blisters in her mouth   Metronidazole Shortness Of Breath and Swelling    Facial swelling and blisters in her mouth    FAMILY HISTORY: Family History  Problem Relation Age of Onset   Diabetes Mother    Diabetes Father    Diabetes Sister    Diabetes Maternal Aunt    Ovarian cancer Maternal Aunt    Diabetes Maternal Uncle    Diabetes Maternal Grandmother    Breast cancer Neg Hx    Colon cancer Neg Hx    Rectal cancer Neg Hx    Stomach cancer Neg Hx     SOCIAL HISTORY: Social History   Tobacco Use   Smoking status: Former    Packs/day: 0.30    Years: 10.00    Total pack years: 3.00    Types: Cigarettes    Quit date: 06/07/2015    Years since quitting: 6.5   Smokeless tobacco: Never  Vaping Use   Vaping Use: Never used  Substance Use Topics   Alcohol use: Not Currently    Comment:  May 2020   Drug use: No   Social History   Social History Narrative   Are you right handed or left handed? Right   Are you currently employed ? no   Caffeine occas.   Do you live at home alone?no   Who lives with you? Husband and 2 kids   What type of home do you live in: 1 story or 2 story? Two story        Objective:  General: No acute distress.  Patient appears well-groomed.   Head:  Normocephalic/atraumatic Eyes:  fundi examined but not visualized Neck: supple, no  paraspinal tenderness, full range of motion Back: No paraspinal tenderness Heart: regular rate and rhythm Lungs: Clear to auscultation bilaterally. Vascular: No carotid bruits.  Neurological Exam: Mental status: alert and oriented to person, place, and time, speech fluent and not dysarthric, language intact.  Cranial nerves: CN I: not tested CN II: pupils equal, round and reactive to light, visual fields intact CN III, IV, VI:  full range of motion, no nystagmus, no  ptosis CN V: facial sensation intact. CN VII: upper and lower face symmetric CN VIII: hearing intact CN IX, X: gag intact, uvula midline CN XI: sternocleidomastoid and trapezius muscles intact CN XII: tongue midline  Bulk & Tone: normal Motor:  muscle strength 5/5 throughout Deep Tendon Reflexes:  2+ throughout Sensation:  sensation intact in all extremities Finger to nose testing:  Without dysmetria.   Heel to shin:  Without dysmetria.   Gait:  Normal station and stride.  Romberg negative.   Labs and Imaging review: Internal labs: Lab Results  Component Value Date   HGBA1C 5.7 (H) 09/18/2020   Lab Results  Component Value Date   VHQIONGE95 284 05/03/2017   Lab Results  Component Value Date   TSH 1.510 09/18/2020   Lab Results  Component Value Date   ESRSEDRATE 7 02/02/2021   CBC (12/03/21): significant for platelets of 45 (previously 200s to 300s) BMP significant for mildly elevated glucose  Imaging: CT head wo contrast (12/03/21): FINDINGS: Brain: Normal anatomic configuration. No abnormal intra or extra-axial mass lesion or fluid collection. No abnormal mass effect or midline shift. No evidence of acute intracranial hemorrhage or infarct. Ventricular size is normal. Cerebellum unremarkable.   Vascular: Unremarkable   Skull: Intact   Sinuses/Orbits: Paranasal sinuses are clear. Orbits are unremarkable.   Other: Mastoid air cells and middle ear cavities are clear.   IMPRESSION: No acute  intracranial abnormality.  Assessment/Plan:  Kelsey Swanson is a 51 y.o. female who presents for evaluation of headache. She has a relevant medical history of EtOH abuse c/b pancreatitis, fatty liver, HLD, depression, H. Pylori infection, and bilateral CTS. Her neurological examination is essentially normal today. Available diagnostic data is significant for [facility] showing no acute process. CBC was recently significant for a new thrombocytopenia (45). The etiology of patient's new onset headaches is currently unclear. While this could represent migraines, she has no prior history. Perhaps hormonal changes around menopause are a trigger though. GCA is another consideration given her tenderness to palpation in the temple region and jaw pain with chewing. Her symptoms may be most consistent with new daily persistent headache though. I will work up and treat as below.  PLAN: -Blood work: ESR, CRP, B12, TSH (already ordered by PCP this am) -MRI/MRA brain w/wo contrast -Medrol dose pack to attempt to break current headache -Pain set forth by PCP this morning is a reasonable starting point, so I encouraged patient to start these medications: -Headache rescue:  Sumatriptan 50 mg PRN -Headache prevention:  Topamax 25 mg for 1 week then increase to 50 mg thereafter -Limit use of pain relievers to no more than 2 days out of week to prevent risk of rebound or medication-overuse headache. -Keep headache diary -Patient to call with new or worsening symptoms, if she is not getting relief, or has side effects or problems with her medications  -Follow up in 2 months  The impression above as well as the plan as outlined below were extensively discussed with the patient who voiced understanding. All questions were answered to their satisfaction.  When available, results of the above investigations and possible further recommendations will be communicated to the patient via telephone/MyChart. Patient to call office  if not contacted after expected testing turnaround time.   Total time spent reviewing records, interview, history/exam, documentation, and coordination of care on day of encounter:  60 min   Thank you for allowing me to participate in patient's care.  If I can answer any additional  questions, I would be pleased to do so.  Kai Levins, MD   CC: [name], MD [address] [place] [id number]  CC: Referring provider: Montine Circle, PA-C [address] [place],  [place] [id number]

## 2021-12-24 ENCOUNTER — Other Ambulatory Visit (INDEPENDENT_AMBULATORY_CARE_PROVIDER_SITE_OTHER): Payer: Medicaid Other

## 2021-12-24 ENCOUNTER — Encounter: Payer: Self-pay | Admitting: Internal Medicine

## 2021-12-24 ENCOUNTER — Ambulatory Visit: Payer: Medicaid Other | Attending: Nurse Practitioner | Admitting: Internal Medicine

## 2021-12-24 ENCOUNTER — Encounter: Payer: Self-pay | Admitting: Neurology

## 2021-12-24 ENCOUNTER — Ambulatory Visit: Payer: Medicaid Other | Admitting: Neurology

## 2021-12-24 VITALS — BP 105/70 | HR 70 | Temp 98.1°F | Ht 63.0 in | Wt 191.8 lb

## 2021-12-24 DIAGNOSIS — K625 Hemorrhage of anus and rectum: Secondary | ICD-10-CM | POA: Insufficient documentation

## 2021-12-24 DIAGNOSIS — R6884 Jaw pain: Secondary | ICD-10-CM

## 2021-12-24 DIAGNOSIS — G4452 New daily persistent headache (NDPH): Secondary | ICD-10-CM

## 2021-12-24 DIAGNOSIS — R112 Nausea with vomiting, unspecified: Secondary | ICD-10-CM

## 2021-12-24 DIAGNOSIS — R7303 Prediabetes: Secondary | ICD-10-CM | POA: Diagnosis not present

## 2021-12-24 DIAGNOSIS — G43709 Chronic migraine without aura, not intractable, without status migrainosus: Secondary | ICD-10-CM | POA: Diagnosis not present

## 2021-12-24 DIAGNOSIS — D696 Thrombocytopenia, unspecified: Secondary | ICD-10-CM | POA: Diagnosis not present

## 2021-12-24 DIAGNOSIS — Z23 Encounter for immunization: Secondary | ICD-10-CM | POA: Diagnosis not present

## 2021-12-24 DIAGNOSIS — G43911 Migraine, unspecified, intractable, with status migrainosus: Secondary | ICD-10-CM

## 2021-12-24 DIAGNOSIS — Z8719 Personal history of other diseases of the digestive system: Secondary | ICD-10-CM

## 2021-12-24 DIAGNOSIS — R634 Abnormal weight loss: Secondary | ICD-10-CM | POA: Diagnosis not present

## 2021-12-24 DIAGNOSIS — K6289 Other specified diseases of anus and rectum: Secondary | ICD-10-CM | POA: Insufficient documentation

## 2021-12-24 DIAGNOSIS — F40298 Other specified phobia: Secondary | ICD-10-CM

## 2021-12-24 LAB — SEDIMENTATION RATE: Sed Rate: 11 mm/hr (ref 0–20)

## 2021-12-24 LAB — C-REACTIVE PROTEIN: CRP: <1 mg/dL (ref 0.5–20.0)

## 2021-12-24 LAB — VITAMIN B12: Vitamin B-12: 734 pg/mL (ref 211–911)

## 2021-12-24 MED ORDER — PROMETHAZINE HCL 12.5 MG PO TABS: 12.5000 mg | ORAL_TABLET | Freq: Two times a day (BID) | ORAL | 0 refills | Status: DC | PRN

## 2021-12-24 MED ORDER — METHYLPREDNISOLONE 4 MG PO TBPK: ORAL_TABLET | ORAL | 0 refills | Status: DC

## 2021-12-24 MED ORDER — SUMATRIPTAN SUCCINATE 50 MG PO TABS: ORAL_TABLET | ORAL | 1 refills | Status: DC

## 2021-12-24 MED ORDER — TOPIRAMATE 25 MG PO TABS: ORAL_TABLET | ORAL | 2 refills | Status: DC

## 2021-12-24 NOTE — Progress Notes (Signed)
Patient ID: Kelsey Swanson, female    DOB: [birth year]  MRN: [id number]  CC: Headache and Dizziness   Subjective: Kelsey Swanson is a 51 y.o. female who presents for headaches Her concerns today include:  Patient with history of EtOH abuse,  Fatty liver, HL, CTS and alcoholic pancreatitis.  Patient presents today with complaints of headache and dizziness x4 months. Seen in the emergency room 12/04/2021 for the same.  Blood pressure was good.  CT head negative.  UA suggestive of UTI so patient given a course of Keflex.  Platelet count found to be low at 45. -HA all over head, very strong; over past 2 mths, she has been stuck in bed because of it.  "Feel the pain is very deep inside.  Makes me want to cut my head off." -assoc with nausea, dizziness, pain with movement of eyes. Endorses blurred vision.  Little photophobia, phenophobia. No warning symptoms -comes on suddenly and also wakes up with HA most times.  HA can last for days. -not sure of anything that makes it worse -feels better with laying down.  He puts ice on head to try to calm it down -takes  a combined Tylenol 500 mg/ Motrin 500 mg 2 tabs BID.  Helps only little -Lost 10 lbs in the past mth unintentionally; attributes this to dec appetite and nausea from HA.  Endorses polydipsia. Fhx of DM in parents and grand-parent.  Hx of preDM.  Patient requested to have liver test done.  She has history of fatty liver  Low PLT:  level 45 from ER.  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     naproxen (NAPROSYN) 500 MG tablet Take 1 tablet (500 mg total) by mouth 2 (two) times daily with a meal. 30 tablet 0   fluconazole (DIFLUCAN) 150 MG tablet Take 1 tablet (150 mg total) by mouth daily. (Patient not taking: Reported on 12/24/2021) 1 tablet 1   omeprazole (PRILOSEC) 20 MG capsule Take 1 capsule (20 mg total) by mouth daily. (Patient not taking: Reported on 12/24/2021) 30 capsule 3   No current facility-administered medications on file prior to visit.    Allergies  Allergen Reactions   Doxycycline Shortness Of Breath and Swelling    Facial swelling also caused blisters in her mouth   Metronidazole Shortness Of Breath and Swelling    Facial swelling and blisters in her mouth    Social History   Socioeconomic History   Marital status: Married    Spouse name: Not on file   Number of children: Not on file   Years of education: Not on file  Highest education level: 6th grade  Occupational History   Occupation: unemployed  Tobacco Use   Smoking status: Former    Packs/day: 0.30    Years: 10.00    Total pack years: 3.00    Types: Cigarettes    Quit date: 06/07/2015    Years since quitting: 6.5   Smokeless tobacco: Never  Vaping Use   Vaping Use: Never used  Substance and Sexual Activity   Alcohol use: Not Currently    Comment:  May 2020   Drug use: No   Sexual activity: Yes    Partners: Male    Birth control/protection: Surgical  Other Topics Concern   Not on file  Social History Narrative   Not on file   Social Determinants of Health    Financial Resource Strain: Not on file  Food Insecurity: Not on file  Transportation Needs: No Transportation Needs (03/29/2019)   PRAPARE - Hydrologist (Medical): No    Lack of Transportation (Non-Medical): No  Physical Activity: Not on file  Stress: Not on file  Social Connections: Not on file  Intimate Partner Violence: Not on file    Family History  Problem Relation Age of Onset   Diabetes Mother    Diabetes Father    Diabetes Sister    Diabetes Maternal Aunt    Ovarian cancer Maternal Aunt    Diabetes Maternal Uncle    Diabetes Maternal Grandmother    Breast cancer Neg Hx    Colon cancer Neg Hx    Rectal cancer Neg Hx    Stomach cancer Neg Hx     Past Surgical History:  Procedure Laterality Date   BREAST EXCISIONAL BIOPSY Right    BREAST SURGERY     right breast   COLONOSCOPY     ECTOPIC PREGNANCY SURGERY  2012   TUBAL LIGATION Bilateral 03/14/2019   Procedure: POST PARTUM TUBAL LIGATION;  Surgeon: Caren Macadam, MD;  Location: [facility];  Service: Gynecology;  Laterality: Bilateral;    ROS: Review of Systems Negative except as stated above  PHYSICAL EXAM: BP 105/70   Pulse 70   Temp 98.1 F (36.7 C) (Temporal)   Ht '5\' 3"'$  (1.6 m)   Wt 191 lb 12.8 oz (87 kg)   SpO2 99%   BMI 33.98 kg/m   Wt Readings from Last 3 Encounters:  12/24/21 191 lb 12.8 oz (87 kg)  12/03/21 194 lb (88 kg)  05/19/21 201 lb 12.8 oz (91.5 kg)    Physical Exam  General appearance - alert, well appearing, middle age Hispanic female, obese and in no distress Mental status - normal mood, behavior, speech, dress, motor activity, and thought processes Mouth - mucous membranes moist, pharynx normal without lesions Neck - supple, no significant adenopathy Chest - clear to auscultation, no wheezes, rales or rhonchi, symmetric air entry Heart - normal rate, regular rhythm, normal S1, S2, no murmurs, rubs, clicks or gallops Neurological - cranial  nerves II through XII intact, motor and sensory grossly normal bilaterally.  Gait is normal. Extremities - peripheral pulses normal, no pedal edema, no clubbing or cyanosis      Latest Ref Rng & Units 12/03/2021    5:35 PM 11/20/2020    3:15 PM 09/18/2020    2:08 PM  CMP  Glucose 70 - 99 mg/dL 112   76   BUN 6 - 20 mg/dL 8   11   Creatinine 0.44 - 1.00 mg/dL 0.46  0.53   Sodium 135 - 145 mmol/L 140   138   Potassium 3.5 - 5.1 mmol/L 4.1   4.1   Chloride 98 - 111 mmol/L 105   101   CO2 22 - 32 mmol/L 26   24   Calcium 8.9 - 10.3 mg/dL 8.9   9.8   Total Protein 6.0 - 8.5 g/dL  7.7  7.7   Total Bilirubin 0.0 - 1.2 mg/dL  0.3  0.3   Alkaline Phos 44 - 121 IU/L  70  148   AST 0 - 40 IU/L  15  88   ALT 0 - 32 IU/L  31  127    Lipid Panel     Component Value Date/Time   CHOL 184 09/18/2020 1408   TRIG 137 09/18/2020 1408   HDL 32 (L) 09/18/2020 1408   CHOLHDL 5.8 (H) 09/18/2020 1408   CHOLHDL 4.4 09/02/2017 1132   VLDL 27 09/02/2017 1132   LDLCALC 127 (H) 09/18/2020 1408    CBC    Component Value Date/Time   WBC 7.0 12/03/2021 1735   RBC 4.34 12/03/2021 1735   HGB 13.0 12/03/2021 1735   HGB 12.5 09/18/2020 1408   HCT 37.5 12/03/2021 1735   HCT 37.0 09/18/2020 1408   PLT 45 (L) 12/03/2021 1735   PLT 309 09/18/2020 1408   MCV 86.4 12/03/2021 1735   MCV 90 09/18/2020 1408   MCH 30.0 12/03/2021 1735   MCHC 34.7 12/03/2021 1735   RDW 12.6 12/03/2021 1735   RDW 13.1 09/18/2020 1408   LYMPHSABS 2.3 09/19/2018 1409   MONOABS 1.3 (H) 04/11/2017 0351   EOSABS 0.1 09/19/2018 1409   BASOSABS 0.0 09/19/2018 1409    ASSESSMENT AND PLAN:  1. Chronic migraine w/o aura w/o status migrainosus, not intractable History suggest chronic migraine.  She has an appointment with neurology later today. -Discussed the importance of healthy eating habits, regular exercise and getting in at least 7 to 8 hours of sleep at night. -Start Topamax for prophylaxis. Imitrex given for abortive  therapy and Phenergan to use as needed for nausea/vomiting.  Patient preferred oral antiemetic rather than rectal suppository. I printed these prescriptions and told her to hold off on filling them until she sees the neurologist in case the neurologist decides to use something different. - topiramate (TOPAMAX) 25 MG tablet; 1 tab PO QHS x 1 wk then 2 tabs PO QHS  Dispense: 60 tablet; Refill: 2 - SUMAtriptan (IMITREX) 50 MG tablet; 1 tab PO at start of headache.  May repeat in 2 hrs if no relief.  Max 2 tabs/24 hrs  Dispense: 10 tablet; Refill: 1 - promethazine (PHENERGAN) 12.5 MG tablet; Take 1 tablet (12.5 mg total) by mouth 2 (two) times daily as needed for nausea or vomiting.  Dispense: 20 tablet; Refill: 0  2. Thrombocytopenia (HCC) Recheck CBC today - Immature Platelet Fraction - CBC  3. Unintended weight loss Sounds related to decreased appetite because of chronic headache.  However we will check thyroid level today - TSH+T4F+T3Free  4. History of fatty infiltration of liver We will check liver panel since its been a while but she has had LFTs done - Hepatic Function Panel  5. Prediabetes Patient advised to eliminate sugary drinks from the diet, cut back on portion sizes especially of white carbohydrates, eat more white lean meat like chicken [place] and seafood instead of beef or pork and incorporate fresh fruits and vegetables into the diet daily. Encouraged regular exercise - Hemoglobin  A1c  6. Need for immunization against influenza - Flu Vaccine QUAD 72moIM (Fluarix, Fluzone & Alfiuria Quad PF)   AMN Language interpreter used during this encounter. # [phone] Patient was given the opportunity to ask questions.  Patient verbalized understanding of the plan and was able to repeat key elements of the plan.   This documentation was completed using DRadio producer  Any transcriptional errors are unintentional.  Orders Placed This Encounter  Procedures    Flu Vaccine QUAD 65moM (Fluarix, Fluzone & Alfiuria Quad PF)   Immature Platelet Fraction   CBC   Hepatic Function Panel   TSH+T4F+T3Free   Hemoglobin A1c     Requested Prescriptions   Signed Prescriptions Disp Refills   topiramate (TOPAMAX) 25 MG tablet 60 tablet 2    Sig: 1 tab PO QHS x 1 wk then 2 tabs PO QHS   SUMAtriptan (IMITREX) 50 MG tablet 10 tablet 1    Sig: 1 tab PO at start of headache.  May repeat in 2 hrs if no relief.  Max 2 tabs/24 hrs   promethazine (PHENERGAN) 12.5 MG tablet 20 tablet 0    Sig: Take 1 tablet (12.5 mg total) by mouth 2 (two) times daily as needed for nausea or vomiting.    Return in about 8 weeks (around 02/18/2022).  DeKarle PlumberMD, FACP

## 2021-12-24 NOTE — Patient Instructions (Addendum)
I saw you today for headaches.  I would like to do a few more lab tests.  I would also like to get an MRI of your brain and blood vessels in your brain. You will get a call to schedule this. If you do not hear from anyone in 1-2 weeks, please call us and let us know.  For you headache: -Steroid burst pack (Medrol dose pack) to try to break the current headache.  -Take 6 pills on day 1, 5 pills on day 2, 4 pills on day 3, 3 pills on day 4, 2 pills on day  5, 1 pill on day 6, then stop  I agree with the medications prescribed this morning by your primary care doctor. These are good options to start with:             -Sumatriptan 50 mg at the start of a headache  -For headache prevention, take this medication every day whether you have a headache or not: Topamax 25 mg for 1 week then increase to 50 mg thereafter  -Limit use of pain relievers to no more than 2 days out of week to prevent risk of rebound or medication-overuse headache.  -Keep headache diary  -Follow up in 2 months in clinic  Contact me with any problems with your medications or new or worsening symptoms.  The physicians and staff at [facility] are committed to providing excellent care. You may receive a survey requesting feedback about your experience at our office. We strive to receive "very good" responses to the survey questions. If you feel that your experience would prevent you from giving the office a "very good " response, please contact our office to try to remedy the situation. We may be reached at [phone]. Thank you for taking the time out of your busy day to complete the survey.  Kai Levins, MD [facility]

## 2021-12-24 NOTE — Patient Instructions (Signed)
Cefalea migraosa Migraine Headache Una cefalea migraosa es un dolor intenso y punzante en uno o ambos lados de la cabeza. Las cefaleas migraosas tambin pueden causar otros sntomas, como nuseas, vmitos y sensibilidad a la luz y el ruido. Una cefalea migraosa puede durar desde 4 horas [employer]. Hable con su mdico [employer] factores que pueden causar Medical illustrator) las Psychologist, occupational. Cules son las causas? Se desconoce la causa exacta de esta afeccin. Sin embargo, una migraa puede aparecer [employer] nervios del cerebro se irritan y liberan ciertas sustancias qumicas que causan inflamacin de los vasos sanguneos. Esta inflamacin provoca dolor. Esta afeccin puede desencadenarse o ser causada por lo siguiente: Consumo de alcohol. Consumo de cigarrillos. Tomar medicamentos como por ejemplo: Medicamentos para Air cabin crew (nitroglicerina). Anticonceptivos orales. Estrgeno. Ciertos medicamentos para la presin arterial. [place] o beber productos que contienen nitratos, glutamato, aspartamo o tiramina. Los quesos [place], el chocolate o la cafena tambin pueden ser desencadenantes. Hacer actividad fsica. Otros factores que pueden provocar cefalea migraosa son los siguientes: Menstruacin. Embarazo. Hambre. Estrs. Dormir poco o Aeronautical engineer. Cambios climticos. Fatiga. Qu incrementa el riesgo? Los siguientes factores pueden hacer que usted sea ms propenso a Best boy migraas: Metallurgist edad. Es ms probable que Personnel officer se manifieste en personas que tienen entre 25 y 15 aos. Ser mujer. Tener antecedentes familiares de cefalea migraosa. Ser de Science writer. Tener una afeccin de salud mental, como depresin o ansiedad. Ser obeso. Cules son los signos o los sntomas? El principal sntoma de esta afeccin es el dolor intenso y punzante. Este dolor puede [employer] siguientes caractersticas: Puede aparecer en cualquier regin de la  cabeza, tanto de un lado como de [place]. Puede interferir con las actividades de la vida cotidiana. Puede empeorar con la actividad fsica. Puede empeorar ante la exposicin a luces brillantes o a ruidos fuertes. Otros sntomas pueden incluir: Nuseas. Vmitos. Mareos. Sensibilidad general a las luces brillantes, a los ruidos fuertes o a los [employer]. Antes de tener una cefalea migraosa, puede recibir seales de advertencia (aura). Un aura puede incluir: Ver luces intermitentes o tener puntos ciegos. Ver puntos brillantes, halos o lneas en zigzag. Tener una visin en tnel o visin borrosa. Sentir entumecimiento u hormigueo. Tener dificultad para hablar. Debilidad muscular. Algunas personas tienen sntomas despus de una cefalea migraosa (fase posdromal), como los siguientes: Cansancio. Dificultad para concentrarte. Cmo se diagnostica? La cefalea migraosa se diagnostica en funcin de lo siguiente: Sus sntomas. Un examen fsico. Pruebas, como, por ejemplo: Tomografa computarizada (TC) o resonancia magntica (RM) de la cabeza. Estos estudios de diagnstico por imgenes pueden ayudar a Freight forwarder causas de cefalea. Tomar una muestra de lquido de la mdula espinal (puncin lumbar) para Physiological scientist (anlisis de lquido cefalorraqudeo o anlisis de LCR). Cmo se trata? Esta afeccin se puede tratar con medicamentos para: Best boy. [place]. El tratamiento de esta afeccin tambin puede incluir lo siguiente: Acupuntura. Cambios en el estilo de vida, como evitar los alimentos que provocan las cefaleas migraosas. Biorretroalimentacin. Terapia cognitiva conductual. Siga estas instrucciones en su casa: Medicamentos Use los medicamentos de venta libre y los recetados solamente como se lo haya indicado el mdico. Pregntele al mdico si el medicamento recetado: Hace que sea necesario que evite conducir o usar maquinaria  pesada. Puede causarle estreimiento. Es posible que tenga que tomar estas medidas para prevenir o tratar el estreimiento: Electronics engineer suficiente lquido como para Theatre manager la orina de color amarillo plido. Tomar  medicamentos recetados o de [employer]. Consumir alimentos ricos en fibra, como frijoles, cereales integrales, y frutas y verduras frescas. Limitar el consumo de alimentos ricos en grasa y azcares procesados, como los alimentos fritos o dulces. Estilo de vida No beba alcohol. No consuma ningn producto que contenga nicotina o tabaco, como cigarrillos, cigarrillos electrnicos y tabaco de Higher education careers adviser. Si necesita ayuda para dejar de fumar, consulte al mdico. Duerma como mnimo 8 horas todas las noches. Encuentre modos de [employer], por ejemplo, a travs de la meditacin, la respiracin profunda o el yoga. Indicaciones generales     Lleve un registro diario para Neurosurgeon lo que Financial trader. Registre, por ejemplo, lo siguiente: Lo que usted come y bebe. El tiempo que duerme. Algn cambio en su dieta o en los medicamentos. Si tiene una cefalea migraosa: Evite los factores que [employer] sntomas, como las luces brillantes. Resulta til acostarse en una habitacin oscura y silenciosa. No conduzca vehculos ni opere maquinaria pesada. Pregntele al mdico qu actividades son seguras para usted cuando tiene sntomas. Concurra a todas las visitas de seguimiento como se lo haya indicado el mdico. Esto es importante. Comunquese con un mdico si: Tiene sntomas de cefalea migraosa que son distintos o ms intensos que los habituales. Tiene ms de [address] de cefalea por mes. Solicite ayuda inmediatamente si: La cefalea migraosa se vuelve cada vez ms intensa. La cefalea migraosa dura ms de 72 horas. Tiene fiebre. Presenta rigidez en el cuello. Presenta prdida de la visin. Siente debilidad en los msculos o que no puede controlarlos. Comienza a  perder el equilibrio con frecuencia. Presenta dificultad para caminar. Se desmaya. Tiene una convulsin. Resumen [place] cefalea migraosa es un dolor intenso y punzante en uno o ambos lados de la cabeza. Las migraas tambin pueden causar otros sntomas, como nuseas, vmitos y sensibilidad a la luz y el ruido. Esta afeccin puede tratarse con medicamentos y cambios en el estilo de vida. Tambin es posible que deba evitar ciertos factores que desencadenan una cefalea migraosa. Lleve un registro diario para Neurosurgeon lo que Financial trader. Comunquese con el mdico si tiene ms de 15 das de cefalea en un mes o presenta sntomas de cefalea migraosa que son distintos o ms intensos que los habituales. Esta informacin no tiene Marine scientist el consejo del mdico. Asegrese de hacerle al mdico cualquier pregunta que tenga. Document Revised: 04/14/2018 Document Reviewed: 04/14/2018 Elsevier Patient Education  [place].

## 2021-12-25 ENCOUNTER — Other Ambulatory Visit: Payer: Self-pay | Admitting: Internal Medicine

## 2021-12-25 DIAGNOSIS — D696 Thrombocytopenia, unspecified: Secondary | ICD-10-CM

## 2021-12-25 LAB — HEPATIC FUNCTION PANEL
ALT: 57 IU/L — ABNORMAL HIGH (ref 0–32)
AST: 27 IU/L (ref 0–40)
Albumin: 4.4 g/dL (ref 3.9–4.9)
Alkaline Phosphatase: 149 IU/L — ABNORMAL HIGH (ref 44–121)
Bilirubin Total: 0.3 mg/dL (ref 0.0–1.2)
Bilirubin, Direct: <0.1 mg/dL (ref 0.00–0.40)
Total Protein: 6.8 g/dL (ref 6.0–8.5)

## 2021-12-25 LAB — CBC
Hematocrit: 37.4 % (ref 34.0–46.6)
Hemoglobin: 12.7 g/dL (ref 11.1–15.9)
MCH: 29.3 pg (ref 26.6–33.0)
MCHC: 34 g/dL (ref 31.5–35.7)
MCV: 86 fL (ref 79–97)
Platelets: 44 10*3/uL — CL (ref 150–450)
RBC: 4.34 x10E6/uL (ref 3.77–5.28)
RDW: 12.7 % (ref 11.7–15.4)
WBC: 7.5 10*3/uL (ref 3.4–10.8)

## 2021-12-25 LAB — TSH+T4F+T3FREE
Free T4: 1.08 ng/dL (ref 0.82–1.77)
T3, Free: 3.6 pg/mL (ref 2.0–4.4)
TSH: 2.12 u[IU]/mL (ref 0.450–4.500)

## 2021-12-25 LAB — HEMOGLOBIN A1C
Est. average glucose Bld gHb Est-mCnc: 120 mg/dL
Hgb A1c MFr Bld: 5.8 % — ABNORMAL HIGH (ref 4.8–5.6)

## 2021-12-28 ENCOUNTER — Telehealth: Payer: Self-pay | Admitting: Hematology and Oncology

## 2021-12-28 NOTE — Telephone Encounter (Signed)
Scheduled appointment per 11/06 referral . Patient is aware of appointment date and time. Patient is aware to arrive 15 mins prior to appointment time and to bring updated insurance cards. Patient is aware of location.

## 2021-12-29 ENCOUNTER — Other Ambulatory Visit: Payer: Self-pay | Admitting: Internal Medicine

## 2021-12-29 ENCOUNTER — Ambulatory Visit: Payer: Medicaid Other | Attending: Internal Medicine

## 2021-12-29 DIAGNOSIS — R7989 Other specified abnormal findings of blood chemistry: Secondary | ICD-10-CM

## 2021-12-30 LAB — HEPATITIS B SURFACE ANTIGEN: Hepatitis B Surface Ag: NEGATIVE

## 2021-12-30 LAB — GAMMA GT: GGT: 56 IU/L (ref 0–60)

## 2021-12-30 LAB — HEPATITIS B SURFACE ANTIBODY, QUANTITATIVE: Hepatitis B Surf Ab Quant: 163.5 m[IU]/mL (ref >9.9)

## 2021-12-30 LAB — HEPATITIS C ANTIBODY: Hep C Virus Ab: NONREACTIVE

## 2022-01-05 ENCOUNTER — Encounter: Payer: Self-pay | Admitting: Neurology

## 2022-01-06 ENCOUNTER — Encounter: Payer: Self-pay | Admitting: Neurology

## 2022-01-10 ENCOUNTER — Ambulatory Visit
Admission: RE | Admit: 2022-01-10 | Discharge: 2022-01-10 | Disposition: A | Discharge status: Home / Self Care | Payer: Medicaid Other | Source: Ambulatory Visit | Attending: Neurology | Admitting: Neurology

## 2022-01-10 DIAGNOSIS — F40298 Other specified phobia: Secondary | ICD-10-CM

## 2022-01-10 DIAGNOSIS — G4452 New daily persistent headache (NDPH): Secondary | ICD-10-CM

## 2022-01-10 DIAGNOSIS — G43911 Migraine, unspecified, intractable, with status migrainosus: Secondary | ICD-10-CM

## 2022-01-10 DIAGNOSIS — R112 Nausea with vomiting, unspecified: Secondary | ICD-10-CM

## 2022-01-10 DIAGNOSIS — R519 Headache, unspecified: Secondary | ICD-10-CM | POA: Diagnosis not present

## 2022-01-10 DIAGNOSIS — R6884 Jaw pain: Secondary | ICD-10-CM

## 2022-01-10 MED ORDER — GADOPICLENOL 0.5 MMOL/ML IV SOLN
10.0000 mL | Freq: Once | INTRAVENOUS | Status: AC | PRN
  Administered 2022-01-10: 10 mL via INTRAVENOUS

## 2022-01-11 ENCOUNTER — Other Ambulatory Visit: Payer: Self-pay

## 2022-01-11 ENCOUNTER — Inpatient Hospital Stay: Payer: Medicaid Other | Attending: Hematology and Oncology

## 2022-01-11 ENCOUNTER — Inpatient Hospital Stay (HOSPITAL_BASED_OUTPATIENT_CLINIC_OR_DEPARTMENT_OTHER): Payer: Medicaid Other | Admitting: Hematology and Oncology

## 2022-01-11 VITALS — BP 120/77 | HR 75 | Temp 97.9°F | Resp 18 | Ht 63.0 in | Wt 185.8 lb

## 2022-01-11 DIAGNOSIS — D696 Thrombocytopenia, unspecified: Secondary | ICD-10-CM | POA: Diagnosis not present

## 2022-01-11 DIAGNOSIS — K760 Fatty (change of) liver, not elsewhere classified: Secondary | ICD-10-CM | POA: Insufficient documentation

## 2022-01-11 DIAGNOSIS — K859 Acute pancreatitis without necrosis or infection, unspecified: Secondary | ICD-10-CM | POA: Diagnosis not present

## 2022-01-11 DIAGNOSIS — R7989 Other specified abnormal findings of blood chemistry: Secondary | ICD-10-CM | POA: Diagnosis not present

## 2022-01-11 DIAGNOSIS — Z87891 Personal history of nicotine dependence: Secondary | ICD-10-CM | POA: Insufficient documentation

## 2022-01-11 DIAGNOSIS — R634 Abnormal weight loss: Secondary | ICD-10-CM | POA: Diagnosis not present

## 2022-01-11 LAB — CBC WITH DIFFERENTIAL (CANCER CENTER ONLY)
Abs Immature Granulocytes: 0.02 10*3/uL (ref 0.00–0.07)
Basophils Absolute: 0 10*3/uL (ref 0.0–0.1)
Basophils Relative: 1 %
Eosinophils Absolute: 0.1 10*3/uL (ref 0.0–0.5)
Eosinophils Relative: 2 %
HCT: 38.6 % (ref 36.0–46.0)
Hemoglobin: 12.9 g/dL (ref 12.0–15.0)
Immature Granulocytes: 0 %
Lymphocytes Relative: 36 %
Lymphs Abs: 2.2 10*3/uL (ref 0.7–4.0)
MCH: 28.3 pg (ref 26.0–34.0)
MCHC: 33.4 g/dL (ref 30.0–36.0)
MCV: 84.6 fL (ref 80.0–100.0)
Monocytes Absolute: 0.5 10*3/uL (ref 0.1–1.0)
Monocytes Relative: 8 %
Neutro Abs: 3.4 10*3/uL (ref 1.7–7.7)
Neutrophils Relative %: 53 %
Platelet Count: 78 10*3/uL — ABNORMAL LOW (ref 150–400)
RBC: 4.56 MIL/uL (ref 3.87–5.11)
RDW: 12.4 % (ref 11.5–15.5)
Smear Review: NORMAL
WBC Count: 6.2 10*3/uL (ref 4.0–10.5)
nRBC: 0 % (ref 0.0–0.2)

## 2022-01-11 LAB — ABO/RH: ABO/RH(D): O POS

## 2022-01-11 NOTE — Assessment & Plan Note (Signed)
The patient had history of alcoholic hepatitis She has borderline abnormal liver enzymes I think it is reasonable to repeat imaging study of her liver to reassess to make sure she has not developed signs of liver cirrhosis

## 2022-01-11 NOTE — Assessment & Plan Note (Signed)
Patient is known to have history of alcoholic pancreatitis with recurrent admission in the past Recent work-up reviewed borderline abnormal liver enzymes but no other identifiable causes Her B12 level, hepatitis panel and others were unremarkable I think it is reasonable to repeat imaging study since it has been a few years since her last CT imaging If the patient has developed signs of chronic pancreatitis or other acute liver abnormalities, it could be a potential source of her thrombocytopenia However, if normal, she can benefit from bone marrow aspirate and biopsy to rule out ITP

## 2022-01-11 NOTE — Progress Notes (Unsigned)
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 Recent work-up reviewed borderline abnormal liver enzymes but no other identifiable causes Her B12 level, hepatitis panel and others were unremarkable I think it is reasonable to repeat imaging study since it has been a few years since her last CT imaging If the patient has developed signs of chronic pancreatitis or other acute liver abnormalities, it could be a potential source of her thrombocytopenia However, if normal, she can benefit from bone marrow aspirate and biopsy to rule out ITP  Abnormal liver function tests The patient had history of alcoholic hepatitis She has borderline abnormal liver enzymes I think it is reasonable to repeat imaging study of her liver to reassess to make sure she has not developed signs of liver cirrhosis  Orders Placed This Encounter  Procedures   CT ABDOMEN PELVIS W CONTRAST    Standing Status:   Future    Standing Expiration Date:   01/12/2023    Order Specific Question:   If indicated for the ordered procedure, I authorize the administration of contrast media per Radiology protocol    Answer:   Yes    Order Specific Question:   Preferred imaging location?    Answer:   [facility]    Order Specific Question:   Radiology Contrast Protocol - do NOT remove file path    Answer:   [email or web address]    Order Specific Question:   Is patient pregnant?    Answer:   No   CBC with Differential (Cancer Center Only)    Standing Status:   Future    Number of Occurrences:   1    Standing Expiration Date:   01/12/2023   ANA, IFA (with reflex)    Standing Status:   Future    Number of Occurrences:   1    Standing Expiration Date:   01/11/2023   ABO/Rh     Standing Status:   Future    Number of Occurrences:   1    Standing Expiration Date:   01/12/2023   All questions were answered. The patient knows to call the clinic with any problems, questions or concerns. No barriers to learning was detected. The total time spent in the appointment was 60 minutes encounter with patients including review of chart and various tests results, discussions about plan of care and coordination of care plan  Heath Lark, MD 01/12/2022 11:04 AM  CHIEF COMPLAINTS/PURPOSE OF CONSULTATION:  Acute thrombocytopenia  HISTORY OF PRESENTING ILLNESS:  Kelsey Swanson 51 y.o. female is here because of thrombocytopenia.  She was found to have abnormal CBC from thrombocytopenia with repeat blood work when she presented to the emergency department I have the opportunity to review her CBC dated back to 2011 On 06/04/2009, platelet count was 212 On 04/06/2017, her platelet count was 85 On 04/20/2017, her platelet count was 786 On December 03, 2021, her platelet count was 45 On December 24, 2021, her platelet count was 44  Over the past few years, the patient had recurrent admission to the hospital due to alcoholic pancreatitis This was confirmed on imaging studies In 2018, CT imaging show evidence of pancreatitis and severe hepatic steatosis On Jul 01, 2018, MRI showed evidence of pancreatitis as well as hepatic steatosis She denies alcohol use for the  last 3 years  She denies recent bruising/bleeding, such as spontaneous epistaxis, hematuria, melena or hematochezia She has recent excessive menorrhagia.  Her menstrual cycle has become somewhat irregular with passage of large amount of clots.  Last month, she had menstrual cycle twice that lasted for 8 days She had recent migraine headaches with vomiting.  She have lost about 20 pounds over the past few months due to nausea The patient denies history of liver disease, exposure to heparin, history of cardiac murmur/prior  cardiovascular surgery or recent new medications She denies prior blood or platelet transfusions Her primary care doctor has recently order an extensive panel including hepatitis panel, B12 and thyroid function test.  All the results came back unremarkable except for borderline elevated liver enzymes-  MEDICAL HISTORY:  Past Medical History:  Diagnosis Date   Abnormal Pap smear 1996   Fatty liver    Headache    Pancreatitis    Pancreatitis, chronic (HCC)    Thrombocytopenia ([place])    Vaginal Pap smear, abnormal     SURGICAL HISTORY: Past Surgical History:  Procedure Laterality Date   BREAST EXCISIONAL BIOPSY Right    BREAST SURGERY     right breast   COLONOSCOPY     ECTOPIC PREGNANCY SURGERY  2012   TUBAL LIGATION Bilateral 03/14/2019   Procedure: POST PARTUM TUBAL LIGATION;  Surgeon: Caren Macadam, MD;  Location: [facility] LD ORS;  Service: Gynecology;  Laterality: Bilateral;    SOCIAL HISTORY: Social History   Socioeconomic History   Marital status: Married    Spouse name: Not on file   Number of children: Not on file   Years of education: Not on file   Highest education level: 6th grade  Occupational History   Occupation: unemployed  Tobacco Use   Smoking status: Former    Packs/day: 0.30    Years: 10.00    Total pack years: 3.00    Types: Cigarettes    Quit date: 06/07/2015    Years since quitting: 6.6   Smokeless tobacco: Never  Vaping Use   Vaping Use: Never used  Substance and Sexual Activity   Alcohol use: Not Currently    Comment:  May 2020   Drug use: No   Sexual activity: Yes    Partners: Male    Birth control/protection: Surgical  Other Topics Concern   Not on file  Social History Narrative   Are you right handed or left handed? Right   Are you currently employed ? no   Caffeine occas.   Do you live at home alone?no   Who lives with you? Husband and 2 kids   What type of home do you live in: 1 story or 2 story? Two story       Social  Determinants of Radio broadcast assistant Strain: Not on file  Food Insecurity: Not on file  Transportation Needs: No Transportation Needs (03/29/2019)   PRAPARE - Hydrologist (Medical): No    Lack of Transportation (Non-Medical): No  Physical Activity: Not on file  Stress: Not on file  Social Connections: Not on file  Intimate Partner Violence: Not on file    FAMILY HISTORY: Family History  Problem Relation Age of Onset   Diabetes Mother    Diabetes Father    Diabetes Sister    Diabetes Maternal Aunt    Ovarian cancer Maternal Aunt    Diabetes Maternal Uncle    Diabetes Maternal Grandmother  Breast cancer Neg Hx    Colon cancer Neg Hx    Rectal cancer Neg Hx    Stomach cancer Neg Hx     ALLERGIES:  is allergic to doxycycline and metronidazole.  MEDICATIONS:  Current Outpatient Medications  Medication Sig Dispense Refill   Ascorbic Acid (VITAMIN C) 1000 MG tablet Take 1,000 mg by mouth daily.     naproxen (NAPROSYN) 500 MG tablet Take 1 tablet (500 mg total) by mouth 2 (two) times daily with a meal. 30 tablet 0   omeprazole (PRILOSEC) 20 MG capsule Take 1 capsule (20 mg total) by mouth daily. (Patient not taking: Reported on 12/24/2021) 30 capsule 3   SUMAtriptan (IMITREX) 50 MG tablet 1 tab PO at start of headache.  May repeat in 2 hrs if no relief.  Max 2 tabs/24 hrs 10 tablet 1   topiramate (TOPAMAX) 25 MG tablet 1 tab PO QHS x 1 wk then 2 tabs PO QHS 60 tablet 2   No current facility-administered medications for this visit.    REVIEW OF SYSTEMS: She had recent constipation  constitutional: Denies fevers, chills or abnormal night sweats Eyes: Denies blurriness of vision, double vision or watery eyes Ears, nose, mouth, throat, and face: Denies mucositis or sore throat Respiratory: Denies cough, dyspnea or wheezes Cardiovascular: Denies palpitation, chest discomfort or lower extremity swelling Skin: Denies abnormal skin  rashes Lymphatics: Denies new lymphadenopathy or easy bruising Neurological:Denies numbness, tingling or new weaknesses Behavioral/Psych: Mood is stable, no new changes  All other systems were reviewed with the patient and are negative.  PHYSICAL EXAMINATION: ECOG PERFORMANCE STATUS: 1 - Symptomatic but completely ambulatory  Vitals:   01/11/22 1253  BP: 120/77  Pulse: 75  Resp: 18  Temp: 97.9 F (36.6 C)  SpO2: 100%   Filed Weights   01/11/22 1253  Weight: 185 lb 12.8 oz (84.3 kg)    GENERAL:alert, no distress and comfortable SKIN: skin color, texture, turgor are normal, no rashes or significant lesions EYES: normal, conjunctiva are pink and non-injected, sclera clear OROPHARYNX:no exudate, no erythema and lips, buccal mucosa, and tongue normal  NECK: supple, thyroid normal size, non-tender, without nodularity LYMPH:  no palpable lymphadenopathy in the cervical, axillary or inguinal LUNGS: clear to auscultation and percussion with normal breathing effort HEART: regular rate & rhythm and no murmurs and no lower extremity edema ABDOMEN:abdomen soft, palpable fullness in her epigastrium with discomfort on deep palpation but without rebound or guarding Musculoskeletal:no cyanosis of digits and no clubbing  PSYCH: alert & oriented x 3 with fluent speech NEURO: no focal motor/sensory deficits  LABORATORY DATA:  I have reviewed the data as listed Lab Results  Component Value Date   WBC 6.2 01/11/2022   HGB 12.9 01/11/2022   HCT 38.6 01/11/2022   MCV 84.6 01/11/2022   PLT 78 (L) 01/11/2022

## 2022-01-12 ENCOUNTER — Encounter: Payer: Self-pay | Admitting: Hematology and Oncology

## 2022-01-13 ENCOUNTER — Telehealth: Payer: Self-pay | Admitting: Hematology and Oncology

## 2022-01-13 LAB — ANTINUCLEAR ANTIBODIES, IFA: ANA Ab, IFA: NEGATIVE

## 2022-01-18 ENCOUNTER — Ambulatory Visit (HOSPITAL_COMMUNITY)
Admission: RE | Admit: 2022-01-18 | Discharge: 2022-01-18 | Disposition: A | Discharge status: Home / Self Care | Payer: Medicaid Other | Source: Ambulatory Visit | Attending: Hematology and Oncology | Admitting: Hematology and Oncology

## 2022-01-18 DIAGNOSIS — K760 Fatty (change of) liver, not elsewhere classified: Secondary | ICD-10-CM | POA: Diagnosis not present

## 2022-01-18 DIAGNOSIS — K859 Acute pancreatitis without necrosis or infection, unspecified: Secondary | ICD-10-CM | POA: Diagnosis not present

## 2022-01-18 LAB — POCT I-STAT CREATININE: Creatinine, Ser: 0.5 mg/dL (ref 0.44–1.00)

## 2022-01-18 MED ORDER — IOHEXOL 300 MG/ML  SOLN
100.0000 mL | Freq: Once | INTRAMUSCULAR | Status: AC | PRN
  Administered 2022-01-18: 100 mL via INTRAVENOUS

## 2022-01-25 ENCOUNTER — Encounter: Payer: Self-pay | Admitting: Hematology and Oncology

## 2022-01-25 ENCOUNTER — Other Ambulatory Visit: Payer: Self-pay

## 2022-01-25 ENCOUNTER — Inpatient Hospital Stay: Payer: Medicaid Other | Attending: Hematology and Oncology | Admitting: Hematology and Oncology

## 2022-01-25 VITALS — BP 113/67 | HR 79 | Temp 97.9°F | Resp 18 | Ht 63.0 in | Wt 186.8 lb

## 2022-01-25 DIAGNOSIS — Z87891 Personal history of nicotine dependence: Secondary | ICD-10-CM | POA: Diagnosis not present

## 2022-01-25 DIAGNOSIS — R7989 Other specified abnormal findings of blood chemistry: Secondary | ICD-10-CM | POA: Diagnosis not present

## 2022-01-25 DIAGNOSIS — K859 Acute pancreatitis without necrosis or infection, unspecified: Secondary | ICD-10-CM | POA: Insufficient documentation

## 2022-01-25 DIAGNOSIS — K760 Fatty (change of) liver, not elsewhere classified: Secondary | ICD-10-CM | POA: Insufficient documentation

## 2022-01-25 DIAGNOSIS — D696 Thrombocytopenia, unspecified: Secondary | ICD-10-CM | POA: Insufficient documentation

## 2022-01-25 NOTE — Assessment & Plan Note (Signed)
I have reviewed CT imaging with the patient She is known to have fatty liver disease, confirmed on CT imaging There is no signs of acute pancreatitis or pseudocyst The patient has quit alcohol use I recommend risk factor modification for now

## 2022-01-25 NOTE — Assessment & Plan Note (Signed)
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

## 2022-01-25 NOTE — Progress Notes (Signed)
[place] OFFICE PROGRESS NOTE  Kelsey Pier, MD  ASSESSMENT & PLAN:  Thrombocytopenia [facility]) Patient is known to have history of alcoholic pancreatitis with recurrent admission in the past Recent work-up reviewed borderline abnormal liver enzymes; CT imaging confirm mild hepatic steatosis Her B12 level, hepatitis panel and others were unremarkable  I suspect the cause of her thrombocytopenia is multifactorial, likely due to liver disease Concurrent ITP cannot be excluded but ITP diagnosis is a diagnosis of exclusion  Overall, she is not symptomatic She does not need treatment for her ITP even if that is the case I recommend close observation I plan to see her again in 3 months for further follow-up  Fatty liver I have reviewed CT imaging with the patient She is known to have fatty liver disease, confirmed on CT imaging There is no signs of acute pancreatitis or pseudocyst The patient has quit alcohol use I recommend risk factor modification for now  Orders Placed This Encounter  Procedures   CMP ([place] only)    Standing Status:   Future    Standing Expiration Date:   01/26/2023   CBC with Differential ([place] Only)    Standing Status:   Future    Standing Expiration Date:   01/26/2023    The total time spent in the appointment was 20 minutes encounter with patients including review of chart and various tests results, discussions about plan of care and coordination of care plan   All questions were answered. The patient knows to call the clinic with any problems, questions or concerns. No barriers to learning was detected.    Heath Lark, MD 12/4/20232:48 PM  INTERVAL HISTORY: Kelsey Swanson 51 y.o. female returns for further follow-up and review of test results She denies recent bleeding  SUMMARY OF HEMATOLOGIC HISTORY: Kelsey Swanson 51 y.o. female is here because of thrombocytopenia.  She was found to have abnormal CBC  from thrombocytopenia with repeat blood work when she presented to the emergency department I have the opportunity to review her CBC dated back to 2011 On 06/04/2009, platelet count was 212 On 04/06/2017, her platelet count was 85 On 04/20/2017, her platelet count was 786 On December 03, 2021, her platelet count was 45 On December 24, 2021, her platelet count was 44  Over the past few years, the patient had recurrent admission to the hospital due to alcoholic pancreatitis This was confirmed on imaging studies In 2018, CT imaging show evidence of pancreatitis and severe hepatic steatosis On Jul 01, 2018, MRI showed evidence of pancreatitis as well as hepatic steatosis She denies alcohol use for the last 3 years  She denies recent bruising/bleeding, such as spontaneous epistaxis, hematuria, melena or hematochezia She has recent excessive menorrhagia.  Her menstrual cycle has become somewhat irregular with passage of large amount of clots.  Last month, she had menstrual cycle twice that lasted for 8 days She had recent migraine headaches with vomiting.  She have lost about 20 pounds over the past few months due to nausea The patient denies history of liver disease, exposure to heparin, history of cardiac murmur/prior cardiovascular surgery or recent new medications She denies prior blood or platelet transfusions Her primary care doctor has recently order an extensive panel including hepatitis panel, B12 and thyroid function test.  All the results came back unremarkable except for borderline elevated liver enzymes- Repeat blood work showed improvement of platelet count.  CT imaging in November showed evidence of hepatic  steatosis  I have reviewed the past medical history, past surgical history, social history and family history with the patient and they are unchanged from previous note.  ALLERGIES:  is allergic to doxycycline and metronidazole.  MEDICATIONS:  Current Outpatient Medications   Medication Sig Dispense Refill   Ascorbic Acid (VITAMIN C) 1000 MG tablet Take 1,000 mg by mouth daily.     naproxen (NAPROSYN) 500 MG tablet Take 1 tablet (500 mg total) by mouth 2 (two) times daily with a meal. 30 tablet 0   omeprazole (PRILOSEC) 20 MG capsule Take 1 capsule (20 mg total) by mouth daily. (Patient not taking: Reported on 12/24/2021) 30 capsule 3   SUMAtriptan (IMITREX) 50 MG tablet 1 tab PO at start of headache.  May repeat in 2 hrs if no relief.  Max 2 tabs/24 hrs 10 tablet 1   topiramate (TOPAMAX) 25 MG tablet 1 tab PO QHS x 1 wk then 2 tabs PO QHS 60 tablet 2   No current facility-administered medications for this visit.     REVIEW OF SYSTEMS:   Constitutional: Denies fevers, chills or night sweats Eyes: Denies blurriness of vision Ears, nose, mouth, throat, and face: Denies mucositis or sore throat Respiratory: Denies cough, dyspnea or wheezes Cardiovascular: Denies palpitation, chest discomfort or lower extremity swelling Gastrointestinal:  Denies nausea, heartburn or change in bowel habits Skin: Denies abnormal skin rashes Lymphatics: Denies new lymphadenopathy or easy bruising Neurological:Denies numbness, tingling or new weaknesses Behavioral/Psych: Mood is stable, no new changes  All other systems were reviewed with the patient and are negative.  PHYSICAL EXAMINATION: ECOG PERFORMANCE STATUS: 0 - Asymptomatic  Vitals:   01/25/22 1203  BP: 113/67  Pulse: 79  Resp: 18  Temp: 97.9 F (36.6 C)  SpO2: 100%   Filed Weights   01/25/22 1203  Weight: 186 lb 12.8 oz (84.7 kg)    GENERAL:alert, no distress and comfortable NEURO: alert & oriented x 3 with fluent speech, no focal motor/sensory deficits  LABORATORY DATA:  I have reviewed the data as listed     Component Value Date/Time   NA 140 12/03/2021 1735   NA 138 09/18/2020 1408   K 4.1 12/03/2021 1735   CL 105 12/03/2021 1735   CO2 26 12/03/2021 1735   GLUCOSE 112 (H) 12/03/2021 1735   BUN 8  12/03/2021 1735   BUN 11 09/18/2020 1408   CREATININE 0.50 01/18/2022 1759   CALCIUM 8.9 12/03/2021 1735   PROT 6.8 12/24/2021 1006   ALBUMIN 4.4 12/24/2021 1006   AST 27 12/24/2021 1006   ALT 57 (H) 12/24/2021 1006   ALKPHOS 149 (H) 12/24/2021 1006   BILITOT 0.3 12/24/2021 1006   GFRNONAA >60 12/03/2021 1735   GFRAA >60 05/18/2019 0520    No results found for: "SPEP", "UPEP"  Lab Results  Component Value Date   WBC 6.2 01/11/2022   NEUTROABS 3.4 01/11/2022   HGB 12.9 01/11/2022   HCT 38.6 01/11/2022   MCV 84.6 01/11/2022   PLT 78 (L) 01/11/2022      Chemistry      Component Value Date/Time   NA 140 12/03/2021 1735   NA 138 09/18/2020 1408   K 4.1 12/03/2021 1735   CL 105 12/03/2021 1735   CO2 26 12/03/2021 1735   BUN 8 12/03/2021 1735   BUN 11 09/18/2020 1408   CREATININE 0.50 01/18/2022 1759      Component Value Date/Time   CALCIUM 8.9 12/03/2021 1735   ALKPHOS 149 (H) 12/24/2021  1006   AST 27 12/24/2021 1006   ALT 57 (H) 12/24/2021 1006   BILITOT 0.3 12/24/2021 1006       RADIOGRAPHIC STUDIES: I have reviewed CT imaging with the patient I have personally reviewed the radiological images as listed and agreed with the findings in the report.

## 2022-01-27 ENCOUNTER — Other Ambulatory Visit: Payer: Self-pay | Admitting: Internal Medicine

## 2022-01-27 DIAGNOSIS — Z1231 Encounter for screening mammogram for malignant neoplasm of breast: Secondary | ICD-10-CM

## 2022-02-03 ENCOUNTER — Ambulatory Visit: Payer: Medicaid Other | Admitting: Neurology

## 2022-02-11 NOTE — Progress Notes (Deleted)
NEUROLOGY FOLLOW UP OFFICE NOTE  Kelsey Swanson [id number]  Subjective:  Kelsey Swanson is a 51 y.o. year old right-handed female with a medical history of EtOH abuse c/b pancreatitis, fatty liver, HLD, depression, H. Pylori infection, and bilateral CTS  who we last saw on 12/24/21.  To briefly review: Patient has had headaches for greater than 4 months. They have been worse over the last 2 months. Her whole head hurts. She has difficulty describing the pain, but it is intense. The pain is > 10/10 per patient. At no point over the last 4 months has she had 0/10 pain in her head. When she lays down and puts a cold ice pack on her head, it helps. When she gets up and tries to do something, the pain worsens. For the past month, she has had a constant headache. She is currently going through menopause, and she was told this may be due to this.   She cannot sleep well. She endorses phonophobia, nausea, vomiting, and dizziness. She denies photophobia. For the past month, she has barely left the house. It is worse with activity. She denies symptoms worse with change of position. She denies vision changes. She can have tenderness in temple when she has headaches and can have pain when chewing. She endorses tingling in hands and legs over the last 2 months.   She has felt a little better over the last couple of days.   She tried ibuprofen, which did not help. She tried motrin w/ tylenol which helped very little. She would take one every 2-3 days.   She never had headaches previous to this.    Patient presented to the ED on 12/03/21 for near constant headaches for 3 months. The pain was described as wrapping around the head. She endorsed sensitivity to light and sound. CT head showed no acute process. Labs were significant for platelets of 45. Per documentation, headache improved while in the ED. Per patient, the pain was not significantly helped with the cocktail.   Patient currently has a 5/10  headache.   Patient saw her PCP this morning who prescribed topamax 25 mg for 1 week, then increase to 50 mg daily and sumatriptan 50 mg at start of headache. Patient has not picked up either medication yet.   Patient also mentions that she has had poor appetite and lost about 10 pounds over the last month.   She is a former smoker but quit smoking over 6 years ago.   She drinks [employer] coffee 3-4 times per week.  Most recent Assessment and Plan (12/24/21): Her neurological examination is essentially normal today. Available diagnostic data is significant for [facility] showing no acute process. CBC was recently significant for a new thrombocytopenia (45). The etiology of patient's new onset headaches is currently unclear. While this could represent migraines, she has no prior history. Perhaps hormonal changes around menopause are a trigger though. GCA is another consideration given her tenderness to palpation in the temple region and jaw pain with chewing. Her symptoms may be most consistent with new daily persistent headache though. I will work up and treat as below.   PLAN: -Blood work: ESR, CRP, B12, TSH (already ordered by PCP this am) -MRI/MRA brain w/wo contrast -Medrol dose pack to attempt to break current headache -Pain set forth by PCP this morning is a reasonable starting point, so I encouraged patient to start these medications: -Headache rescue:  Sumatriptan 50 mg PRN -Headache prevention:  Topamax 25 mg  for 1 week then increase to 50 mg thereafter -Limit use of pain relievers to no more than 2 days out of week to prevent risk of rebound or medication-overuse headache. -Keep headache diary -Patient to call with new or worsening symptoms, if she is not getting relief, or has side effects or problems with her medications  Since their last visit: Labs and imaging were normal after last visit.  ***  MEDICATIONS:  Outpatient Encounter Medications as of 02/24/2022  Medication Sig Note    Ascorbic Acid (VITAMIN C) 1000 MG tablet Take 1,000 mg by mouth daily.    naproxen (NAPROSYN) 500 MG tablet Take 1 tablet (500 mg total) by mouth 2 (two) times daily with a meal. 04/15/2021: Only takes when eating spicy food   omeprazole (PRILOSEC) 20 MG capsule Take 1 capsule (20 mg total) by mouth daily. (Patient not taking: Reported on 12/24/2021)    SUMAtriptan (IMITREX) 50 MG tablet 1 tab PO at start of headache.  May repeat in 2 hrs if no relief.  Max 2 tabs/24 hrs    topiramate (TOPAMAX) 25 MG tablet 1 tab PO QHS x 1 wk then 2 tabs PO QHS    No facility-administered encounter medications on file as of 02/24/2022.    PAST MEDICAL HISTORY: Past Medical History:  Diagnosis Date   Abnormal Pap smear 1996   Fatty liver    Headache    Pancreatitis    Pancreatitis, chronic (HCC)    Thrombocytopenia (HCC)    Vaginal Pap smear, abnormal     PAST SURGICAL HISTORY: Past Surgical History:  Procedure Laterality Date   BREAST EXCISIONAL BIOPSY Right    BREAST SURGERY     right breast   COLONOSCOPY     ECTOPIC PREGNANCY SURGERY  2012   TUBAL LIGATION Bilateral 03/14/2019   Procedure: POST PARTUM TUBAL LIGATION;  Surgeon: Federico Flake, MD;  Location: [facility] LD [facility]S;  Service: Gynecology;  Laterality: Bilateral;    ALLERGIES: Allergies  Allergen Reactions   Doxycycline Shortness Of Breath and Swelling    Facial swelling also caused blisters in her mouth   Metronidazole Shortness Of Breath and Swelling    Facial swelling and blisters in her mouth    FAMILY HISTORY: Family History  Problem Relation Age of Onset   Diabetes Mother    Diabetes Father    Diabetes Sister    Diabetes Maternal Aunt    Ovarian cancer Maternal Aunt    Diabetes Maternal Uncle    Diabetes Maternal Grandmother    Breast cancer Neg Hx    Colon cancer Neg Hx    Rectal cancer Neg Hx    Stomach cancer Neg Hx     SOCIAL HISTORY: Social History   Tobacco Use   Smoking status: Former    Packs/day:  0.30    Years: 10.00    Total pack years: 3.00    Types: Cigarettes    Quit date: 06/07/2015    Years since quitting: 6.6   Smokeless tobacco: Never  Vaping Use   Vaping Use: Never used  Substance Use Topics   Alcohol use: Not Currently    Comment:  May 2020   Drug use: No   Social History   Social History Narrative   Are you right handed or left handed? Right   Are you currently employed ? no   Caffeine occas.   Do you live at home alone?no   Who lives with you? Husband and 2 kids  What type of home do you live in: 1 story or 2 story? Two story          Objective:  Vital Signs:  There were no vitals taken for this visit.  ***  Labs and Imaging review: New results: 12/24/21: HbA1c: 5.8 B12: 734 CRP: < 1.0 ESR: 11  MRI/MRA brain w/wo contrast (01/10/22): FINDINGS: MRI HEAD FINDINGS   Brain: There is no evidence of an acute infarct, intracranial hemorrhage, mass, midline shift, or extra-axial fluid collection. The ventricles and sulci are normal. The brain is normal in signal. No abnormal enhancement is identified. The cerebellar tonsils are normally positioned. The pituitary gland is normal in size.   Vascular: Major intracranial vascular flow voids are preserved.   Skull and upper cervical spine: No suspicious marrow lesion.   Sinuses/Orbits: Unremarkable orbits. Mucous retention cyst in the left maxillary sinus. Clear mastoid air cells.   Other: None.   MRA HEAD FINDINGS   Anterior circulation: The internal carotid arteries are widely patent from skull base to carotid termini. ACAs and MCAs are patent without evidence of a proximal branch occlusion or significant proximal stenosis. No aneurysm is identified.   Posterior circulation: The included distal vertebral arteries are widely patent to the basilar and codominant. Patent PICA, AICA, and SCA origins are seen bilaterally. The basilar artery is widely patent. There is a small right posterior  communicating artery. Both PCAs are patent without evidence of a significant proximal stenosis. No aneurysm is identified.   Anatomic variants: None.   IMPRESSION: 1. Unremarkable appearance of the brain. 2. Negative head MRA.  Previously reviewed results: Internal labs: Recent Labs       Lab Results  Component Value Date    HGBA1C 5.7 (H) 09/18/2020      Recent Labs       Lab Results  Component Value Date    [id number] 628 05/03/2017      Recent Labs       Lab Results  Component Value Date    TSH 1.510 09/18/2020      Recent Labs$RemoveBef'[]'VrtcsDlCVY$ Expand by Default       Lab Results  Component Value Date    ESRSEDRATE 7 02/02/2021      CBC (12/03/21): significant for platelets of 45 (previously 200s to 300s) BMP significant for mildly elevated glucose   Imaging: CT head wo contrast (12/03/21): FINDINGS: Brain: Normal anatomic configuration. No abnormal intra or extra-axial mass lesion or fluid collection. No abnormal mass effect or midline shift. No evidence of acute intracranial hemorrhage or infarct. Ventricular size is normal. Cerebellum unremarkable.   Vascular: Unremarkable   Skull: Intact   Sinuses/Orbits: Paranasal sinuses are clear. Orbits are unremarkable.   Other: Mastoid air cells and middle ear cavities are clear.   IMPRESSION: No acute intracranial abnormality.  Assessment/Plan:  This is Kelsey Swanson, a 52 y.o. female with: ***   Plan: ***  Return to clinic in ***  Total time spent reviewing records, interview, history/exam, documentation, and coordination of care on day of encounter:  *** min  Kai Levins, MD  CC: ***

## 2022-02-19 ENCOUNTER — Ambulatory Visit: Payer: Medicaid Other | Attending: Internal Medicine | Admitting: Internal Medicine

## 2022-02-19 ENCOUNTER — Encounter: Payer: Self-pay | Admitting: Internal Medicine

## 2022-02-19 VITALS — BP 109/73 | HR 77 | Temp 98.3°F | Ht 63.0 in | Wt 184.0 lb

## 2022-02-19 DIAGNOSIS — R7989 Other specified abnormal findings of blood chemistry: Secondary | ICD-10-CM | POA: Diagnosis not present

## 2022-02-19 DIAGNOSIS — G43709 Chronic migraine without aura, not intractable, without status migrainosus: Secondary | ICD-10-CM

## 2022-02-19 DIAGNOSIS — I700 Atherosclerosis of aorta: Secondary | ICD-10-CM | POA: Diagnosis not present

## 2022-02-19 DIAGNOSIS — L309 Dermatitis, unspecified: Secondary | ICD-10-CM

## 2022-02-19 DIAGNOSIS — K760 Fatty (change of) liver, not elsewhere classified: Secondary | ICD-10-CM | POA: Diagnosis not present

## 2022-02-19 DIAGNOSIS — D696 Thrombocytopenia, unspecified: Secondary | ICD-10-CM | POA: Diagnosis not present

## 2022-02-19 DIAGNOSIS — K625 Hemorrhage of anus and rectum: Secondary | ICD-10-CM

## 2022-02-19 DIAGNOSIS — R634 Abnormal weight loss: Secondary | ICD-10-CM

## 2022-02-19 NOTE — Patient Instructions (Signed)
Please hold Topamax for 3 weeks to see if the rash on the scalp resolves.  If it does not, please give me a call to let me know so that we can refer you to the dermatologist.

## 2022-02-19 NOTE — Progress Notes (Signed)
Patient ID: Kelsey Swanson, female    DOB: 12/01/1970  MRN: [id number]  CC: Migraine (Migraine f/u. /Bumps all over body & scalp./Discuss lab results)   Subjective: Kelsey Swanson is a 51 y.o. female who presents for 6 wks f/u migraines Her concerns today include:  Patient with history of EtOH abuse,  Fatty liver, HL, CTS and alcoholic pancreatitis, chronic HA, low PLT.    Headache: Diagnosed with chronic migraine on last visit.  She had an appointment with neurologist Dr. [place] Crane.  He did further workup including MRI/MRA of the head which was negative.  She was given a Medrol pack which she states broke the headache cycle.  I had prescribed Imitrex and Topamax for her.  She states that she has only had to use the Imitrex about 3 times since then.  She is taking the Topamax at night.  Thrombocytopenia: Saw Dr. Alvy Bimler for evaluation of thrombocytopenia.  She suspect that the cause of the thrombocytopenia is multifactorial likely due to liver disease versus or +/- concurrent ITP.  Given that patient is asymptomatic, she recommends observation.  She will see the patient in follow-up in 3 months.  She did order a CAT scan of the abdomen to evaluate for liver disease.  This showed some hepatic steatosis mild.  LFTs, she had mild elevation in ALT of 57 and alk phos of 149.  Screens for chronic hepatitis B and C were negative.  She has been clean of alcohol for almost 4 years. Incidental finding on abdominal CT was aortic atherosclerosis.  She does not have an up-to-date lipid profile.  Unintentional weight loss: On last visit patient complained of unintentional weight loss which she attributed to decreased appetite and nausea from chronic headaches.  However since then she reports that she has been eating well and getting in her 3 meals.  However she has lost an additional 7 pounds since I last saw her. -Reports some rectal bleeding when she is constipated and having to strain to move her bowels.   Last episode was several days ago.  Last colonoscopy was 1 year ago.  She had some diverticuli and several polyps removed.  No internal/external hemorrhoids were noted.  Other concern is pustular bumps on the scalp and on the chest anteriorly x 3 weeks.  They do not itch.  No recent change in hair products.  She does not use perm or color/dye on her hair. 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     naproxen (NAPROSYN) 500 MG tablet Take 1 tablet (500 mg total) by mouth 2 (two) times daily with a meal. 30 tablet 0   omeprazole (PRILOSEC) 20 MG capsule Take 1  capsule (20 mg total) by mouth daily. 30 capsule 3   SUMAtriptan (IMITREX) 50 MG tablet 1 tab PO at start of headache.  May repeat in 2 hrs if no relief.  Max 2 tabs/24 hrs 10 tablet 1   topiramate (TOPAMAX) 25 MG tablet 1 tab PO QHS x 1 wk then 2 tabs PO QHS 60 tablet 2   No current facility-administered medications on file prior to visit.    Allergies  Allergen Reactions   Doxycycline Shortness Of Breath and Swelling    Facial swelling also caused blisters in her mouth   Metronidazole Shortness Of Breath and Swelling    Facial  swelling and blisters in her mouth    Social History   Socioeconomic History   Marital status: Married    Spouse name: Not on file   Number of children: Not on file   Years of education: Not on file   Highest education level: 6th grade  Occupational History   Occupation: unemployed  Tobacco Use   Smoking status: Former    Packs/day: 0.30    Years: 10.00    Total pack years: 3.00    Types: Cigarettes    Quit date: 06/07/2015    Years since quitting: 6.7   Smokeless tobacco: Never  Vaping Use   Vaping Use: Never used  Substance and Sexual Activity   Alcohol use: Not Currently    Comment:  May 2020   Drug use: No   Sexual activity: Yes    Partners: Male    Birth control/protection: Surgical  Other Topics Concern   Not on file  Social History Narrative   Are you right handed or left handed? Right   Are you currently employed ? no   Caffeine occas.   Do you live at home alone?no   Who lives with you? Husband and 2 kids   What type of home do you live in: 1 story or 2 story? Two story       Social Determinants of Radio broadcast assistant Strain: Not on file  Food Insecurity: Not on file  Transportation Needs: No Transportation Needs (03/29/2019)   PRAPARE - Hydrologist (Medical): No    Lack of Transportation (Non-Medical): No  Physical Activity: Not on file  Stress: Not on file  Social Connections: Not on file  Intimate Partner Violence: Not on file    Family History  Problem Relation Age of Onset   Diabetes Mother    Diabetes Father    Diabetes Sister    Diabetes Maternal Aunt    Ovarian cancer Maternal Aunt    Diabetes Maternal Uncle    Diabetes Maternal Grandmother    Breast cancer Neg Hx    Colon cancer Neg Hx    Rectal cancer Neg Hx    Stomach cancer Neg Hx     Past Surgical History:  Procedure Laterality Date   BREAST EXCISIONAL BIOPSY Right    BREAST SURGERY     right breast   COLONOSCOPY     ECTOPIC PREGNANCY  SURGERY  2012   TUBAL LIGATION Bilateral 03/14/2019   Procedure: POST PARTUM TUBAL LIGATION;  Surgeon: Caren Macadam, MD;  Location: [facility] LD ORS;  Service: Gynecology;  Laterality: Bilateral;    ROS: Review of Systems Negative except as stated above  PHYSICAL EXAM: BP 109/73 (BP Location: Left Arm, Patient Position: Sitting, Cuff Size: Normal)   Pulse 77   Temp 98.3 F (36.8 C) (  Oral)   Ht _0  (1.6 m)   Wt 184 lb (83.5 kg)   SpO2 98%   BMI 32.59 kg/m   Wt Readings from Last 3 Encounters:  02/19/22 184 lb (83.5 kg)  01/25/22 186 lb 12.8 oz (84.7 kg)  01/11/22 185 lb 12.8 oz (84.3 kg)    Physical Exam  General appearance - alert, well appearing, and in no distress Mental status - normal mood, behavior, speech, dress, motor activity, and thought processes Neck - supple, no significant adenopathy Chest - clear to auscultation, no wheezes, rales or rhonchi, symmetric air entry Heart - normal rate, regular rhythm, normal S1, S2, no murmurs, rubs, clicks or gallops Abdomen - soft, nontender, nondistended, no masses or organomegaly Rectal -CMA Clarissa present: No external hemorrhoids noted.  No rectal tear is noted. Extremities - peripheral pulses normal, no pedal edema, no clubbing or cyanosis Skin: A few small scabs noted on the scalp.  Acne type bumps noted on the anterior chest.     Latest Ref Rng & Units 01/18/2022    5:59 PM 12/24/2021   10:06 AM 12/03/2021    5:35 PM  CMP  Glucose 70 - 99 mg/dL   112   BUN 6 - 20 mg/dL   8   Creatinine 0.44 - 1.00 mg/dL 0.50   0.46   Sodium 135 - 145 mmol/L   140   Potassium 3.5 - 5.1 mmol/L   4.1   Chloride 98 - 111 mmol/L   105   CO2 22 - 32 mmol/L   26   Calcium 8.9 - 10.3 mg/dL   8.9   Total Protein 6.0 - 8.5 g/dL  6.8    Total Bilirubin 0.0 - 1.2 mg/dL  0.3    Alkaline Phos 44 - 121 IU/L  149    AST 0 - 40 IU/L  27    ALT 0 - 32 IU/L  57     Lipid Panel     Component Value Date/Time   CHOL 184 09/18/2020 1408    TRIG 137 09/18/2020 1408   HDL 32 (L) 09/18/2020 1408   CHOLHDL 5.8 (H) 09/18/2020 1408   CHOLHDL 4.4 09/02/2017 1132   VLDL 27 09/02/2017 1132   LDLCALC 127 (H) 09/18/2020 1408    CBC    Component Value Date/Time   WBC 6.2 01/11/2022 1348   WBC 7.0 12/03/2021 1735   RBC 4.56 01/11/2022 1348   HGB 12.9 01/11/2022 1348   HGB 12.7 12/24/2021 1006   HCT 38.6 01/11/2022 1348   HCT 37.4 12/24/2021 1006   PLT 78 (L) 01/11/2022 1348   PLT 44 (LL) 12/24/2021 1006   MCV 84.6 01/11/2022 1348   MCV 86 12/24/2021 1006   MCH 28.3 01/11/2022 1348   MCHC 33.4 01/11/2022 1348   RDW 12.4 01/11/2022 1348   RDW 12.7 12/24/2021 1006   LYMPHSABS 2.2 01/11/2022 1348   LYMPHSABS 2.3 09/19/2018 1409   MONOABS 0.5 01/11/2022 1348   EOSABS 0.1 01/11/2022 1348   EOSABS 0.1 09/19/2018 1409   BASOSABS 0.0 01/11/2022 1348   BASOSABS 0.0 09/19/2018 1409    ASSESSMENT AND PLAN:  1. Chronic migraine w/o aura w/o status migrainosus, not intractable Much improved.  Cycle that she was then was broken with the Medrol pack.  Currently on Topamax for prophylaxis.  However I suspect the Topamax may have caused additional weight loss for her and questionably causing the dermatitis.  Advised that she hold the Topamax for about 3 weeks  to see if the dermatitis resolves.  If it does not she will give me a call back to let me know so that we can refer her to a dermatologist.  2. Thrombocytopenia ([place]) Improved on last CBC but still low.  Appreciate Dr. Calton Dach assessment  3. Abnormal LFTs Advised that usually weight loss and healthy eating habits will help with fatty liver. Screen for chronic hepatitis was negative.. - Ambulatory referral to Gastroenterology - Hepatic Function Panel  4. Unintended weight loss The additional 7 pounds that she lost since last visit despite good appetite is likely due to the Topamax.  See #1 above. - Ambulatory referral to Gastroenterology  5. Rectal bleeding Discussed  the importance of keeping bowel movements soft and regular.  Recommend increase fiber in the diet by eating green leafy vegetables with at least one of her meals daily.  Use MiraLAX or prune juice over-the-counter as needed. - Ambulatory referral to Gastroenterology  6. Aortic atherosclerosis (HCC) - Lipid panel  7. Hepatic steatosis See #3 above  8. Dermatitis See #1 above.    Patient was given the opportunity to ask questions.  Patient verbalized understanding of the plan and was able to repeat key elements of the plan.   This documentation was completed using Radio producer.  Any transcriptional errors are unintentional.  Orders Placed This Encounter  Procedures   Lipid panel   Hepatic Function Panel   Ambulatory referral to Gastroenterology     Requested Prescriptions    No prescriptions requested or ordered in this encounter    Return in about 3 months (around 05/21/2022).  Karle Plumber, MD, FACP

## 2022-02-20 LAB — HEPATIC FUNCTION PANEL
ALT: 48 IU/L — ABNORMAL HIGH (ref 0–32)
AST: 20 IU/L (ref 0–40)
Albumin: 4.8 g/dL (ref 3.9–4.9)
Alkaline Phosphatase: 108 IU/L (ref 44–121)
Bilirubin Total: 0.4 mg/dL (ref 0.0–1.2)
Bilirubin, Direct: 0.12 mg/dL (ref 0.00–0.40)
Total Protein: 7.5 g/dL (ref 6.0–8.5)

## 2022-02-20 LAB — LIPID PANEL
Chol/HDL Ratio: 5.3 ratio — ABNORMAL HIGH (ref 0.0–4.4)
Cholesterol, Total: 202 mg/dL — ABNORMAL HIGH (ref 100–199)
HDL: 38 mg/dL — ABNORMAL LOW (ref >39)
LDL Chol Calc (NIH): 127 mg/dL — ABNORMAL HIGH (ref 0–99)
Triglycerides: 207 mg/dL — ABNORMAL HIGH (ref 0–149)
VLDL Cholesterol Cal: 37 mg/dL (ref 5–40)

## 2022-02-24 ENCOUNTER — Ambulatory Visit: Payer: Medicaid Other | Admitting: Neurology

## 2022-03-23 ENCOUNTER — Ambulatory Visit: Payer: Medicaid Other

## 2022-04-20 ENCOUNTER — Encounter: Payer: Self-pay | Admitting: Gastroenterology

## 2022-04-20 ENCOUNTER — Ambulatory Visit: Payer: Medicaid Other | Admitting: Gastroenterology

## 2022-04-20 VITALS — BP 116/60 | HR 79 | Ht 63.0 in | Wt 182.0 lb

## 2022-04-20 DIAGNOSIS — K648 Other hemorrhoids: Secondary | ICD-10-CM | POA: Diagnosis not present

## 2022-04-20 DIAGNOSIS — R1084 Generalized abdominal pain: Secondary | ICD-10-CM | POA: Diagnosis not present

## 2022-04-20 DIAGNOSIS — K760 Fatty (change of) liver, not elsewhere classified: Secondary | ICD-10-CM

## 2022-04-20 DIAGNOSIS — A048 Other specified bacterial intestinal infections: Secondary | ICD-10-CM | POA: Diagnosis not present

## 2022-04-20 DIAGNOSIS — K5909 Other constipation: Secondary | ICD-10-CM

## 2022-04-20 NOTE — Patient Instructions (Addendum)
Follow up as needed.  _______________________________________________________  If your blood pressure at your visit was 140/90 or greater, please contact your primary care physician to follow up on this.  _______________________________________________________  If you are age 50 or older, your body mass index should be between 23-30. Your Body mass index is 32.24 kg/m. If this is out of the aforementioned range listed, please consider follow up with your Primary Care Provider.  If you are age 30 or younger, your body mass index should be between 19-25. Your Body mass index is 32.24 kg/m. If this is out of the aformentioned range listed, please consider follow up with your Primary Care Provider.   ________________________________________________________  The [facility] providers would like to encourage you to use [facility] to communicate with providers for non-urgent requests or questions.  Due to long hold times on the telephone, sending your provider a message by [facility] may be a faster and more efficient way to get a response.  Please allow 48 business hours for a response.  Please remember that this is for non-urgent requests.  _______________________________________________________ It was a pleasure to see you today!  Thank you for trusting me with your gastrointestinal care!

## 2022-04-20 NOTE — Progress Notes (Signed)
[facility] Progress Note  Chief Complaint:  Chief Complaint  Patient presents with   Rectal Bleeding    Pt state having rectal bleeding sometimes.   Abdominal Pain    Pt states having pain on URQ and LLQ pain comes and go. Pt states she thinks its her liver.    Subjective  History: I last saw her 03/2021 for chronic constipation and intermittent rectal bleeding and bloating. Gallstones seen on [place], suspected not the cause of symptoms. H pylori found on gastric biopsy, uncertain correlation with symptoms. She did not finish treatment course, [place]. She had follow up UBT afterward and that confirmed clearance of H pylori.  Notes going back to October show she was having severe escalation of headaches. Neurology evaluated her 12/2021 and they only saw her once. She was also discovered to have low platelets- She saw hematology in 12/2021 and again 01/2022. Hematology ordered CT AP. Suspected low platelets were from liver disease. Known fatty liver disease- previous alcohol use and pancreatitis years ago. _____________________________  She presents today for occasional rectal bleeding and abdominal pain that has been persisted for about 6 months. She states that she experiences rectal bleeding a couple times a week which is often accompanied by pain with bowel movements. She reports having a normal BM typically once a day if she takes Miralax. She states that she usually takes a capful of Miralax daily, and she states that she experiences difficulty having a BM if she does not take Miralax. She reports accompanying bloating and gas.  She denies any vomiting.   She reports that she has lost some weight which she attributes to a viral illness in January lasting x1 month.   She has follow up scheduled with hem onc Dr. Alvy Bimler on 04/27/22.  She states that her previous issue with headaches and nausea/vomiting have resolved on her current treatment. She has lost some weight with months of  protracted headaches causing vomiting, but that has stabilized.  ROS: Cardiovascular:  no chest pain Respiratory: no dyspnea GI: + rectal bleeding, + abdominal pain, + constipation, + bloating/gas, no vomiting Headaches as noted above, lately much improved Remainder systems negative except as above  The patient's Past Medical, Family and Social History were reviewed and are on file in the EMR.  Objective:  Med list reviewed  Current Outpatient Medications:    Ascorbic Acid (VITAMIN C) 1000 MG tablet, Take 1,000 mg by mouth daily., Disp: , Rfl:    naproxen (NAPROSYN) 500 MG tablet, Take 1 tablet (500 mg total) by mouth 2 (two) times daily with a meal., Disp: 30 tablet, Rfl: 0   omeprazole (PRILOSEC) 20 MG capsule, Take 1 capsule (20 mg total) by mouth daily., Disp: 30 capsule, Rfl: 3   SUMAtriptan (IMITREX) 50 MG tablet, 1 tab PO at start of headache.  May repeat in 2 hrs if no relief.  Max 2 tabs/24 hrs, Disp: 10 tablet, Rfl: 1   topiramate (TOPAMAX) 25 MG tablet, 1 tab PO QHS x 1 wk then 2 tabs PO QHS (Patient not taking: Reported on 04/20/2022), Disp: 60 tablet, Rfl: 2   Vital signs in last 24 hrs: Vitals:   04/20/22 1541  BP: 116/60  Pulse: 79  SpO2: 98%   Wt Readings from Last 3 Encounters:  04/20/22 182 lb (82.6 kg)  02/19/22 184 lb (83.5 kg)  01/25/22 186 lb 12.8 oz (84.7 kg)     Physical Exam General: well-appearing  HEENT: sclera anicteric, oral mucosa  moist without lesions Neck: supple, no thyromegaly, JVD or lymphadenopathy Cardiac: RRR,  no peripheral edema Pulm: clear to auscultation bilaterally, normal RR and effort noted Abdomen: soft, no tenderness, with active bowel sounds. No guarding or palpable hepatosplenomegaly. Skin: warm and dry, no jaundice or rash  Labs:     Latest Ref Rng & Units 01/11/2022    1:48 PM 12/24/2021   10:06 AM 12/03/2021    5:35 PM  CBC  WBC 4.0 - 10.5 K/uL 6.2  7.5  7.0   Hemoglobin 12.0 - 15.0 g/dL 12.9  12.7  13.0    Hematocrit 36.0 - 46.0 % 38.6  37.4  37.5   Platelets 150 - 400 K/uL 78  44  45       Latest Ref Rng & Units 02/19/2022   10:47 AM 01/18/2022    5:59 PM 12/24/2021   10:06 AM  CMP  Creatinine 0.44 - 1.00 mg/dL  0.50    Total Protein 6.0 - 8.5 g/dL 7.5   6.8   Total Bilirubin 0.0 - 1.2 mg/dL 0.4   0.3   Alkaline Phos 44 - 121 IU/L 108   149   AST 0 - 40 IU/L 20   27   ALT 0 - 32 IU/L 48   57     ___________________________________________ Radiologic studies:  EXAM: CT ABDOMEN AND PELVIS WITH CONTRAST 01/18/22   TECHNIQUE: Multidetector CT imaging of the abdomen and pelvis was performed using the standard protocol following bolus administration of intravenous contrast.   RADIATION DOSE REDUCTION: This exam was performed according to the departmental dose-optimization program which includes automated exposure control, adjustment of the mA and/or kV according to patient size and/or use of iterative reconstruction technique.   CONTRAST:  165m OMNIPAQUE IOHEXOL 300 MG/ML  SOLN   COMPARISON:  07/01/2018   FINDINGS: Lower Chest: No acute findings.   Hepatobiliary: No hepatic masses identified. Mild diffuse hepatic steatosis is noted, without significant hepatomegaly. Gallbladder is unremarkable. No evidence of biliary ductal dilatation.   Pancreas: No evidence acute pancreatitis or peripancreatic fluid collections. No evidence of pancreatic mass or ductal dilatation.   Spleen: Within normal limits in size and appearance.   Adrenals/Urinary Tract: No suspicious masses identified. No evidence of ureteral calculi or hydronephrosis.   Stomach/Bowel: No evidence of obstruction, inflammatory process or abnormal fluid collections. Normal appendix visualized.   Vascular/Lymphatic: No pathologically enlarged lymph nodes. No acute vascular findings. Aortic atherosclerotic calcification incidentally noted.   Reproductive:  No mass or other significant abnormality.    Other:  None.   Musculoskeletal:  No suspicious bone lesions identified.   IMPRESSION: No radiographic evidence of pancreatitis, pseudocysts, or other acute findings.   Mild hepatic steatosis.   Aortic Atherosclerosis (ICD10-I70.0).  I have personally reviewed these images and report.  ____________________________________________ Other:    _____________________________________________ Assessment & Plan   Assessment: Internal hemorrhoid, bleeding  Chronic constipation  Generalized abdominal pain  H. pylori infection  Non-alcoholic fatty liver disease  She has fatty liver seen on imaging studies, unknown how much may be metabolic and how much from prior alcohol use (abstinent for years). She has chronic constipation and some associated bloating and scattered areas of abdominal pain related to that. The constipation is precipitating rectal bleeding that I believe is hemorrhoidal in nature, exacerbated by her severe thrombocytopenia.  On that note, there is no evidence that this patient has cirrhosis and portal hypertension to explain her thrombocytopenia.   Plan: Recommend taking 1 capful of Miralax  every day. Recommend magnesium capsules 400 mg at bedtime. Elevate feet slightly while sitting on the toilet for bowel movements. We discussed that hemorrhoidal banding may be an option for symptomatic relief later on if it persists despite normalization of platelets.   Wilfrid Lund, MD    Velora Heckler Lanette Hampshire, [place], MD, have reviewed all documentation for this visit. The documentation on 04/20/22 for the exam, diagnosis, procedures, and orders are all accurate and complete.  30 minutes were spent on this encounter (including chart review, history/exam, counseling/coordination of care, and documentation) > 50% of that time was spent on counseling and coordination of care.    I,Alexis Herring,acting as a Education administrator for [place], MD.,have documented all relevant  documentation on the behalf of Doran Stabler, MD,as directed by  Doran Stabler, MD while in the presence of Doran Stabler, MD.    Eugene Gavia

## 2022-04-27 ENCOUNTER — Inpatient Hospital Stay: Payer: Medicaid Other | Attending: Hematology and Oncology

## 2022-04-27 ENCOUNTER — Encounter: Payer: Self-pay | Admitting: Hematology and Oncology

## 2022-04-27 ENCOUNTER — Other Ambulatory Visit: Payer: Self-pay

## 2022-04-27 ENCOUNTER — Inpatient Hospital Stay (HOSPITAL_BASED_OUTPATIENT_CLINIC_OR_DEPARTMENT_OTHER): Payer: Medicaid Other | Admitting: Hematology and Oncology

## 2022-04-27 VITALS — BP 111/67 | HR 71 | Temp 97.6°F | Resp 18 | Ht 63.0 in | Wt 182.4 lb

## 2022-04-27 DIAGNOSIS — R7989 Other specified abnormal findings of blood chemistry: Secondary | ICD-10-CM

## 2022-04-27 DIAGNOSIS — Z79899 Other long term (current) drug therapy: Secondary | ICD-10-CM | POA: Diagnosis not present

## 2022-04-27 DIAGNOSIS — D696 Thrombocytopenia, unspecified: Secondary | ICD-10-CM

## 2022-04-27 DIAGNOSIS — K760 Fatty (change of) liver, not elsewhere classified: Secondary | ICD-10-CM | POA: Diagnosis not present

## 2022-04-27 LAB — CBC WITH DIFFERENTIAL (CANCER CENTER ONLY)
Abs Immature Granulocytes: 0.01 10*3/uL (ref 0.00–0.07)
Basophils Absolute: 0 10*3/uL (ref 0.0–0.1)
Basophils Relative: 1 %
Eosinophils Absolute: 0.2 10*3/uL (ref 0.0–0.5)
Eosinophils Relative: 3 %
HCT: 37.3 % (ref 36.0–46.0)
Hemoglobin: 12.5 g/dL (ref 12.0–15.0)
Immature Granulocytes: 0 %
Lymphocytes Relative: 44 %
Lymphs Abs: 2.6 10*3/uL (ref 0.7–4.0)
MCH: 29.2 pg (ref 26.0–34.0)
MCHC: 33.5 g/dL (ref 30.0–36.0)
MCV: 87.1 fL (ref 80.0–100.0)
Monocytes Absolute: 0.3 10*3/uL (ref 0.1–1.0)
Monocytes Relative: 6 %
Neutro Abs: 2.8 10*3/uL (ref 1.7–7.7)
Neutrophils Relative %: 46 %
Platelet Count: 262 10*3/uL (ref 150–400)
RBC: 4.28 MIL/uL (ref 3.87–5.11)
RDW: 15.1 % (ref 11.5–15.5)
WBC Count: 5.9 10*3/uL (ref 4.0–10.5)
nRBC: 0 % (ref 0.0–0.2)

## 2022-04-27 LAB — CMP (CANCER CENTER ONLY)
ALT: 35 U/L (ref 0–44)
AST: 17 U/L (ref 15–41)
Albumin: 4.7 g/dL (ref 3.5–5.0)
Alkaline Phosphatase: 70 U/L (ref 38–126)
Anion gap: 6 (ref 5–15)
BUN: 12 mg/dL (ref 6–20)
CO2: 29 mmol/L (ref 22–32)
Calcium: 9.7 mg/dL (ref 8.9–10.3)
Chloride: 105 mmol/L (ref 98–111)
Creatinine: 0.53 mg/dL (ref 0.44–1.00)
GFR, Estimated: >60 mL/min (ref >60)
Glucose, Bld: 98 mg/dL (ref 70–99)
Potassium: 3.9 mmol/L (ref 3.5–5.1)
Sodium: 140 mmol/L (ref 135–145)
Total Bilirubin: 0.4 mg/dL (ref 0.3–1.2)
Total Protein: 7.7 g/dL (ref 6.5–8.1)

## 2022-04-27 NOTE — Progress Notes (Signed)
[place] OFFICE PROGRESS NOTE  [name], MD  ASSESSMENT & PLAN:  Thrombocytopenia [facility]) This has resolved The cause is unknown, ITP cannot be excluded  She does not need long-term follow-up  Fatty liver She had history of fatty liver disease but her liver enzymes are now normal She does not need long-term follow-up  No orders of the defined types were placed in this encounter.   The total time spent in the appointment was 20 minutes encounter with patients including review of chart and various tests results, discussions about plan of care and coordination of care plan   All questions were answered. The patient knows to call the clinic with any problems, questions or concerns. No barriers to learning was detected.    Heath Lark, MD 3/5/202412:28 PM  INTERVAL HISTORY: Kelsey Swanson 52 y.o. female returns for further follow-up for history of thrombocytopenia She denies recent bleeding  SUMMARY OF HEMATOLOGIC HISTORY:  She was found to have abnormal CBC from thrombocytopenia with repeat blood work when she presented to the emergency department I have the opportunity to review her CBC dated back to 2011 On 06/04/2009, platelet count was 212 On 04/06/2017, her platelet count was 85 On 04/20/2017, her platelet count was 786 On December 03, 2021, her platelet count was 45 On December 24, 2021, her platelet count was 44  Over the past few years, the patient had recurrent admission to the hospital due to alcoholic pancreatitis This was confirmed on imaging studies In 2018, CT imaging show evidence of pancreatitis and severe hepatic steatosis On Jul 01, 2018, MRI showed evidence of pancreatitis as well as hepatic steatosis She denies alcohol use for the last 3 years  She denies recent bruising/bleeding, such as spontaneous epistaxis, hematuria, melena or hematochezia She has recent excessive menorrhagia.  Her menstrual cycle has become somewhat  irregular with passage of large amount of clots.  Last month, she had menstrual cycle twice that lasted for 8 days She had recent migraine headaches with vomiting.  She have lost about 20 pounds over the past few months due to nausea The patient denies history of liver disease, exposure to heparin, history of cardiac murmur/prior cardiovascular surgery or recent new medications She denies prior blood or platelet transfusions Her primary care doctor has recently order an extensive panel including hepatitis panel, B12 and thyroid function test.  All the results came back unremarkable except for borderline elevated liver enzymes- Repeat blood work showed improvement of platelet count.  CT imaging in November showed evidence of hepatic steatosis   I have reviewed the past medical history, past surgical history, social history and family history with the patient and they are unchanged from previous note.  ALLERGIES:  is allergic to doxycycline and metronidazole.  MEDICATIONS:  Current Outpatient Medications  Medication Sig Dispense Refill   Ascorbic Acid (VITAMIN C) 1000 MG tablet Take 1,000 mg by mouth daily.     naproxen (NAPROSYN) 500 MG tablet Take 1 tablet (500 mg total) by mouth 2 (two) times daily with a meal. 30 tablet 0   omeprazole (PRILOSEC) 20 MG capsule Take 1 capsule (20 mg total) by mouth daily. 30 capsule 3   SUMAtriptan (IMITREX) 50 MG tablet 1 tab PO at start of headache.  May repeat in 2 hrs if no relief.  Max 2 tabs/24 hrs 10 tablet 1   topiramate (TOPAMAX) 25 MG tablet 1 tab PO QHS x 1 wk then 2 tabs PO QHS (Patient not  taking: Reported on 04/20/2022) 60 tablet 2   No current facility-administered medications for this visit.     REVIEW OF SYSTEMS:   Constitutional: Denies fevers, chills or night sweats Eyes: Denies blurriness of vision Ears, nose, mouth, throat, and face: Denies mucositis or sore throat Respiratory: Denies cough, dyspnea or wheezes Cardiovascular: Denies  palpitation, chest discomfort or lower extremity swelling Gastrointestinal:  Denies nausea, heartburn or change in bowel habits Skin: Denies abnormal skin rashes Lymphatics: Denies new lymphadenopathy or easy bruising Neurological:Denies numbness, tingling or new weaknesses Behavioral/Psych: Mood is stable, no new changes  All other systems were reviewed with the patient and are negative.  PHYSICAL EXAMINATION: ECOG PERFORMANCE STATUS: 0 - Asymptomatic  Vitals:   04/27/22 1037  BP: 111/67  Pulse: 71  Resp: 18  Temp: 97.6 F (36.4 C)  SpO2: 100%   Filed Weights   04/27/22 1037  Weight: 182 lb 6.4 oz (82.7 kg)    GENERAL:alert, no distress and comfortable  NEURO: alert & oriented x 3 with fluent speech, no focal motor/sensory deficits  LABORATORY DATA:  I have reviewed the data as listed     Component Value Date/Time   NA 140 04/27/2022 1017   NA 138 09/18/2020 1408   K 3.9 04/27/2022 1017   CL 105 04/27/2022 1017   CO2 29 04/27/2022 1017   GLUCOSE 98 04/27/2022 1017   BUN 12 04/27/2022 1017   BUN 11 09/18/2020 1408   CREATININE 0.53 04/27/2022 1017   CALCIUM 9.7 04/27/2022 1017   PROT 7.7 04/27/2022 1017   PROT 7.5 02/19/2022 1047   ALBUMIN 4.7 04/27/2022 1017   ALBUMIN 4.8 02/19/2022 1047   AST 17 04/27/2022 1017   ALT 35 04/27/2022 1017   ALKPHOS 70 04/27/2022 1017   BILITOT 0.4 04/27/2022 1017   GFRNONAA >60 04/27/2022 1017   GFRAA >60 05/18/2019 0520    No results found for: "SPEP", "UPEP"  Lab Results  Component Value Date   WBC 5.9 04/27/2022   NEUTROABS 2.8 04/27/2022   HGB 12.5 04/27/2022   HCT 37.3 04/27/2022   MCV 87.1 04/27/2022   PLT 262 04/27/2022      Chemistry      Component Value Date/Time   NA 140 04/27/2022 1017   NA 138 09/18/2020 1408   K 3.9 04/27/2022 1017   CL 105 04/27/2022 1017   CO2 29 04/27/2022 1017   BUN 12 04/27/2022 1017   BUN 11 09/18/2020 1408   CREATININE 0.53 04/27/2022 1017      Component Value  Date/Time   CALCIUM 9.7 04/27/2022 1017   ALKPHOS 70 04/27/2022 1017   AST 17 04/27/2022 1017   ALT 35 04/27/2022 1017   BILITOT 0.4 04/27/2022 1017

## 2022-04-27 NOTE — Assessment & Plan Note (Signed)
This has resolved The cause is unknown, ITP cannot be excluded  She does not need long-term follow-up

## 2022-04-27 NOTE — Assessment & Plan Note (Signed)
She had history of fatty liver disease but her liver enzymes are now normal She does not need long-term follow-up

## 2022-05-21 ENCOUNTER — Ambulatory Visit: Payer: Medicaid Other | Admitting: Internal Medicine

## 2022-05-28 ENCOUNTER — Encounter: Payer: Self-pay | Admitting: Internal Medicine

## 2022-05-28 ENCOUNTER — Ambulatory Visit: Payer: Medicaid Other | Attending: Internal Medicine | Admitting: Internal Medicine

## 2022-05-28 VITALS — BP 111/74 | HR 71 | Temp 97.9°F | Ht 63.0 in | Wt 183.0 lb

## 2022-05-28 DIAGNOSIS — M533 Sacrococcygeal disorders, not elsewhere classified: Secondary | ICD-10-CM

## 2022-05-28 DIAGNOSIS — D229 Melanocytic nevi, unspecified: Secondary | ICD-10-CM | POA: Diagnosis not present

## 2022-05-28 DIAGNOSIS — I700 Atherosclerosis of aorta: Secondary | ICD-10-CM

## 2022-05-28 DIAGNOSIS — E782 Mixed hyperlipidemia: Secondary | ICD-10-CM

## 2022-05-28 DIAGNOSIS — M542 Cervicalgia: Secondary | ICD-10-CM

## 2022-05-28 MED ORDER — NAPROXEN 500 MG PO TABS: 500.0000 mg | ORAL_TABLET | Freq: Two times a day (BID) | ORAL | 1 refills | Status: AC | PRN

## 2022-05-28 NOTE — Progress Notes (Signed)
Patient ID: Kelsey Swanson, female    DOB: 14-Jan-1971  MRN: [id number]  CC: Migraine (Chronic migraine f/u. Med refill - naproxen/Intermittent pain on coccyx, constantly painful for X1 week/Dark mass on L leg, painful, X1 yr)   Subjective: Kelsey Swanson is a 52 y.o. female who presents for chronic ds management Her concerns today include:  Patient with history of EtOH abuse,  Fatty liver, HL, CTS and alcoholic pancreatitis, chronic migraine HA, low PLT.   Thrombocytopenia: Since last visit, she saw Dr. Bertis Ruddy again in follow-up 04/27/2022.  Her initial assessment was that thrombocytopenia likely due to liver disease plus or minus ITP.  Plan was to observe.  Repeat CBC since then showed normalization of platelet count.  She also saw her gastroenterologist Dr. Myrtie Neither 04/20/2022 for abnormal LFTs with weight loss.  Even though she has fatty liver, Dr. Myrtie Neither saw no evidence of cirrhosis or portal hypertension to explain the low platelet count.  However platelet count has since normalized.  LFTs also normalized.  She inquired about her cholesterol.  LDL was 127/T.chol 202.  Patient with aortic atherosclerosis on CAT scan of the abdomen done in 12/2021.  Needed RF on Naprosyn for pain in hands.   C/o pain in tailbone x 1 wk; no initiating factors.  No falls.  Similar intermittent episodes occur about 4-5x/yr.  We did x-ray in 2022 of tail bone for same issue that was negative She has also noted some pain on the left side of the neck over the past week.  It does not radiate.  Does not think that she slept on her pillow wrong.  C/o black spot on LT upper leg x 1 wk.  Sore at time; has increased in size.  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     naproxen (NAPROSYN) 500 MG tablet Take 1 tablet (500 mg total) by mouth 2 (two) times daily with a meal. 30 tablet 0   omeprazole (PRILOSEC) 20 MG capsule Take 1 capsule (20 mg total) by mouth daily. 30 capsule 3   SUMAtriptan (IMITREX) 50 MG tablet 1 tab PO at start of headache.  May repeat in 2 hrs if no relief.  Max 2 tabs/24 hrs (Patient not taking: Reported on 05/28/2022) 10 tablet 1   topiramate (TOPAMAX) 25 MG tablet 1 tab PO QHS x 1 wk then 2 tabs PO QHS (Patient not taking: Reported on 04/20/2022) 60 tablet 2   No current facility-administered medications on file prior to visit.    Allergies  Allergen Reactions   Doxycycline Shortness Of Breath and Swelling    Facial swelling also caused blisters in her mouth  Metronidazole Shortness Of Breath and Swelling    Facial swelling and blisters in her mouth    Social History   Socioeconomic History   Marital status: Married    Spouse name: Not on file   Number of children: Not on file   Years of education: Not on file   Highest education level: 6th grade  Occupational History   Occupation: unemployed  Tobacco Use   Smoking status: Former    Packs/day: 0.30    Years: 10.00    Additional pack years: 0.00    Total pack years: 3.00    Types: Cigarettes    Quit date: 06/07/2015    Years since quitting: 6.9   Smokeless tobacco: Never  Vaping Use   Vaping  Use: Never used  Substance and Sexual Activity   Alcohol use: Not Currently    Comment:  May 2020   Drug use: No   Sexual activity: Yes    Partners: Male    Birth control/protection: Surgical  Other Topics Concern   Not on file  Social History Narrative   Are you right handed or left handed? Right   Are you currently employed ? no   Caffeine occas.   Do you live at home alone?no   Who lives with you? Husband and 2 kids   What type of home do you live in: 1 story or 2 story? Two story       Social Determinants of Corporate investment bankerHealth   Financial Resource Strain: Not on file  Food Insecurity: Not on file  Transportation Needs: No Transportation Needs (03/29/2019)   PRAPARE - Administrator, Civil ServiceTransportation    Lack of Transportation (Medical): No    Lack of Transportation (Non-Medical): No  Physical Activity: Not on file  Stress: Not on file  Social Connections: Not on file  Intimate Partner Violence: Not on file    Family History  Problem Relation Age of Onset   Diabetes Mother    Diabetes Father    Diabetes Sister    Diabetes Maternal Aunt    Ovarian cancer Maternal Aunt    Diabetes Maternal Uncle    Diabetes Maternal Grandmother    Breast cancer Neg Hx    Colon cancer Neg Hx    Rectal cancer Neg Hx    Stomach cancer Neg Hx     Past Surgical History:  Procedure Laterality Date   BREAST EXCISIONAL BIOPSY Right    BREAST SURGERY     right breast   COLONOSCOPY     ECTOPIC PREGNANCY SURGERY  2012   TUBAL LIGATION Bilateral 03/14/2019   Procedure: POST PARTUM TUBAL LIGATION;  Surgeon: Federico FlakeNewton, Kimberly Niles, MD;  Location: [facility] LD ORS;  Service: Gynecology;  Laterality: Bilateral;    ROS: Review of Systems Negative except as stated above  PHYSICAL EXAM: BP 111/74 (BP Location: Left Arm, Patient Position: Sitting, Cuff Size: Normal)   Pulse 71   Temp 97.9 F (36.6 C) (Oral)   Ht 5\' 3"  (1.6 m)   Wt 183 lb (83 kg)   SpO2 99%   BMI 32.42 kg/m   Physical Exam  General appearance - alert,  well appearing, and in no distress Mental status - normal mood, behavior, speech, dress, motor activity, and thought processes Musculoskeletal -neck: No point tenderness on palpation of the cervical spine.  She has some mild discomfort on passive range of motion in all directions of the neck.  Mild tenderness over the left trapezius muscle. Mild tenderness on palpation over the coccyx  bone. Skin -1 x 1 cm slightly raised hyperpigmented lesion on the left lower leg lateral calf   The 10-year ASCVD risk score (Arnett DK, et al., 2019) is: 1.4%   Values used to calculate the score:     Age: [age] years     Sex: Female     Is Non-Hispanic African American: No     Diabetic: No     Tobacco smoker: No     Systolic Blood Pressure: 111 mmHg     Is BP treated: No     HDL Cholesterol: 38 mg/dL     Total Cholesterol: 202 mg/dL     Latest Ref Rng & Units 04/27/2022   10:17 AM 02/19/2022   10:47 AM 01/18/2022    5:59 PM  CMP  Glucose 70 - 99 mg/dL 98     BUN 6 - 20 mg/dL 12     Creatinine 1.610.44 - 1.00 mg/dL 0.960.53   0.450.50   Sodium 409135 - 145 mmol/L 140     Potassium 3.5 - 5.1 mmol/L 3.9     Chloride 98 - 111 mmol/L 105     CO2 22 - 32 mmol/L 29     Calcium 8.9 - 10.3 mg/dL 9.7     Total Protein 6.5 - 8.1 g/dL 7.7  7.5    Total Bilirubin 0.3 - 1.2 mg/dL 0.4  0.4    Alkaline Phos 38 - 126 U/L 70  108    AST 15 - 41 U/L 17  20    ALT 0 - 44 U/L 35  48     Lipid Panel     Component Value Date/Time   CHOL 202 (H) 02/19/2022 1047   TRIG 207 (H) 02/19/2022 1047   HDL 38 (L) 02/19/2022 1047   CHOLHDL 5.3 (H) 02/19/2022 1047   CHOLHDL 4.4 09/02/2017 1132   VLDL 27 09/02/2017 1132   LDLCALC 127 (H) 02/19/2022 1047    CBC    Component Value Date/Time   WBC 5.9 04/27/2022 1017   WBC 7.0 12/03/2021 1735   RBC 4.28 04/27/2022 1017   HGB 12.5 04/27/2022 1017   HGB 12.7 12/24/2021 1006   HCT 37.3 04/27/2022 1017   HCT 37.4 12/24/2021 1006   PLT 262 04/27/2022 1017   PLT 44 (LL) 12/24/2021 1006    MCV 87.1 04/27/2022 1017   MCV 86 12/24/2021 1006   MCH 29.2 04/27/2022 1017   MCHC 33.5 04/27/2022 1017   RDW 15.1 04/27/2022 1017   RDW 12.7 12/24/2021 1006   LYMPHSABS 2.6 04/27/2022 1017   LYMPHSABS 2.3 09/19/2018 1409   MONOABS 0.3 04/27/2022 1017   EOSABS 0.2 04/27/2022 1017   EOSABS 0.1 09/19/2018 1409   BASOSABS 0.0 04/27/2022 1017   BASOSABS 0.0 09/19/2018 1409    ASSESSMENT AND PLAN:  1. Mixed hyperlipidemia Discussed on encourage healthy eating habits including use of healthy oils, avoiding fried foods and not too much dairy products.  Based on her ASCVD, we can observe off statin therapy for now and recheck lipid profile in about a year.  2. Tail bone pain Of questionable etiology.  We had addressed similar episode in the past.  I think she gets intermittent flareups of inflammation. - naproxen (NAPROSYN) 500 MG tablet; Take 1 tablet (500 mg total) by mouth 2 (two) times daily as needed (take with food).  Dispense: 40 tablet; Refill: 1  3. Acute neck pain Most likely strain probably from poor positioning during sleep.  She will use the  Naprosyn as needed.  I also recommended over-the-counter IcyHot rub. - naproxen (NAPROSYN) 500 MG tablet; Take 1 tablet (500 mg total) by mouth 2 (two) times daily as needed (take with food).  Dispense: 40 tablet; Refill: 1  4. Atypical mole Referred to dermatology.  5. Aortic atherosclerosis See #1 above.    Patient was given the opportunity to ask questions.  Patient verbalized understanding of the plan and was able to repeat key elements of the plan.   This documentation was completed using Paediatric nurse.  Any transcriptional errors are unintentional.  No orders of the defined types were placed in this encounter.    Requested Prescriptions    No prescriptions requested or ordered in this encounter    No follow-ups on file.  Jonah Blue, MD, FACP

## 2022-05-28 NOTE — Patient Instructions (Signed)
Dyslipidemia Dyslipidemia is an imbalance of waxy, fat-like substances (lipids) in the blood. The body needs lipids in small amounts. Dyslipidemia often involves a high level of cholesterol or triglycerides, which are types of lipids. Common forms of dyslipidemia include: High levels of LDL cholesterol. LDL is the type of cholesterol that causes fatty deposits (plaques) to build up in the blood vessels that carry blood away from the heart (arteries). Low levels of HDL cholesterol. HDL cholesterol is the type of cholesterol that protects against heart disease. High levels of HDL remove the LDL buildup from arteries. High levels of triglycerides. Triglycerides are a fatty substance in the blood that is linked to a buildup of plaques in the arteries. What are the causes? There are two main types of dyslipidemia: primary and secondary. Primary dyslipidemia is caused by changes (mutations) in genes that are passed down through families (inherited). These mutations cause several types of dyslipidemia. Secondary dyslipidemia may be caused by various risk factors that can lead to the disease, such as lifestyle choices and certain medical conditions. What increases the risk? You are more likely to develop this condition if you are an older man or if you are a woman who has gone through menopause. Other risk factors include: Having a family history of dyslipidemia. Taking certain medicines, including birth control pills, steroids, some diuretics, and beta-blockers. Eating a diet high in saturated fat. Smoking cigarettes or excessive alcohol intake. Having certain medical conditions such as diabetes, polycystic ovary syndrome (PCOS), kidney disease, liver disease, or hypothyroidism. Not exercising regularly. Being overweight or obese with too much belly fat. What are the signs or symptoms? In most cases, dyslipidemia does not usually cause any symptoms. In severe cases, very high lipid levels can  cause: Fatty bumps under the skin (xanthomas). A white or gray ring around the black center (pupil) of the eye. Very high triglyceride levels can cause inflammation of the pancreas (pancreatitis). How is this diagnosed? Your health care provider may diagnose dyslipidemia based on a routine blood test (fasting blood test). Because most people do not have symptoms of the condition, this blood testing (lipid profile) is done on adults age 20 and older and is repeated every 4-6 years. This test checks: Total cholesterol. This measures the total amount of cholesterol in your blood, including LDL cholesterol, HDL cholesterol, and triglycerides. A healthy number is below 200 mg/dL (5.17 mmol/L). LDL cholesterol. The target number for LDL cholesterol is different for each person, depending on individual risk factors. A healthy number is usually below 100 mg/dL (2.59 mmol/L). Ask your health care provider what your LDL cholesterol should be. HDL cholesterol. An HDL level of 60 mg/dL (1.55 mmol/L) or higher is best because it helps to protect against heart disease. A number below 40 mg/dL (1.03 mmol/L) for men or below 50 mg/dL (1.29 mmol/L) for women increases the risk for heart disease. Triglycerides. A healthy triglyceride number is below 150 mg/dL (1.69 mmol/L). If your lipid profile is abnormal, your health care provider may do other blood tests. How is this treated? Treatment depends on the type of dyslipidemia that you have and your other risk factors for heart disease and stroke. Your health care provider will have a target range for your lipid levels based on this information. Treatment for dyslipidemia starts with lifestyle changes, such as diet and exercise. Your health care provider may recommend that you: Get regular exercise. Make changes to your diet. Quit smoking if you smoke. Limit your alcohol intake. If diet   changes and exercise do not help you reach your goals, your health care provider  may also prescribe medicine to lower lipids. The most commonly prescribed type of medicine lowers your LDL cholesterol (statin drug). If you have a high triglyceride level, your provider may prescribe another type of drug (fibrate) or an omega-3 fish oil supplement, or both. Follow these instructions at home: Eating and drinking  Follow instructions from your health care provider or dietitian about eating or drinking restrictions. Eat a healthy diet as told by your health care provider. This can help you reach and maintain a healthy weight, lower your LDL cholesterol, and raise your HDL cholesterol. This may include: Limiting your calories, if you are overweight. Eating more fruits, vegetables, whole grains, fish, and lean meats. Limiting saturated fat, trans fat, and cholesterol. Do not drink alcohol if: Your health care provider tells you not to drink. You are pregnant, may be pregnant, or are planning to become pregnant. If you drink alcohol: Limit how much you have to: 0-1 drink a day for women. 0-2 drinks a day for men. Know how much alcohol is in your drink. In the [place] one drink equals one 12 oz bottle of beer (355 mL), one 5 oz glass of wine (148 mL), or one 1 oz glass of hard liquor (44 mL). Activity Get regular exercise. Start an exercise and strength training program as told by your health care provider. Ask your health care provider what activities are safe for you. Your health care provider may recommend: 30 minutes of aerobic activity 4-6 days a week. Brisk walking is an example of aerobic activity. Strength training 2 days a week. General instructions Do not use any products that contain nicotine or tobacco. These products include cigarettes, chewing tobacco, and vaping devices, such as e-cigarettes. If you need help quitting, ask your health care provider. Take over-the-counter and prescription medicines only as told by your health care provider. This includes  supplements. Keep all follow-up visits. This is important. Contact a health care provider if: You are having trouble sticking to your exercise or diet plan. You are struggling to quit smoking or to control your use of alcohol. Summary Dyslipidemia often involves a high level of cholesterol or triglycerides, which are types of lipids. Treatment depends on the type of dyslipidemia that you have and your other risk factors for heart disease and stroke. Treatment for dyslipidemia starts with lifestyle changes, such as diet and exercise. Your health care provider may prescribe medicine to lower lipids. This information is not intended to replace advice given to you by your health care provider. Make sure you discuss any questions you have with your health care provider. Document Revised: 09/11/2021 Document Reviewed: 04/14/2020 Elsevier Patient Education  2023 [employer] Inc[employer]  

## 2022-06-16 ENCOUNTER — Ambulatory Visit: Payer: Medicaid Other

## 2022-11-22 ENCOUNTER — Encounter: Payer: Self-pay | Admitting: Dermatology

## 2022-11-22 ENCOUNTER — Ambulatory Visit: Payer: Medicaid Other | Admitting: Dermatology

## 2022-11-22 VITALS — BP 103/67 | HR 89

## 2022-11-22 DIAGNOSIS — L810 Postinflammatory hyperpigmentation: Secondary | ICD-10-CM | POA: Diagnosis not present

## 2022-11-22 DIAGNOSIS — L700 Acne vulgaris: Secondary | ICD-10-CM | POA: Diagnosis not present

## 2022-11-22 DIAGNOSIS — D239 Other benign neoplasm of skin, unspecified: Secondary | ICD-10-CM

## 2022-11-22 DIAGNOSIS — D2372 Other benign neoplasm of skin of left lower limb, including hip: Secondary | ICD-10-CM | POA: Diagnosis not present

## 2022-11-22 MED ORDER — TRETINOIN 0.025 % EX CREA: TOPICAL_CREAM | CUTANEOUS | 2 refills | Status: DC

## 2022-11-22 NOTE — Progress Notes (Signed)
   New Patient Visit   Subjective  Kelsey Swanson is a 52 y.o. female who presents for the following: patient here today concerning a spot at left leg she noticed over a year ago. She reports it does itch at times. Patient is also concerned with bumps at neck and jaw area.     The following portions of the chart were reviewed this encounter and updated as appropriate: medications, allergies, medical history  Review of Systems:  No other skin or systemic complaints except as noted in HPI or Assessment and Plan.  Objective  Well appearing patient in no apparent distress; mood and affect are within normal limits.    A focused examination was performed of the following areas: Left leg , face, neck  Relevant exam findings are noted in the Assessment and Plan.    Assessment & Plan   DERMATOFIBROMA Exam: brown firm nodule with dimpling and palpation at Left lateral upper thigh   Treatment Plan: A dermatofibroma is a benign growth possibly related to trauma, such as an insect bite, cut from shaving, or inflamed acne-type bump.  Treatment options to remove include shave or excision with resulting scar and risk of recurrence.  Since benign-appearing and not bothersome, will observe for now.   - Dermatofibroma Care:   - Precautions: Avoid picking or shaving over the dermatofibroma on your thigh to prevent enlargement.   - Monitoring: Keep an eye on the nodule, and should it grow significantly or become painful, please return for a follow-up.   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  Condition is bothersome/symptomatic for patient. Currently flared.   Treatment Plan:    - Medication: Start Tretinoin cream 0.025% apply to the affected facial areas at night.   - Application: Use only a pea-sized amount, three times a week (Monday, Wednesday, Friday).  Samples of Cerave Acne foaming wash and La Roche Pose acne wash given to patient.    - Moisturizing: Follow up with a moisturizer to prevent dryness.    - Duration: Continue this regimen for three months, at which point we will evaluate its effectiveness and consider additional treatment if necessary.    No follow-ups on file.  I, Asher Muir, CMA, am acting as scribe for [employer], DO.   Documentation: I have reviewed the above documentation for accuracy and completeness, and I agree with the above.  Langston Reusing, DO

## 2022-11-22 NOTE — Patient Instructions (Signed)
Hello Kelsey Swanson,  Thank you for visiting my office today. Your dedication to enhancing your skin health is greatly appreciated. Below is a summary of the essential instructions we covered during our consultation:  - Dermatofibroma Care:   - Precautions: Avoid picking or shaving over the dermatofibroma on your thigh to prevent enlargement.   - Monitoring: Keep an eye on the nodule, and should it grow significantly or become painful, please return for a follow-up.  - Acne Treatment:   - Medication: Apply Tretinoin cream 0.025% to the affected facial areas at night.   - Application: Use only a pea-sized amount, three times a week (Monday, Wednesday, Friday).   - Moisturizing: Follow up with a moisturizer to prevent dryness.   - Duration: Continue this regimen for three months, at which point we will evaluate its effectiveness and consider additional treatment if necessary.  - Skincare Products:   - Recommendations: Utilize gentle face wash and moisturizers from La Roche-Posay or CeraVe to support your skincare routine without causing dryness.  - Follow-Up Appointment:   - Next Steps: Schedule a return visit in three months to assess progress and adjust treatment as needed.  Please adhere to these instructions carefully and reach out to the office with any questions or concerns. We are looking forward to your next visit and to assisting you further in achieving healthier skin.  Warm regards,  Dr. Langston Reusing [facility]   Important Information  Due to recent changes in healthcare laws, you may see results of your pathology and/or laboratory studies on MyChart before the doctors have had a chance to review them. We understand that in some cases there may be results that are confusing or concerning to you. Please understand that not all results are received at the same time and often the doctors may need to interpret multiple results in order to provide you with the best plan of care or  course of treatment. Therefore, we ask that you please give [place] 2 business days to thoroughly review all your results before contacting the office for clarification. Should we see a critical lab result, you will be contacted sooner.   If You Need Anything After Your Visit  If you have any questions or concerns for your doctor, please call our main line at [phone] If no one answers, please leave a voicemail as directed and we will return your call as soon as possible. Messages left after 4 pm will be answered the following business day.   You may also send [place] a message via MyChart. We typically respond to MyChart messages within 1-2 business days.  For prescription refills, please ask your pharmacy to contact our office. Our fax number is [phone].  If you have an urgent issue when the clinic is closed that cannot wait until the next business day, you can page your doctor at the number below.    Please note that while we do our best to be available for urgent issues outside of office hours, we are not available 24/7.   If you have an urgent issue and are unable to reach [place], you may choose to seek medical care at your doctor's office, retail clinic, urgent care center, or emergency room.  If you have a medical emergency, please immediately call 911 or go to the emergency department. In the event of inclement weather, please call our main line at [phone] for an update on the status of any delays or closures.  Dermatology Medication Tips: Please keep the boxes  that topical medications come in in order to help keep track of the instructions about where and how to use these. Pharmacies typically print the medication instructions only on the boxes and not directly on the medication tubes.   If your medication is too expensive, please contact our office at [phone] or send [place] a message through MyChart.   We are unable to tell what your co-pay for medications will be in advance as  this is different depending on your insurance coverage. However, we may be able to find a substitute medication at lower cost or fill out paperwork to get insurance to cover a needed medication.   If a prior authorization is required to get your medication covered by your insurance company, please allow [place] 1-2 business days to complete this process.  Drug prices often vary depending on where the prescription is filled and some pharmacies may offer cheaper prices.  The website www.goodrx.com contains coupons for medications through different pharmacies. The prices here do not account for what the cost may be with help from insurance (it may be cheaper with your insurance), but the website can give you the price if you did not use any insurance.  - You can print the associated coupon and take it with your prescription to the pharmacy.  - You may also stop by our office during regular business hours and pick up a GoodRx coupon card.  - If you need your prescription sent electronically to a different pharmacy, notify our office through [facility] or by phone at [phone]

## 2023-02-28 ENCOUNTER — Ambulatory Visit: Payer: Medicaid Other | Admitting: Dermatology

## 2023-04-20 ENCOUNTER — Encounter: Payer: Self-pay | Admitting: Dermatology

## 2023-04-20 ENCOUNTER — Ambulatory Visit: Payer: Medicaid Other | Admitting: Dermatology

## 2023-04-20 VITALS — BP 131/87

## 2023-04-20 DIAGNOSIS — L700 Acne vulgaris: Secondary | ICD-10-CM | POA: Diagnosis not present

## 2023-04-20 DIAGNOSIS — L810 Postinflammatory hyperpigmentation: Secondary | ICD-10-CM

## 2023-04-20 MED ORDER — TRETINOIN 0.05 % EX CREA: TOPICAL_CREAM | CUTANEOUS | 0 refills | Status: DC

## 2023-04-20 MED ORDER — SPIRONOLACTONE 100 MG PO TABS: 100.0000 mg | ORAL_TABLET | Freq: Every evening | ORAL | 1 refills | Status: AC

## 2023-04-20 NOTE — Patient Instructions (Addendum)
 Hello Kelsey Swanson,  Thank you for visiting [place] today.   Here is a summary of the key instructions from today's consultation:  - Spironolactone 100 mg: Take one pill daily with dinner.   - Side Effects: Monitor for lightheadedness or dizziness. If these occur, stop taking the medication and contact our office.  - Tretinoin Cream: Increase to a stronger dose.   - Application: Start using it two nights a week. If well-tolerated after two weeks, increase to three nights a week.   - Usage: Use a pea-sized amount and apply moisturizer before and after application to minimize dryness or irritation.  - Moisturizer: Continue to use a moisturizer with sunscreen every morning, especially now that it is getting sunnier.  - Follow-Up: Schedule a follow-up appointment in four months to assess progress. We aim for clear or almost clear skin by then, and will adjust the treatment plan if necessary.  - Prescription Details: Both prescriptions have been sent to your regular pharmacy with a three-month supply and three refills for the spironolactone.  We look forward to seeing the positive changes in your next visit. If you have any questions or concerns before then, please do not hesitate to contact our office.  Warm regards,  Dr. Langston Reusing, Dermatology    Important Information  Due to recent changes in healthcare laws, you may see results of your pathology and/or laboratory studies on MyChart before the doctors have had a chance to review them. We understand that in some cases there may be results that are confusing or concerning to you. Please understand that not all results are received at the same time and often the doctors may need to interpret multiple results in order to provide you with the best plan of care or course of treatment. Therefore, we ask that you please give [place] 2 business days to thoroughly review all your results before contacting the office for clarification. Should we see a critical  lab result, you will be contacted sooner.   If You Need Anything After Your Visit  If you have any questions or concerns for your doctor, please call our main line at [phone] If no one answers, please leave a voicemail as directed and we will return your call as soon as possible. Messages left after 4 pm will be answered the following business day.   You may also send [place] a message via MyChart. We typically respond to MyChart messages within 1-2 business days.  For prescription refills, please ask your pharmacy to contact our office. Our fax number is [phone].  If you have an urgent issue when the clinic is closed that cannot wait until the next business day, you can page your doctor at the number below.    Please note that while we do our best to be available for urgent issues outside of office hours, we are not available 24/7.   If you have an urgent issue and are unable to reach [place], you may choose to seek medical care at your doctor's office, retail clinic, urgent care center, or emergency room.  If you have a medical emergency, please immediately call 911 or go to the emergency department. In the event of inclement weather, please call our main line at [phone] for an update on the status of any delays or closures.  Dermatology Medication Tips: Please keep the boxes that topical medications come in in order to help keep track of the instructions about where and how to use these. Pharmacies typically print the  medication instructions only on the boxes and not directly on the medication tubes.   If your medication is too expensive, please contact our office at [phone] or send [place] a message through MyChart.   We are unable to tell what your co-pay for medications will be in advance as this is different depending on your insurance coverage. However, we may be able to find a substitute medication at lower cost or fill out paperwork to get insurance to cover a needed  medication.   If a prior authorization is required to get your medication covered by your insurance company, please allow [place] 1-2 business days to complete this process.  Drug prices often vary depending on where the prescription is filled and some pharmacies may offer cheaper prices.  The website www.goodrx.com contains coupons for medications through different pharmacies. The prices here do not account for what the cost may be with help from insurance (it may be cheaper with your insurance), but the website can give you the price if you did not use any insurance.  - You can print the associated coupon and take it with your prescription to the pharmacy.  - You may also stop by our office during regular business hours and pick up a GoodRx coupon card.  - If you need your prescription sent electronically to a different pharmacy, notify our office through [facility] or by phone at [phone]

## 2023-04-20 NOTE — Progress Notes (Signed)
   Follow-Up Visit   Subjective  Kelsey Swanson is a 53 y.o. female who presents for the following: Acne  Patient present today for follow up visit. Patient was last evaluated on 11/22/22. At this visit patient was prescribed Tretinoin 0.025% - apply pea size on M W F . Patient reports sxs are unchanged. Patient denies medication changes.  The following portions of the chart were reviewed this encounter and updated as appropriate: medications, allergies, medical history  Review of Systems:  No other skin or systemic complaints except as noted in HPI or Assessment and Plan.  Objective  Well appearing patient in no apparent distress; mood and affect are within normal limits.   A focused examination was performed of the following areas: face   Relevant exam findings are noted in the Assessment and Plan.           Assessment & Plan   ACNE VULGARIS and PIH Exam: Open comedones and inflammatory papules  - Assessment: Patient has been using tretinoin cream 3 nights a week since September with no reported dryness or irritation. However, the current topical treatment alone is not providing sufficient clearance of acne. The clinician has determined that a combination of oral and topical therapy is necessary for more effective treatment.  - Plan:    Initiate spironolactone 100 mg orally once daily with dinner    Increase tretinoin cream strength (double the current strength)    Use new tretinoin cream 2 nights per week initially, increasing to 3 nights per week after 2 weeks if tolerated    Apply moisturizer before and after tretinoin application    Continue use of sunscreen-containing moisturizer in the morning    Prescribe 55-month supply of spironolactone with 3 refills    Follow up in 4 months    Monitor for side effects of spironolactone, particularly lightheadedness or dizziness    Adjust tretinoin frequency based on skin dryness or irritation    ACNE VULGARIS   Related  Medications spironolactone (ALDACTONE) 100 MG tablet Take 1 tablet (100 mg total) by mouth at bedtime. tretinoin (RETIN-A) 0.05 % cream Apply pea sized amount to face on M-W-F at bedtime  No follow-ups on file.    Documentation: I have reviewed the above documentation for accuracy and completeness, and I agree with the above.  I, Shirron Marcha Solders, CMA, am acting as scribe for [employer], DO.   Langston Reusing, DO

## 2023-05-16 ENCOUNTER — Ambulatory Visit: Payer: Medicaid Other | Attending: Internal Medicine | Admitting: Internal Medicine

## 2023-05-16 ENCOUNTER — Encounter: Payer: Self-pay | Admitting: Internal Medicine

## 2023-05-16 VITALS — BP 103/66 | HR 76 | Temp 98.2°F | Ht 63.0 in | Wt 187.0 lb

## 2023-05-16 DIAGNOSIS — R1011 Right upper quadrant pain: Secondary | ICD-10-CM

## 2023-05-16 DIAGNOSIS — R221 Localized swelling, mass and lump, neck: Secondary | ICD-10-CM | POA: Diagnosis not present

## 2023-05-16 DIAGNOSIS — L700 Acne vulgaris: Secondary | ICD-10-CM | POA: Diagnosis not present

## 2023-05-16 DIAGNOSIS — R42 Dizziness and giddiness: Secondary | ICD-10-CM | POA: Diagnosis not present

## 2023-05-16 DIAGNOSIS — R1013 Epigastric pain: Secondary | ICD-10-CM

## 2023-05-16 DIAGNOSIS — Z1231 Encounter for screening mammogram for malignant neoplasm of breast: Secondary | ICD-10-CM

## 2023-05-16 MED ORDER — OMEPRAZOLE 20 MG PO CPDR: 20.0000 mg | DELAYED_RELEASE_CAPSULE | Freq: Every day | ORAL | 3 refills | Status: AC

## 2023-05-16 MED ORDER — TRETINOIN 0.05 % EX CREA: TOPICAL_CREAM | CUTANEOUS | 0 refills | Status: AC

## 2023-05-16 NOTE — Patient Instructions (Signed)
 Porfavor mande el historial de sus vacunas a nuestra oficina. Minna Merritts de fax es [phone].

## 2023-05-16 NOTE — Progress Notes (Signed)
 Patient ID: Jamesyn Moorefield, female    DOB: [birth year]  MRN: [id number]  CC: Mass (Mass on R side of neck, growing in size, frequent dizziness X2 mo/abdominal pain X1 mo)   Subjective: Debby Clyne is a 53 y.o. female who presents for chronic ds management. Her concerns today include:  Patient with history of EtOH abuse,  Fatty liver, HL, CTS and alcoholic pancreatitis, chronic migraine HA, low PLT.   Pt speaks english.  Discussed the use of AI scribe software for clinical note transcription with the patient, who gave verbal consent to proceed.  History of Present Illness Emilee Takoya Jonas is a 53 year old female who presents with a neck mass and abdominal pain.  A mass on the right side of the neck, below the jaw at the angle, has been present for more than a month. It is non-painful, which initially led to inattention, but has increased in size over time. There is no associated redness or difficulty swallowing.  Abdominal pain:  Abdominal pain began about a month ago, occurring after eating any type of food, typically starting three to five minutes post-ingestion and lasting for about thirty minutes. The pain is not associated with nausea or vomiting, but bloating and gas are present. The pain is primarily located in the right upper to mid quadrant and sometimes on the left mid lower side. She takes Tylenol, usually two tablets, which sometimes alleviates the pain. The pain occurs about four times a week. She has a history of fatty liver and alcohol-induced pancreatitis but has abstained from alcohol for more than five years. Her gallbladder is intact, and she occasionally takes omeprazole for acid reflux, particularly when consuming spicy foods.  Dizziness:  Dizziness and weakness have been occurring for the past five to six weeks, almost daily, lasting about three minutes. The dizziness is more frequent now and occurs with position changes, such as during housework or cooking and  when going from sitting to standing. She drinks six to eight twelve-ounce bottles of water daily. Menstrual cycles last seven to eight days, with very heavy bleeding during the first three to four days, requiring pad changes every twenty minutes at times.  Request refill on Retin-A for acne.  HM: Reports having had flu and Shingrix vaccine series through her [place] pharmacy.  Due for mammogram.  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     naproxen (NAPROSYN) 500 MG tablet Take 1 tablet (500 mg total) by mouth 2 (two) times daily as needed (take with food). 40 tablet 1   spironolactone (ALDACTONE) 100 MG tablet Take 1 tablet (100 mg total) by mouth at  bedtime. 90 tablet 1   SUMAtriptan (IMITREX) 50 MG tablet 1 tab PO at start of headache.  May repeat in 2 hrs if no relief.  Max 2 tabs/24 hrs 10 tablet 1   topiramate (TOPAMAX) 25 MG tablet 1 tab PO QHS x 1 wk then 2 tabs PO QHS 60 tablet 2   No current facility-administered medications on file prior to visit.    Allergies  Allergen Reactions   Doxycycline Shortness Of Breath and Swelling     Facial swelling also caused blisters in her mouth   Metronidazole Shortness Of Breath and Swelling    Facial swelling and blisters in her mouth    Social History   Socioeconomic History   Marital status: Married    Spouse name: Not on file   Number of children: Not on file   Years of education: Not on file   Highest education level: 6th grade  Occupational History   Occupation: unemployed  Tobacco Use   Smoking status: Former    Current packs/day: 0.00    Average packs/day: 0.3 packs/day for 10.0 years (3.0 ttl pk-yrs)    Types: Cigarettes    Start date: 06/06/2005    Quit date: 06/07/2015    Years since quitting: 7.9   Smokeless tobacco: Never  Vaping Use   Vaping status: Never Used  Substance and Sexual Activity   Alcohol use: Not Currently    Comment:  May 2020   Drug use: No   Sexual activity: Yes    Partners: Male    Birth control/protection: Surgical  Other Topics Concern   Not on file  Social History Narrative   Are you right handed or left handed? Right   Are you currently employed ? no   Caffeine occas.   Do you live at home alone?no   Who lives with you? Husband and 2 kids   What type of home do you live in: 1 story or 2 story? Two story       Social Drivers of Corporate investment banker Strain: Low Risk  (05/16/2023)   Overall Financial Resource Strain (CARDIA)    Difficulty of Paying Living Expenses: Not hard at all  Food Insecurity: No Food Insecurity (05/16/2023)   Hunger Vital Sign    Worried About Running Out of Food in the Last Year: Never true    Ran Out of Food in the Last Year: Never true  Transportation Needs: Unknown (05/16/2023)   PRAPARE - Administrator, Civil Service (Medical): Patient declined    Lack of Transportation (Non-Medical): No  Physical Activity: Insufficiently Active (05/16/2023)   Exercise Vital Sign    Days of Exercise per Week: 2 days    Minutes of Exercise per Session: 20 min  Stress: Stress Concern Present  (05/16/2023)   [employer] of Occupational Health - Occupational Stress Questionnaire    Feeling of Stress : To some extent  Social Connections: Unknown (05/16/2023)   Social Connection and Isolation Panel [NHANES]    Frequency of Communication with Friends and Family: More than three times a week    Frequency of Social Gatherings with Friends and Family: Once a week    Attends Religious Services: More than 4 times per year    Active Member of [employer] or Organizations: Patient declined    Attends Banker Meetings: Not on file    Marital Status: Married  Catering manager Violence: Not on file    Family History  Problem  Relation Age of Onset   Diabetes Mother    Diabetes Father    Diabetes Sister    Diabetes Maternal Aunt    Ovarian cancer Maternal Aunt    Diabetes Maternal Uncle    Diabetes Maternal Grandmother    Breast cancer Neg Hx    Colon cancer Neg Hx    Rectal cancer Neg Hx    Stomach cancer Neg Hx     Past Surgical History:  Procedure Laterality Date   BREAST EXCISIONAL BIOPSY Right    BREAST SURGERY     right breast   COLONOSCOPY     ECTOPIC PREGNANCY SURGERY  2012   TUBAL LIGATION Bilateral 03/14/2019   Procedure: POST PARTUM TUBAL LIGATION;  Surgeon: Federico Flake, MD;  Location: [facility] LD ORS;  Service: Gynecology;  Laterality: Bilateral;    ROS: Review of Systems Negative except as stated above  PHYSICAL EXAM: BP 103/66 (BP Location: Left Arm, Patient Position: Sitting, Cuff Size: Large)   Pulse 76   Temp 98.2 F (36.8 C) (Oral)   Ht 5\' 3"  (1.6 m)   Wt 187 lb (84.8 kg)   LMP 04/18/2023   SpO2 96%   BMI 33.13 kg/m   Physical Exam  General appearance - alert, well appearing, and in no distress Mental status - normal mood, behavior, speech, dress, motor activity, and thought processes Neck -No cervical lymphadenopathy appreciated.  She has questionable 2 cm firmness felt medial to the submandibular gland on the right side.  No  thyromegaly. Chest - clear to auscultation, no wheezes, rales or rhonchi, symmetric air entry Heart - normal rate, regular rhythm, normal S1, S2, no murmurs, rubs, clicks or gallops Abdomen - soft, nontender, nondistended, no masses or organomegaly Extremities - peripheral pulses normal, no pedal edema, no clubbing or cyanosis Neuro: Cranial nerves grossly intact.  Power 5/5 in all 4 extremities.  Gross sensation intact.  Gait is stable and normal.     Latest Ref Rng & Units 04/27/2022   10:17 AM 02/19/2022   10:47 AM 01/18/2022    5:59 PM  CMP  Glucose 70 - 99 mg/dL 98     BUN 6 - 20 mg/dL 12     Creatinine 2.13 - 1.00 mg/dL 0.86   5.78   Sodium 469 - 145 mmol/L 140     Potassium 3.5 - 5.1 mmol/L 3.9     Chloride 98 - 111 mmol/L 105     CO2 22 - 32 mmol/L 29     Calcium 8.9 - 10.3 mg/dL 9.7     Total Protein 6.5 - 8.1 g/dL 7.7  7.5    Total Bilirubin 0.3 - 1.2 mg/dL 0.4  0.4    Alkaline Phos 38 - 126 U/L 70  108    AST 15 - 41 U/L 17  20    ALT 0 - 44 U/L 35  48     Lipid Panel     Component Value Date/Time   CHOL 202 (H) 02/19/2022 1047   TRIG 207 (H) 02/19/2022 1047   HDL 38 (L) 02/19/2022 1047   CHOLHDL 5.3 (H) 02/19/2022 1047   CHOLHDL 4.4 09/02/2017 1132   VLDL 27 09/02/2017 1132   LDLCALC 127 (H) 02/19/2022 1047    CBC    Component Value Date/Time   WBC 5.9 04/27/2022 1017   WBC 7.0 12/03/2021 1735   RBC 4.28 04/27/2022 1017   HGB 12.5 04/27/2022 1017   HGB 12.7 12/24/2021 1006   HCT 37.3  04/27/2022 1017   HCT 37.4 12/24/2021 1006   PLT 262 04/27/2022 1017   PLT 44 (LL) 12/24/2021 1006   MCV 87.1 04/27/2022 1017   MCV 86 12/24/2021 1006   MCH 29.2 04/27/2022 1017   MCHC 33.5 04/27/2022 1017   RDW 15.1 04/27/2022 1017   RDW 12.7 12/24/2021 1006   LYMPHSABS 2.6 04/27/2022 1017   LYMPHSABS 2.3 09/19/2018 1409   MONOABS 0.3 04/27/2022 1017   EOSABS 0.2 04/27/2022 1017   EOSABS 0.1 09/19/2018 1409   BASOSABS 0.0 04/27/2022 1017   BASOSABS 0.0 09/19/2018  1409    ASSESSMENT AND PLAN: 1. Right upper quadrant abdominal pain (Primary) We will check chemistry including LFTs. Differential includes gallbladder disease versus gastritis versus H. pylori.  Stop Naprosyn.  Change omeprazole from as needed to daily use.  Will get a gallbladder ultrasound.  If negative, consider CAT scan of the abdomen versus referral to gastroenterology. -Screen for H. pylori. - US Abdomen Limited RUQ (LIVER/GB); Future  2. Neck mass ? Non-tender mass on the right side of the neck; not sure if this is an extension of the salivary gland.  Definitely does not involve the thyroid gland.   - CT Soft Tissue Neck W Contrast; Future  3. Dizziness Differential diagnosis include anemia versus dehydration versus neurologic issue.  Advised to continue drinking at least 4 to 8 glasses of water daily.  Go slow with position changes.  We will check a CBC and chemistry today - CBC - Comprehensive metabolic panel  4. Acne vulgaris Patient requested refill on Retin-A. - tretinoin (RETIN-A) 0.05 % cream; Apply pea sized amount to face on M-W-F at bedtime  Dispense: 45 g; Refill: 0  5. Dyspepsia - H. pylori breath test  6.  Breast cancer screening Referred for mammogram. Patient was given the opportunity to ask questions.  Patient verbalized understanding of the plan and was able to repeat key elements of the plan.   This documentation was completed using Paediatric nurse.  Any transcriptional errors are unintentional.    Requested Prescriptions   Signed Prescriptions Disp Refills   omeprazole (PRILOSEC) 20 MG capsule 30 capsule 3    Sig: Take 1 capsule (20 mg total) by mouth daily.   tretinoin (RETIN-A) 0.05 % cream 45 g 0    Sig: Apply pea sized amount to face on M-W-F at bedtime    Return in about 2 months (around 07/16/2023).  Jonah Blue, MD, FACP

## 2023-05-18 ENCOUNTER — Encounter: Payer: Self-pay | Admitting: Internal Medicine

## 2023-05-18 LAB — COMPREHENSIVE METABOLIC PANEL
ALT: 31 IU/L (ref 0–32)
AST: 14 IU/L (ref 0–40)
Albumin: 4.6 g/dL (ref 3.9–4.9)
Alkaline Phosphatase: 66 IU/L (ref 44–121)
BUN/Creatinine Ratio: 20 (ref 9–23)
BUN: 12 mg/dL (ref 6–24)
Bilirubin Total: 0.3 mg/dL (ref 0.0–1.2)
CO2: 25 mmol/L (ref 20–29)
Calcium: 9.5 mg/dL (ref 8.7–10.2)
Chloride: 100 mmol/L (ref 96–106)
Creatinine, Ser: 0.61 mg/dL (ref 0.57–1.00)
Globulin, Total: 2.4 g/dL (ref 1.5–4.5)
Glucose: 139 mg/dL — ABNORMAL HIGH (ref 70–99)
Potassium: 4.7 mmol/L (ref 3.5–5.2)
Sodium: 138 mmol/L (ref 134–144)
Total Protein: 7 g/dL (ref 6.0–8.5)
eGFR: 109 mL/min/{1.73_m2} (ref >59)

## 2023-05-18 LAB — CBC
Hematocrit: 39.9 % (ref 34.0–46.6)
Hemoglobin: 13 g/dL (ref 11.1–15.9)
MCH: 27.9 pg (ref 26.6–33.0)
MCHC: 32.6 g/dL (ref 31.5–35.7)
MCV: 86 fL (ref 79–97)
Platelets: 222 10*3/uL (ref 150–450)
RBC: 4.66 x10E6/uL (ref 3.77–5.28)
RDW: 13.1 % (ref 11.7–15.4)
WBC: 7.7 10*3/uL (ref 3.4–10.8)

## 2023-05-18 LAB — H. PYLORI BREATH COLLECTION

## 2023-05-18 LAB — H. PYLORI BREATH TEST

## 2023-05-19 ENCOUNTER — Encounter: Payer: Self-pay | Admitting: Internal Medicine

## 2023-05-24 ENCOUNTER — Other Ambulatory Visit: Payer: Self-pay | Admitting: Family Medicine

## 2023-05-24 ENCOUNTER — Ambulatory Visit
Admission: RE | Admit: 2023-05-24 | Discharge: 2023-05-24 | Disposition: A | Discharge status: Home / Self Care | Source: Ambulatory Visit | Attending: Internal Medicine | Admitting: Internal Medicine

## 2023-05-24 DIAGNOSIS — R1011 Right upper quadrant pain: Secondary | ICD-10-CM | POA: Diagnosis not present

## 2023-05-24 DIAGNOSIS — K760 Fatty (change of) liver, not elsewhere classified: Secondary | ICD-10-CM | POA: Diagnosis not present

## 2023-05-24 DIAGNOSIS — K8020 Calculus of gallbladder without cholecystitis without obstruction: Secondary | ICD-10-CM | POA: Diagnosis not present

## 2023-05-24 DIAGNOSIS — R221 Localized swelling, mass and lump, neck: Secondary | ICD-10-CM | POA: Diagnosis not present

## 2023-05-24 MED ORDER — IOPAMIDOL (ISOVUE-300) INJECTION 61%
200.0000 mL | Freq: Once | INTRAVENOUS | Status: AC | PRN
  Administered 2023-05-24: 75 mL via INTRAVENOUS

## 2023-06-08 DIAGNOSIS — K8020 Calculus of gallbladder without cholecystitis without obstruction: Secondary | ICD-10-CM | POA: Diagnosis not present

## 2023-06-19 ENCOUNTER — Encounter: Payer: Self-pay | Admitting: Internal Medicine

## 2023-07-15 ENCOUNTER — Ambulatory Visit: Admitting: Internal Medicine

## 2023-07-19 ENCOUNTER — Telehealth: Payer: Self-pay

## 2023-07-19 NOTE — Telephone Encounter (Signed)
 Patient needs to be rescheduled.    Copied from CRM [id number]. Topic: General - Other >> Jul 15, 2023  4:19 PM Sophia H wrote: Reason for CRM: Pt states she had a missed call, stated on Wednesday when office called for confirmation of todays appt she advs that she would not be able to come in and she would cb to reschedule. Looks like appt is marked as no show, unsure if missed call was regarding that since there are no notes in pt's chart. **Spanish caller

## 2023-07-20 NOTE — Telephone Encounter (Signed)
 Hey could you call this pt resch

## 2023-09-01 ENCOUNTER — Ambulatory Visit: Payer: Medicaid Other | Admitting: Dermatology

## 2023-09-22 ENCOUNTER — Ambulatory Visit: Admitting: Dermatology

## 2023-09-22 DIAGNOSIS — L810 Postinflammatory hyperpigmentation: Secondary | ICD-10-CM | POA: Diagnosis not present

## 2023-09-22 DIAGNOSIS — L700 Acne vulgaris: Secondary | ICD-10-CM

## 2023-09-22 DIAGNOSIS — L709 Acne, unspecified: Secondary | ICD-10-CM

## 2023-09-22 MED ORDER — SAFETY SEAL MISCELLANEOUS MISC: 1.0000 | Freq: Every evening | 3 refills | Status: DC

## 2023-09-22 NOTE — Patient Instructions (Signed)
 Date: Thu Sep 22 2023  Hello Candid,  Thank you for visiting today. Here is a summary of the key instructions:  - Medications:   - Continue taking spironolactone  100mg  every night with dinner   - Use tretinoin  3 nights a week (Monday, Wednesday, Friday)   - Apply hydroquinone 12% with kojic acid cream on nights when not using tretinoin      - Apply only to dark areas on face     - Use for up to 4 months, then stop  - Skin Care Routine:   - Morning:     - Wash face     - Apply sunscreen     - Use La Roche-Posay Effaclar Medicated Gel Cleanser (salicylic acid wash)   - Evening:     - Wash face     - Apply tretinoin  or hydroquinone cream as directed  - New Prescription:   - Hydroquinone 12% with kojic acid cream     - Will be sent to [facility] pharmacy     - Costs $45 (not covered by insurance)     - Pharmacy will call to verify address and collect payment     - Will be mailed to you  - Follow-up:   - Return for follow-up appointment in 4 months   - New photos will be taken at next appointment  Please reach out if you have any questions or concerns.  Warm regards,  Dr. Delon Lenis [facility]   Important Information  Due to recent changes in healthcare laws, you may see results of your pathology and/or laboratory studies on MyChart before the doctors have had a chance to review them. We understand that in some cases there may be results that are confusing or concerning to you. Please understand that not all results are received at the same time and often the doctors may need to interpret multiple results in order to provide you with the best plan of care or course of treatment. Therefore, we ask that you please give us  2 business days to thoroughly review all your results before contacting the office for clarification. Should we see a critical lab result, you will be contacted sooner.   If You Need Anything After Your Visit  If you have any questions or concerns for your  doctor, please call our main line at [phone] If no one answers, please leave a voicemail as directed and we will return your call as soon as possible. Messages left after 4 pm will be answered the following business day.   You may also send us  a message via MyChart. We typically respond to MyChart messages within 1-2 business days.  For prescription refills, please ask your pharmacy to contact our office. Our fax number is [phone].  If you have an urgent issue when the clinic is closed that cannot wait until the next business day, you can page your doctor at the number below.    Please note that while we do our best to be available for urgent issues outside of office hours, we are not available 24/7.   If you have an urgent issue and are unable to reach us , you may choose to seek medical care at your doctor's office, retail clinic, urgent care center, or emergency room.  If you have a medical emergency, please immediately call 911 or go to the emergency department. In the event of inclement weather, please call our main line at [phone] for an update on the status of any delays  or closures.  Dermatology Medication Tips: Please keep the boxes that topical medications come in in order to help keep track of the instructions about where and how to use these. Pharmacies typically print the medication instructions only on the boxes and not directly on the medication tubes.   If your medication is too expensive, please contact our office at [phone] or send us  a message through MyChart.   We are unable to tell what your co-pay for medications will be in advance as this is different depending on your insurance coverage. However, we may be able to find a substitute medication at lower cost or fill out paperwork to get insurance to cover a needed medication.   If a prior authorization is required to get your medication covered by your insurance company, please allow us  1-2 business days  to complete this process.  Drug prices often vary depending on where the prescription is filled and some pharmacies may offer cheaper prices.  The website www.goodrx.com contains coupons for medications through different pharmacies. The prices here do not account for what the cost may be with help from insurance (it may be cheaper with your insurance), but the website can give you the price if you did not use any insurance.  - You can print the associated coupon and take it with your prescription to the pharmacy.  - You may also stop by our office during regular business hours and pick up a GoodRx coupon card.  - If you need your prescription sent electronically to a different pharmacy, notify our office through [facility] or by phone at [phone]

## 2023-09-22 NOTE — Progress Notes (Signed)
   Follow-Up Visit   Subjective  Kelsey Swanson is a 53 y.o. female who presents for the following: Acne Vulgaris  Athalene was last seen on 04/20/23. She was prescribed Spironolactone  100mg  tablets and Tretinoin  0.05%. She was also recommended to use gentle skin care products   Chandler is pleased with her treatment so far. She is following the recommended treatment. There are still some spots flared along her neck but is using gentle skin care products and the Tretinoin  three nights a week. Kose moisturzer from [employer], LaRoche Posay Efeclear cleanser, and [address] sunscreen   The following portions of the chart were reviewed this encounter and updated as appropriate: medications, allergies, medical history  Review of Systems:  No other skin or systemic complaints except as noted in HPI or Assessment and Plan.  Objective  Well appearing patient in no apparent distress; mood and affect are within normal limits.  Areas Examined: Face, chest and back  Relevant exam findings are noted in the Assessment and Plan.   Assessment & Plan   1. Acne - Assessment: Patient reports improvement in acne with fewer new outbreaks. Current treatment regimen includes tretinoin  3 nights per week (Monday, Wednesday, Friday) and spironolactone  daily. Skin appearance has improved significantly, with brighter skin and better overall condition compared to previous visit. Patient is using Therapist, nutritional (salicylic acid) for cleansing, and Leonce Stuart Hammonds with an additional [place] brand moisturizer from [employer] for skincare.  - Plan:    Continue current treatment regimen:     - Tretinoin  3 nights per week (Monday, Wednesday, Friday)     - Spironolactone  daily with dinner    Maintain current skincare routine:     - Morning: La Roche-Posay Effaclar [employer], sunscreen/moisturizer     - Evening: Cleanse face    Prescribe hydroquinone 12% with kojic acid cream for dark spots:      - Apply to dark areas on alternating nights when not using tretinoin      - Use for up to 4 months, then discontinue     - Prescription to be filled at [facility] pharmacy, cost $45 (not covered by insurance)  Follow-up in 4 months for reassessment and photos.  2. Hyperpigmentation - Assessment: Patient reports concerns about dark marks on the skin, likely post-inflammatory hyperpigmentation secondary to acne. Current acne treatment has improved overall skin condition, but targeted treatment for hyperpigmentation is warranted.  - Plan:    Prescribe hydroquinone 12% with kojic acid cream:     - Apply as spot treatment to dark areas on nights when not using tretinoin      - Use for up to 4 months, then discontinue     - Prescription to be filled at [facility] pharmacy, cost $45 (not covered by insurance)    Educate patient on proper application and potential side effects    Emphasize importance of sun protection  Follow-up in 4 months or sooner if dark spots resolve.     No follow-ups on file.  I, Gordan Beams, CMA, am acting as scribe for [employer], DO.   Documentation: I have reviewed the above documentation for accuracy and completeness, and I agree with the above.  Delon Lenis, DO

## 2023-10-02 ENCOUNTER — Encounter: Payer: Self-pay | Admitting: Dermatology

## 2024-01-09 ENCOUNTER — Ambulatory Visit: Admitting: Dermatology

## 2024-01-09 ENCOUNTER — Encounter: Payer: Self-pay | Admitting: Dermatology

## 2024-01-09 VITALS — BP 113/76

## 2024-01-09 DIAGNOSIS — L819 Disorder of pigmentation, unspecified: Secondary | ICD-10-CM | POA: Diagnosis not present

## 2024-01-09 DIAGNOSIS — L710 Perioral dermatitis: Secondary | ICD-10-CM | POA: Diagnosis not present

## 2024-01-09 DIAGNOSIS — T364X5A Adverse effect of tetracyclines, initial encounter: Secondary | ICD-10-CM | POA: Diagnosis not present

## 2024-01-09 DIAGNOSIS — L700 Acne vulgaris: Secondary | ICD-10-CM

## 2024-01-09 DIAGNOSIS — L810 Postinflammatory hyperpigmentation: Secondary | ICD-10-CM

## 2024-01-09 MED ORDER — PIMECROLIMUS 1 % EX CREA: TOPICAL_CREAM | Freq: Two times a day (BID) | CUTANEOUS | 2 refills | Status: AC

## 2024-01-09 NOTE — Progress Notes (Signed)
   Follow-Up Visit   Subjective  Kelsey Swanson is a 53 y.o. female established patient who presents for FOLLOW UP on the diagnoses listed below:  Patient was last evaluated on 09/22/23.   Acne w/ PIH: Prescribed Tretinoin  3 nights a week MWF, Spirolactone 100mg  daily, medrock hydroquinone 12% to use on alternate nights of tretinoin . Patient reports sxs are better but 2 weeks ago she did notice some small bumps coming around the mouth that was itchy. She said they have resolved now but wanted DO to be aware.    Are you nursing, pregnant or trying to conceive? No   The following portions of the chart were reviewed this encounter and updated as appropriate: medications, allergies, medical history  Review of Systems:  No other skin or systemic complaints except as noted in HPI or Assessment and Plan.  Objective  Well appearing patient in no apparent distress; mood and affect are within normal limits.   A focused examination was performed of the following areas: face   Relevant exam findings are noted in the Assessment and Plan.           Assessment & Plan    Perioral dermatitis Pink papules coalescing around the nose and mouth, consistent with perioral dermatitis. Likely exacerbated by makeup use and possibly hormonal changes. Doxycycline  was considered but not prescribed due to allergy. - Prescribed pimecrolimus cream to be applied daily to affected areas. - Advised against using tretinoin  on affected areas to prevent triggering dermatitis. - Provided samples of Avene Ciclophate moisturizer for nighttime use.  Acne vulgaris Continue spironolactone  for acne management. - Continue tretinoin  three nights a week, with the option to reduce to two nights if skin becomes dry during winter. - Apply Avene Ciclophate moisturizer on top of tretinoin  application.  Facial hyperpigmentation Managed with hydroquinone cream, which is being discontinued to prevent worsening  darkening. Thioguanine cream is introduced as an alternative due to its non-irritating properties and similar mechanism of action to hydroquinone. - Discontinued hydroquinone cream. - Initiated thioguanine cream (Eucerin Radiant Tone) to be applied morning and night.  Allergy to doxycycline  Causing facial swelling and blisters in the mouth. Doxycycline  was considered for perioral dermatitis but not prescribed due to this allergy. - Avoid prescribing doxycycline  due to allergy.   No follow-ups on file.   Documentation: I have reviewed the above documentation for accuracy and completeness, and I agree with the above.  I, Shirron Maranda, CMA, am acting as scribe for [employer], DO.   Delon Lenis, DO

## 2024-01-09 NOTE — Patient Instructions (Addendum)
 VISIT SUMMARY:  Today, we addressed your concerns about perioral dermatitis, acne, and facial hyperpigmentation. We discussed your current treatments and made some adjustments to better manage your conditions.  YOUR PLAN:  -PERIORAL DERMATITIS: Perioral dermatitis is a facial rash that tends to occur around the mouth. It can be triggered by makeup and hormonal changes. We have prescribed pimecrolimus cream to be applied daily to the affected areas. Please avoid using tretinoin  on these areas to prevent further irritation. Additionally, we provided samples of Avene Ciclophate moisturizer for nighttime use.  -ACNE VULGARIS: Acne vulgaris is a common skin condition that causes pimples. You should continue taking spironolactone  and using tretinoin  three nights a week. If your skin becomes dry during the winter, you can reduce the tretinoin  application to two nights a week. Apply Avene Ciclophate moisturizer on top of the tretinoin  to help with dryness.  -FACIAL HYPERPIGMENTATION: Facial hyperpigmentation is the darkening of the skin, often caused by sun exposure or inflammation. We are discontinuing the hydroquinone cream to prevent worsening of the dark spots. Instead, you will start using thioguanine cream (Eucerin Radiant Tone) both in the morning and at night.  -ALLERGY TO DOXYCYCLINE : You have an allergy to doxycycline , which causes facial swelling and blisters in the mouth. Because of this, we will avoid prescribing doxycycline  for your perioral dermatitis.  INSTRUCTIONS:  Please follow up with us  if you experience any side effects from the new medications or if your symptoms do not improve. Continue with your current skincare routine as adjusted today, and let us  know if you have any questions or concerns.    Important Information  Due to recent changes in healthcare laws, you may see results of your pathology and/or laboratory studies on MyChart before the doctors have had a chance to  review them. We understand that in some cases there may be results that are confusing or concerning to you. Please understand that not all results are received at the same time and often the doctors may need to interpret multiple results in order to provide you with the best plan of care or course of treatment. Therefore, we ask that you please give us  2 business days to thoroughly review all your results before contacting the office for clarification. Should we see a critical lab result, you will be contacted sooner.   If You Need Anything After Your Visit  If you have any questions or concerns for your doctor, please call our main line at [phone] If no one answers, please leave a voicemail as directed and we will return your call as soon as possible. Messages left after 4 pm will be answered the following business day.   You may also send us  a message via MyChart. We typically respond to MyChart messages within 1-2 business days.  For prescription refills, please ask your pharmacy to contact our office. Our fax number is [phone].  If you have an urgent issue when the clinic is closed that cannot wait until the next business day, you can page your doctor at the number below.    Please note that while we do our best to be available for urgent issues outside of office hours, we are not available 24/7.   If you have an urgent issue and are unable to reach us , you may choose to seek medical care at your doctor's office, retail clinic, urgent care center, or emergency room.  If you have a medical emergency, please immediately call 911 or go to the emergency department.  In the event of inclement weather, please call our main line at [phone] for an update on the status of any delays or closures.  Dermatology Medication Tips: Please keep the boxes that topical medications come in in order to help keep track of the instructions about where and how to use these. Pharmacies typically print  the medication instructions only on the boxes and not directly on the medication tubes.   If your medication is too expensive, please contact our office at [phone] or send us  a message through MyChart.   We are unable to tell what your co-pay for medications will be in advance as this is different depending on your insurance coverage. However, we may be able to find a substitute medication at lower cost or fill out paperwork to get insurance to cover a needed medication.   If a prior authorization is required to get your medication covered by your insurance company, please allow us  1-2 business days to complete this process.  Drug prices often vary depending on where the prescription is filled and some pharmacies may offer cheaper prices.  The website www.goodrx.com contains coupons for medications through different pharmacies. The prices here do not account for what the cost may be with help from insurance (it may be cheaper with your insurance), but the website can give you the price if you did not use any insurance.  - You can print the associated coupon and take it with your prescription to the pharmacy.  - You may also stop by our office during regular business hours and pick up a GoodRx coupon card.  - If you need your prescription sent electronically to a different pharmacy, notify our office through [facility] or by phone at [phone]

## 2024-02-29 ENCOUNTER — Ambulatory Visit: Payer: Self-pay

## 2024-02-29 NOTE — Telephone Encounter (Signed)
 FYI Only or Action Required?: FYI only for provider: appointment scheduled on 04/03/24.  Patient was last seen in primary care on 05/16/2023 by Vicci Barnie NOVAK, MD.  Called Nurse Triage reporting Leg Swelling.  Symptoms began several weeks ago.  Interventions attempted: OTC medications: topical relief.  Symptoms are: gradually improving.  Triage Disposition: See PCP Within 2 Weeks  Patient/caregiver understands and will follow disposition?: Yes    Copied from CRM #[id number]. Topic: Clinical - Red Word Triage >> Feb 29, 2024 11:32 AM Avram MATSU wrote: Red Word that prompted transfer to Nurse Triage: pain everyday in legs with swelling, dizziness and tired.     Reason for Disposition  Knee swelling is a chronic symptom (recurrent or ongoing AND present > 4 weeks)  Answer Assessment - Initial Assessment Questions Pt called in reporting R knee swelling and pain that has been persistent for several weeks; pt reports hx of knee pain. Pt reports pain and swelling was worse but today currently 5/10 and would like to be evaluated by PCP. Pt denies any fever, no s/s of infection. Pt reports pain and swelling is worse with exertion, especially after stairs. Pt reports d/t compensation she is starting to have L knee pain. Pt has been using otc topical without much relief. Discussed addition of ice, elevation, ibuprofen  for swelling and tylenol  for pain. Pt agreeable to home care until PCP appt; pt only wants to see PCP. Discussed next available appt 02/10 and pt placed on wait list. Appointment scheduled for evaluation. Patient agrees with plan of care, and will call back if anything changes, or if symptoms worsen.     1. LOCATION: Where is the swelling located?  (e.g., left, right, both knees)     R knee   2. ONSET: When did the swelling start? Does it come and go, or is it there all the time?     Several weeks ago; pt reports several years ago she had similar knee pain and swelling    3. SWELLING: How bad is the swelling? Or, How large is it? (e.g., mild, moderate, severe; size of localized swelling)      Mild   4. PAIN: Is there any pain? If Yes, ask: How bad is it? (Scale 0-10; or none, mild, moderate, severe)     Yes; 5   5. SETTING: Has there been any recent work, exercise or other activity that involved that part of the body?      No  6. AGGRAVATING FACTORS: What makes the knee swelling worse? (e.g., walking, climbing stairs, running)     Stairs   7. ASSOCIATED SYMPTOMS: Is there any pain or redness?     Pain; no redness   8. OTHER SYMPTOMS: Do you have any other symptoms? (e.g., calf pain, chest pain, difficulty breathing, fever)     None  Protocols used: Knee Swelling-A-AH

## 2024-03-01 ENCOUNTER — Telehealth: Payer: Self-pay

## 2024-03-01 NOTE — Telephone Encounter (Signed)
 Patient did not want to see anyone but Dr Vicci. Made appointment for first available.

## 2024-03-14 ENCOUNTER — Ambulatory Visit: Admitting: *Deleted

## 2024-03-26 ENCOUNTER — Ambulatory Visit: Admitting: Internal Medicine

## 2024-03-27 ENCOUNTER — Ambulatory Visit
Admission: RE | Admit: 2024-03-27 | Discharge: 2024-03-27 | Disposition: A | Source: Ambulatory Visit | Attending: Internal Medicine | Admitting: Internal Medicine

## 2024-03-27 ENCOUNTER — Ambulatory Visit: Admitting: Internal Medicine

## 2024-03-27 ENCOUNTER — Encounter: Payer: Self-pay | Admitting: Internal Medicine

## 2024-03-27 VITALS — BP 114/76 | HR 77 | Ht 63.0 in | Wt 204.0 lb

## 2024-03-27 DIAGNOSIS — R5383 Other fatigue: Secondary | ICD-10-CM | POA: Diagnosis not present

## 2024-03-27 DIAGNOSIS — E66812 Obesity, class 2: Secondary | ICD-10-CM | POA: Diagnosis not present

## 2024-03-27 DIAGNOSIS — Z23 Encounter for immunization: Secondary | ICD-10-CM | POA: Diagnosis not present

## 2024-03-27 DIAGNOSIS — F320 Major depressive disorder, single episode, mild: Secondary | ICD-10-CM | POA: Diagnosis not present

## 2024-03-27 DIAGNOSIS — G4709 Other insomnia: Secondary | ICD-10-CM

## 2024-03-27 DIAGNOSIS — G8929 Other chronic pain: Secondary | ICD-10-CM

## 2024-03-27 DIAGNOSIS — Z1231 Encounter for screening mammogram for malignant neoplasm of breast: Secondary | ICD-10-CM

## 2024-03-27 DIAGNOSIS — M25561 Pain in right knee: Secondary | ICD-10-CM | POA: Diagnosis not present

## 2024-03-27 DIAGNOSIS — N938 Other specified abnormal uterine and vaginal bleeding: Secondary | ICD-10-CM

## 2024-03-27 DIAGNOSIS — F411 Generalized anxiety disorder: Secondary | ICD-10-CM

## 2024-03-27 DIAGNOSIS — Z6836 Body mass index (BMI) 36.0-36.9, adult: Secondary | ICD-10-CM | POA: Diagnosis not present

## 2024-03-27 MED ORDER — MELOXICAM 15 MG PO TABS: 15.0000 mg | ORAL_TABLET | Freq: Every day | ORAL | 2 refills | Status: AC

## 2024-03-27 MED ORDER — SERTRALINE HCL 25 MG PO TABS: 25.0000 mg | ORAL_TABLET | Freq: Every day | ORAL | 1 refills | Status: AC

## 2024-03-28 ENCOUNTER — Ambulatory Visit: Payer: Self-pay | Admitting: Internal Medicine

## 2024-03-28 ENCOUNTER — Ambulatory Visit
Admission: RE | Admit: 2024-03-28 | Discharge: 2024-03-28 | Disposition: A | Source: Ambulatory Visit | Attending: Internal Medicine | Admitting: Internal Medicine

## 2024-03-28 DIAGNOSIS — N938 Other specified abnormal uterine and vaginal bleeding: Secondary | ICD-10-CM

## 2024-03-28 DIAGNOSIS — Z1231 Encounter for screening mammogram for malignant neoplasm of breast: Secondary | ICD-10-CM

## 2024-03-28 DIAGNOSIS — N83201 Unspecified ovarian cyst, right side: Secondary | ICD-10-CM

## 2024-03-30 LAB — CBC
Hematocrit: 42.7 % (ref 34.0–46.6)
Hemoglobin: 14 g/dL (ref 11.1–15.9)
MCH: 28.5 pg (ref 26.6–33.0)
MCHC: 32.8 g/dL (ref 31.5–35.7)
MCV: 87 fL (ref 79–97)
Platelets: 260 10*3/uL (ref 150–450)
RBC: 4.91 x10E6/uL (ref 3.77–5.28)
RDW: 12.8 % (ref 11.7–15.4)
WBC: 7.1 10*3/uL (ref 3.4–10.8)

## 2024-03-30 LAB — COMPREHENSIVE METABOLIC PANEL WITH GFR
ALT: 20 [IU]/L (ref 0–32)
AST: 13 [IU]/L (ref 0–40)
Albumin: 5 g/dL — ABNORMAL HIGH (ref 3.8–4.9)
Alkaline Phosphatase: 69 [IU]/L (ref 49–135)
BUN/Creatinine Ratio: 19 (ref 9–23)
BUN: 11 mg/dL (ref 6–24)
Bilirubin Total: 0.4 mg/dL (ref 0.0–1.2)
CO2: 23 mmol/L (ref 20–29)
Calcium: 9.8 mg/dL (ref 8.7–10.2)
Chloride: 100 mmol/L (ref 96–106)
Creatinine, Ser: 0.57 mg/dL (ref 0.57–1.00)
Globulin, Total: 2.6 g/dL (ref 1.5–4.5)
Glucose: 98 mg/dL (ref 70–99)
Potassium: 4.5 mmol/L (ref 3.5–5.2)
Sodium: 137 mmol/L (ref 134–144)
Total Protein: 7.6 g/dL (ref 6.0–8.5)
eGFR: 110 mL/min/{1.73_m2}

## 2024-03-30 LAB — LIPID PANEL
Chol/HDL Ratio: 4.2 ratio (ref 0.0–4.4)
Cholesterol, Total: 245 mg/dL — ABNORMAL HIGH (ref 100–199)
HDL: 59 mg/dL
LDL Chol Calc (NIH): 157 mg/dL — ABNORMAL HIGH (ref 0–99)
Triglycerides: 160 mg/dL — ABNORMAL HIGH (ref 0–149)
VLDL Cholesterol Cal: 29 mg/dL (ref 5–40)

## 2024-03-30 LAB — FSH/LH
FSH: 3.6 m[IU]/mL
LH: 5.4 m[IU]/mL

## 2024-03-30 LAB — VITAMIN D 25 HYDROXY (VIT D DEFICIENCY, FRACTURES): Vit D, 25-Hydroxy: 41 ng/mL (ref 30.0–100.0)

## 2024-03-30 LAB — TSH+T4F+T3FREE
Free T4: 0.85 ng/dL (ref 0.82–1.77)
T3, Free: 3.3 pg/mL (ref 2.0–4.4)
TSH: 1.82 u[IU]/mL (ref 0.450–4.500)

## 2024-04-03 ENCOUNTER — Ambulatory Visit: Payer: Self-pay | Admitting: Internal Medicine

## 2024-06-25 ENCOUNTER — Other Ambulatory Visit

## 2024-07-09 ENCOUNTER — Ambulatory Visit: Admitting: Dermatology
